# Patient Record
Sex: Female | Born: 1940 | Race: Black or African American | Hispanic: No | Marital: Married | Smoking: Current every day smoker
Health system: Southern US, Community
[De-identification: ages and names within clinical notes are randomized; demographics above are authoritative.]

## PROBLEM LIST (undated history)

## (undated) DIAGNOSIS — I10 Essential (primary) hypertension: Secondary | ICD-10-CM

## (undated) DIAGNOSIS — F0390 Unspecified dementia without behavioral disturbance: Secondary | ICD-10-CM

## (undated) DIAGNOSIS — I8290 Acute embolism and thrombosis of unspecified vein: Secondary | ICD-10-CM

## (undated) DIAGNOSIS — I219 Acute myocardial infarction, unspecified: Secondary | ICD-10-CM

## (undated) DIAGNOSIS — K219 Gastro-esophageal reflux disease without esophagitis: Secondary | ICD-10-CM

## (undated) DIAGNOSIS — E785 Hyperlipidemia, unspecified: Secondary | ICD-10-CM

## (undated) DIAGNOSIS — Z951 Presence of aortocoronary bypass graft: Secondary | ICD-10-CM

## (undated) DIAGNOSIS — I2510 Atherosclerotic heart disease of native coronary artery without angina pectoris: Secondary | ICD-10-CM

## (undated) HISTORY — PX: CARDIAC SURGERY: SHX584

## (undated) HISTORY — PX: SHOULDER SURGERY: SHX246

---

## 2002-06-19 ENCOUNTER — Inpatient Hospital Stay (HOSPITAL_COMMUNITY): Admission: EM | Admit: 2002-06-19 | Discharge: 2002-06-21 | Payer: Self-pay | Admitting: Cardiology

## 2002-06-20 ENCOUNTER — Encounter: Payer: Self-pay | Admitting: Cardiology

## 2002-11-26 ENCOUNTER — Other Ambulatory Visit: Payer: Self-pay

## 2003-01-03 ENCOUNTER — Other Ambulatory Visit: Payer: Self-pay

## 2003-10-13 ENCOUNTER — Other Ambulatory Visit: Payer: Self-pay

## 2004-02-02 ENCOUNTER — Emergency Department: Payer: Self-pay | Admitting: Emergency Medicine

## 2004-09-16 ENCOUNTER — Other Ambulatory Visit: Payer: Self-pay

## 2004-09-16 ENCOUNTER — Inpatient Hospital Stay: Payer: Self-pay | Admitting: Internal Medicine

## 2004-09-17 ENCOUNTER — Other Ambulatory Visit: Payer: Self-pay

## 2004-10-07 ENCOUNTER — Other Ambulatory Visit: Payer: Self-pay

## 2004-10-07 ENCOUNTER — Inpatient Hospital Stay: Payer: Self-pay | Admitting: Internal Medicine

## 2004-10-08 ENCOUNTER — Other Ambulatory Visit: Payer: Self-pay

## 2004-10-09 ENCOUNTER — Other Ambulatory Visit: Payer: Self-pay

## 2005-02-07 ENCOUNTER — Emergency Department: Payer: Self-pay | Admitting: Emergency Medicine

## 2005-06-09 ENCOUNTER — Other Ambulatory Visit: Payer: Self-pay

## 2005-06-09 ENCOUNTER — Emergency Department: Payer: Self-pay | Admitting: Emergency Medicine

## 2005-09-30 ENCOUNTER — Other Ambulatory Visit: Payer: Self-pay

## 2005-10-01 ENCOUNTER — Inpatient Hospital Stay: Payer: Self-pay | Admitting: Internal Medicine

## 2005-12-07 ENCOUNTER — Inpatient Hospital Stay: Payer: Self-pay | Admitting: Internal Medicine

## 2005-12-07 ENCOUNTER — Other Ambulatory Visit: Payer: Self-pay

## 2006-07-16 ENCOUNTER — Emergency Department: Payer: Self-pay | Admitting: Emergency Medicine

## 2006-07-16 ENCOUNTER — Other Ambulatory Visit: Payer: Self-pay

## 2006-09-08 ENCOUNTER — Emergency Department: Payer: Self-pay | Admitting: Emergency Medicine

## 2006-09-08 ENCOUNTER — Other Ambulatory Visit: Payer: Self-pay

## 2007-06-30 ENCOUNTER — Emergency Department: Payer: Self-pay | Admitting: Emergency Medicine

## 2007-07-09 ENCOUNTER — Ambulatory Visit: Payer: Self-pay | Admitting: Internal Medicine

## 2007-10-05 ENCOUNTER — Emergency Department: Payer: Self-pay | Admitting: Emergency Medicine

## 2007-10-05 ENCOUNTER — Other Ambulatory Visit: Payer: Self-pay

## 2008-09-04 ENCOUNTER — Ambulatory Visit: Payer: Self-pay | Admitting: Family Medicine

## 2009-02-25 ENCOUNTER — Ambulatory Visit: Payer: Self-pay | Admitting: Internal Medicine

## 2009-06-17 ENCOUNTER — Emergency Department: Payer: Self-pay | Admitting: Emergency Medicine

## 2009-07-14 ENCOUNTER — Ambulatory Visit: Payer: Self-pay | Admitting: Gastroenterology

## 2009-07-16 ENCOUNTER — Ambulatory Visit: Payer: Self-pay | Admitting: Gastroenterology

## 2009-09-09 ENCOUNTER — Ambulatory Visit: Payer: Self-pay | Admitting: Gastroenterology

## 2010-05-05 ENCOUNTER — Ambulatory Visit: Payer: Self-pay | Admitting: Family Medicine

## 2010-08-18 ENCOUNTER — Emergency Department: Payer: Self-pay | Admitting: Emergency Medicine

## 2010-10-07 ENCOUNTER — Ambulatory Visit: Payer: Self-pay | Admitting: Pain Medicine

## 2010-10-08 ENCOUNTER — Ambulatory Visit: Payer: Self-pay | Admitting: Pain Medicine

## 2010-10-20 ENCOUNTER — Ambulatory Visit: Payer: Self-pay | Admitting: Pain Medicine

## 2011-01-25 DIAGNOSIS — Z951 Presence of aortocoronary bypass graft: Secondary | ICD-10-CM

## 2011-01-25 HISTORY — DX: Presence of aortocoronary bypass graft: Z95.1

## 2011-02-08 LAB — URINALYSIS, COMPLETE
Blood: NEGATIVE
Ketone: NEGATIVE
Ph: 5 (ref 4.5–8.0)
Protein: NEGATIVE
RBC,UR: 1 /HPF (ref 0–5)
Specific Gravity: 1.021 (ref 1.003–1.030)
Squamous Epithelial: 1

## 2011-02-08 LAB — BASIC METABOLIC PANEL
Calcium, Total: 9 mg/dL (ref 8.5–10.1)
Chloride: 105 mmol/L (ref 98–107)
Co2: 24 mmol/L (ref 21–32)
EGFR (African American): 46 — ABNORMAL LOW
Osmolality: 288 (ref 275–301)

## 2011-02-08 LAB — CK TOTAL AND CKMB (NOT AT ARMC)
CK, Total: 59 U/L (ref 21–215)
CK, Total: 61 U/L (ref 21–215)
CK-MB: 0.6 ng/mL (ref 0.5–3.6)
CK-MB: 0.9 ng/mL (ref 0.5–3.6)

## 2011-02-08 LAB — CBC
HCT: 36.2 % (ref 35.0–47.0)
MCH: 33.6 pg (ref 26.0–34.0)
MCHC: 34.5 g/dL (ref 32.0–36.0)
MCV: 97 fL (ref 80–100)
Platelet: 239 10*3/uL (ref 150–440)
RDW: 11.9 % (ref 11.5–14.5)

## 2011-02-09 ENCOUNTER — Inpatient Hospital Stay: Payer: Self-pay | Admitting: Internal Medicine

## 2011-02-09 LAB — BASIC METABOLIC PANEL
Calcium, Total: 8.8 mg/dL (ref 8.5–10.1)
Co2: 26 mmol/L (ref 21–32)
Creatinine: 1.41 mg/dL — ABNORMAL HIGH (ref 0.60–1.30)
EGFR (African American): 47 — ABNORMAL LOW
EGFR (Non-African Amer.): 39 — ABNORMAL LOW
Glucose: 91 mg/dL (ref 65–99)
Osmolality: 283 (ref 275–301)
Sodium: 140 mmol/L (ref 136–145)

## 2011-02-09 LAB — CBC WITH DIFFERENTIAL/PLATELET
Basophil #: 0 10*3/uL (ref 0.0–0.1)
Basophil %: 0.6 %
Eosinophil %: 3.1 %
HGB: 10.4 g/dL — ABNORMAL LOW (ref 12.0–16.0)
Lymphocyte #: 1.4 10*3/uL (ref 1.0–3.6)
Lymphocyte %: 27.3 %
MCH: 33.5 pg (ref 26.0–34.0)
MCV: 99 fL (ref 80–100)
Monocyte %: 13.2 %
Neutrophil %: 55.8 %
Platelet: 195 10*3/uL (ref 150–440)
RBC: 3.12 10*6/uL — ABNORMAL LOW (ref 3.80–5.20)
RDW: 13.1 % (ref 11.5–14.5)

## 2011-02-09 LAB — MAGNESIUM: Magnesium: 2 mg/dL

## 2011-02-09 LAB — CK TOTAL AND CKMB (NOT AT ARMC)
CK, Total: 87 U/L
CK-MB: 1.1 ng/mL

## 2011-02-09 LAB — PROTIME-INR
INR: 0.9
Prothrombin Time: 12.4 secs (ref 11.5–14.7)

## 2011-02-09 LAB — TROPONIN I: Troponin-I: <0.02 ng/mL

## 2011-02-09 LAB — LIPID PANEL: Cholesterol: 139 mg/dL (ref 0–200)

## 2011-02-10 LAB — BASIC METABOLIC PANEL
Anion Gap: 11 (ref 7–16)
BUN: 19 mg/dL — ABNORMAL HIGH (ref 7–18)
Chloride: 107 mmol/L (ref 98–107)
Co2: 25 mmol/L (ref 21–32)
Creatinine: 1.22 mg/dL (ref 0.60–1.30)
EGFR (African American): 56 — ABNORMAL LOW
EGFR (Non-African Amer.): 46 — ABNORMAL LOW
Osmolality: 287 (ref 275–301)

## 2011-02-10 LAB — PROTIME-INR
INR: 0.9
Prothrombin Time: 12.7 secs (ref 11.5–14.7)

## 2011-02-11 LAB — BASIC METABOLIC PANEL
Anion Gap: 11 (ref 7–16)
Calcium, Total: 8.8 mg/dL (ref 8.5–10.1)
Chloride: 108 mmol/L — ABNORMAL HIGH (ref 98–107)
Co2: 27 mmol/L (ref 21–32)
Creatinine: 1.15 mg/dL (ref 0.60–1.30)
EGFR (African American): >60
Osmolality: 290 (ref 275–301)

## 2011-02-11 LAB — HEMOGLOBIN: HGB: 10.7 g/dL — ABNORMAL LOW (ref 12.0–16.0)

## 2011-03-25 ENCOUNTER — Ambulatory Visit: Payer: Self-pay | Admitting: Family Medicine

## 2011-05-12 ENCOUNTER — Ambulatory Visit: Payer: Self-pay | Admitting: Family Medicine

## 2011-05-17 ENCOUNTER — Encounter: Payer: Self-pay | Admitting: Internal Medicine

## 2011-05-25 ENCOUNTER — Encounter: Payer: Self-pay | Admitting: Internal Medicine

## 2011-06-25 ENCOUNTER — Encounter: Payer: Self-pay | Admitting: Internal Medicine

## 2011-07-25 ENCOUNTER — Encounter: Payer: Self-pay | Admitting: Internal Medicine

## 2011-08-18 ENCOUNTER — Emergency Department: Payer: Self-pay | Admitting: Emergency Medicine

## 2011-08-18 LAB — COMPREHENSIVE METABOLIC PANEL
Albumin: 4 g/dL (ref 3.4–5.0)
Anion Gap: 7 (ref 7–16)
BUN: 26 mg/dL — ABNORMAL HIGH (ref 7–18)
Calcium, Total: 9 mg/dL (ref 8.5–10.1)
Chloride: 107 mmol/L (ref 98–107)
EGFR (African American): 45 — ABNORMAL LOW
EGFR (Non-African Amer.): 39 — ABNORMAL LOW
Glucose: 95 mg/dL (ref 65–99)
Osmolality: 286 (ref 275–301)
Potassium: 4.3 mmol/L (ref 3.5–5.1)
SGOT(AST): 11 U/L — ABNORMAL LOW (ref 15–37)
Sodium: 141 mmol/L (ref 136–145)
Total Protein: 7.9 g/dL (ref 6.4–8.2)

## 2011-08-18 LAB — CBC
MCH: 32.4 pg (ref 26.0–34.0)
MCHC: 33.2 g/dL (ref 32.0–36.0)
Platelet: 199 10*3/uL (ref 150–440)

## 2011-08-18 LAB — URINALYSIS, COMPLETE
Blood: NEGATIVE
Ketone: NEGATIVE
Ph: 5 (ref 4.5–8.0)
Protein: NEGATIVE
Specific Gravity: 1.02 (ref 1.003–1.030)
WBC UR: 2 /HPF (ref 0–5)

## 2011-08-18 LAB — AMYLASE: Amylase: 36 U/L (ref 25–115)

## 2011-08-18 LAB — LIPASE, BLOOD: Lipase: 127 U/L (ref 73–393)

## 2011-08-25 ENCOUNTER — Encounter: Payer: Self-pay | Admitting: Internal Medicine

## 2011-09-16 ENCOUNTER — Emergency Department: Payer: Self-pay | Admitting: Emergency Medicine

## 2011-09-16 LAB — URINALYSIS, COMPLETE
Bacteria: NONE SEEN
Bilirubin,UR: NEGATIVE
Blood: NEGATIVE
Glucose,UR: NEGATIVE mg/dL (ref 0–75)
Ketone: NEGATIVE
Protein: NEGATIVE
RBC,UR: 2 /HPF (ref 0–5)
Specific Gravity: 1.019 (ref 1.003–1.030)
Transitional Epi: 1
WBC UR: 30 /HPF (ref 0–5)

## 2011-09-16 LAB — COMPREHENSIVE METABOLIC PANEL
Bilirubin,Total: 0.6 mg/dL (ref 0.2–1.0)
Calcium, Total: 8.9 mg/dL (ref 8.5–10.1)
Chloride: 107 mmol/L (ref 98–107)
Co2: 31 mmol/L (ref 21–32)
EGFR (African American): 44 — ABNORMAL LOW
EGFR (Non-African Amer.): 38 — ABNORMAL LOW
Potassium: 3.9 mmol/L (ref 3.5–5.1)
SGOT(AST): 16 U/L (ref 15–37)
Sodium: 142 mmol/L (ref 136–145)

## 2011-09-16 LAB — CBC
HCT: 37.4 % (ref 35.0–47.0)
MCH: 31.5 pg (ref 26.0–34.0)
MCHC: 32.6 g/dL (ref 32.0–36.0)
RBC: 3.87 10*6/uL (ref 3.80–5.20)
WBC: 5.4 10*3/uL (ref 3.6–11.0)

## 2011-09-17 LAB — URINE CULTURE

## 2011-10-28 ENCOUNTER — Inpatient Hospital Stay: Payer: Self-pay | Admitting: Internal Medicine

## 2011-10-28 LAB — TROPONIN I: Troponin-I: 0.03 ng/mL

## 2011-10-28 LAB — COMPREHENSIVE METABOLIC PANEL
Albumin: 4.1 g/dL (ref 3.4–5.0)
Alkaline Phosphatase: 129 U/L (ref 50–136)
BUN: 29 mg/dL — ABNORMAL HIGH (ref 7–18)
Creatinine: 1.96 mg/dL — ABNORMAL HIGH (ref 0.60–1.30)
EGFR (African American): 29 — ABNORMAL LOW
Glucose: 113 mg/dL — ABNORMAL HIGH (ref 65–99)
SGOT(AST): 20 U/L (ref 15–37)
SGPT (ALT): 17 U/L (ref 12–78)
Total Protein: 8 g/dL (ref 6.4–8.2)

## 2011-10-28 LAB — URINALYSIS, COMPLETE
Bilirubin,UR: NEGATIVE
Ketone: NEGATIVE
Leukocyte Esterase: NEGATIVE
Ph: 6 (ref 4.5–8.0)
RBC,UR: 3 /HPF (ref 0–5)
Squamous Epithelial: <1
WBC UR: NONE SEEN /HPF (ref 0–5)

## 2011-10-28 LAB — CBC
HCT: 41.9 % (ref 35.0–47.0)
HGB: 14.4 g/dL (ref 12.0–16.0)
MCH: 32.5 pg (ref 26.0–34.0)
MCHC: 34.2 g/dL (ref 32.0–36.0)
MCV: 95 fL (ref 80–100)
WBC: 3.7 10*3/uL (ref 3.6–11.0)

## 2011-10-28 LAB — CK TOTAL AND CKMB (NOT AT ARMC)
CK, Total: 350 U/L — ABNORMAL HIGH (ref 21–215)
CK-MB: 2.4 ng/mL (ref 0.5–3.6)

## 2011-10-28 LAB — PROTIME-INR
INR: 1
Prothrombin Time: 14 secs (ref 11.5–14.7)

## 2011-10-29 LAB — CBC WITH DIFFERENTIAL/PLATELET
Basophil #: 0 10*3/uL (ref 0.0–0.1)
Eosinophil #: 0.1 10*3/uL (ref 0.0–0.7)
HCT: 38.1 % (ref 35.0–47.0)
HGB: 13.5 g/dL (ref 12.0–16.0)
Lymphocyte #: 1.7 10*3/uL (ref 1.0–3.6)
Lymphocyte %: 39.6 %
MCHC: 35.5 g/dL (ref 32.0–36.0)
MCV: 95 fL (ref 80–100)
Monocyte #: 0.6 x10 3/mm (ref 0.2–0.9)
Monocyte %: 15.4 %
RBC: 4.03 10*6/uL (ref 3.80–5.20)
RDW: 13.2 % (ref 11.5–14.5)

## 2011-10-29 LAB — LIPID PANEL
Cholesterol: 151 mg/dL (ref 0–200)
HDL Cholesterol: 41 mg/dL (ref 40–60)
Ldl Cholesterol, Calc: 89 mg/dL (ref 0–100)
Triglycerides: 107 mg/dL (ref 0–200)
VLDL Cholesterol, Calc: 21 mg/dL (ref 5–40)

## 2011-10-29 LAB — MAGNESIUM: Magnesium: 2.2 mg/dL

## 2011-10-29 LAB — CK TOTAL AND CKMB (NOT AT ARMC): CK-MB: 3.7 ng/mL — ABNORMAL HIGH (ref 0.5–3.6)

## 2011-10-29 LAB — BASIC METABOLIC PANEL
BUN: 27 mg/dL — ABNORMAL HIGH (ref 7–18)
Co2: 30 mmol/L (ref 21–32)
Creatinine: 1.68 mg/dL — ABNORMAL HIGH (ref 0.60–1.30)
EGFR (African American): 35 — ABNORMAL LOW
EGFR (Non-African Amer.): 30 — ABNORMAL LOW
Glucose: 95 mg/dL (ref 65–99)
Potassium: 3.4 mmol/L — ABNORMAL LOW (ref 3.5–5.1)
Sodium: 142 mmol/L (ref 136–145)

## 2011-10-29 LAB — TROPONIN I: Troponin-I: 0.03 ng/mL

## 2011-10-30 LAB — BASIC METABOLIC PANEL
Anion Gap: 9 (ref 7–16)
BUN: 23 mg/dL — ABNORMAL HIGH (ref 7–18)
Chloride: 102 mmol/L (ref 98–107)
Creatinine: 1.76 mg/dL — ABNORMAL HIGH (ref 0.60–1.30)
EGFR (Non-African Amer.): 29 — ABNORMAL LOW
Glucose: 110 mg/dL — ABNORMAL HIGH (ref 65–99)
Osmolality: 282 (ref 275–301)
Potassium: 3.8 mmol/L (ref 3.5–5.1)

## 2011-11-27 ENCOUNTER — Emergency Department: Payer: Self-pay | Admitting: Emergency Medicine

## 2011-11-27 LAB — COMPREHENSIVE METABOLIC PANEL
Alkaline Phosphatase: 116 U/L (ref 50–136)
Bilirubin,Total: 0.3 mg/dL (ref 0.2–1.0)
Calcium, Total: 8.7 mg/dL (ref 8.5–10.1)
Chloride: 110 mmol/L — ABNORMAL HIGH (ref 98–107)
Co2: 27 mmol/L (ref 21–32)
Creatinine: 1.34 mg/dL — ABNORMAL HIGH (ref 0.60–1.30)
EGFR (African American): 46 — ABNORMAL LOW
EGFR (Non-African Amer.): 40 — ABNORMAL LOW
Glucose: 92 mg/dL (ref 65–99)
Osmolality: 286 (ref 275–301)
SGPT (ALT): 15 U/L (ref 12–78)
Sodium: 143 mmol/L (ref 136–145)

## 2011-11-27 LAB — CK TOTAL AND CKMB (NOT AT ARMC): CK, Total: 68 U/L (ref 21–215)

## 2011-11-27 LAB — CBC
MCHC: 33.7 g/dL (ref 32.0–36.0)
MCV: 98 fL (ref 80–100)
Platelet: 188 10*3/uL (ref 150–440)
RBC: 3.96 10*6/uL (ref 3.80–5.20)
RDW: 13.8 % (ref 11.5–14.5)

## 2011-11-27 LAB — PRO B NATRIURETIC PEPTIDE: B-Type Natriuretic Peptide: 2894 pg/mL — ABNORMAL HIGH (ref 0–125)

## 2011-11-27 LAB — TROPONIN I
Troponin-I: <0.02 ng/mL
Troponin-I: <0.02 ng/mL

## 2011-12-10 ENCOUNTER — Observation Stay: Payer: Self-pay | Admitting: Internal Medicine

## 2011-12-10 LAB — CBC
HCT: 37.4 % (ref 35.0–47.0)
HGB: 12.6 g/dL (ref 12.0–16.0)
MCV: 98 fL (ref 80–100)
Platelet: 176 10*3/uL (ref 150–440)
RBC: 3.81 10*6/uL (ref 3.80–5.20)
RDW: 14 % (ref 11.5–14.5)
WBC: 3.2 10*3/uL — ABNORMAL LOW (ref 3.6–11.0)

## 2011-12-10 LAB — BASIC METABOLIC PANEL
BUN: 19 mg/dL — ABNORMAL HIGH (ref 7–18)
Calcium, Total: 8.7 mg/dL (ref 8.5–10.1)
Co2: 26 mmol/L (ref 21–32)
Creatinine: 1.32 mg/dL — ABNORMAL HIGH (ref 0.60–1.30)
EGFR (African American): 47 — ABNORMAL LOW
EGFR (Non-African Amer.): 41 — ABNORMAL LOW
Glucose: 94 mg/dL (ref 65–99)
Osmolality: 283 (ref 275–301)
Sodium: 141 mmol/L (ref 136–145)

## 2011-12-10 LAB — TROPONIN I
Troponin-I: <0.02 ng/mL
Troponin-I: <0.02 ng/mL
Troponin-I: <0.02 ng/mL

## 2011-12-10 LAB — CK TOTAL AND CKMB (NOT AT ARMC)
CK, Total: 63 U/L (ref 21–215)
CK, Total: 64 U/L (ref 21–215)
CK-MB: 0.9 ng/mL (ref 0.5–3.6)
CK-MB: 1 ng/mL (ref 0.5–3.6)
CK-MB: 1.3 ng/mL (ref 0.5–3.6)

## 2011-12-10 LAB — PRO B NATRIURETIC PEPTIDE: B-Type Natriuretic Peptide: 1451 pg/mL — ABNORMAL HIGH (ref 0–125)

## 2012-05-05 ENCOUNTER — Emergency Department: Payer: Self-pay | Admitting: Emergency Medicine

## 2012-05-06 LAB — CBC
HGB: 12.3 g/dL (ref 12.0–16.0)
MCH: 32 pg (ref 26.0–34.0)
MCHC: 32.2 g/dL (ref 32.0–36.0)
Platelet: 189 10*3/uL (ref 150–440)
WBC: 3.6 10*3/uL (ref 3.6–11.0)

## 2012-05-06 LAB — URINALYSIS, COMPLETE
Glucose,UR: NEGATIVE mg/dL (ref 0–75)
Ketone: NEGATIVE
Nitrite: NEGATIVE
Ph: 5 (ref 4.5–8.0)
Protein: NEGATIVE
RBC,UR: 2 /HPF (ref 0–5)
Squamous Epithelial: 2
WBC UR: 30 /HPF (ref 0–5)

## 2012-05-06 LAB — COMPREHENSIVE METABOLIC PANEL
Alkaline Phosphatase: 83 U/L (ref 50–136)
Anion Gap: 8 (ref 7–16)
Bilirubin,Total: 0.4 mg/dL (ref 0.2–1.0)
Calcium, Total: 8.6 mg/dL (ref 8.5–10.1)
Co2: 25 mmol/L (ref 21–32)
Osmolality: 284 (ref 275–301)
Potassium: 4 mmol/L (ref 3.5–5.1)
SGOT(AST): 13 U/L — ABNORMAL LOW (ref 15–37)
Sodium: 141 mmol/L (ref 136–145)
Total Protein: 7 g/dL (ref 6.4–8.2)

## 2012-05-18 ENCOUNTER — Ambulatory Visit: Payer: Self-pay | Admitting: Family Medicine

## 2012-06-13 ENCOUNTER — Observation Stay: Payer: Self-pay | Admitting: Internal Medicine

## 2012-06-13 LAB — CK TOTAL AND CKMB (NOT AT ARMC)
CK, Total: 110 U/L (ref 21–215)
CK-MB: <0.5 ng/mL — ABNORMAL LOW (ref 0.5–3.6)
CK-MB: <0.5 ng/mL — ABNORMAL LOW (ref 0.5–3.6)

## 2012-06-13 LAB — CBC
HCT: 36 % (ref 35.0–47.0)
MCHC: 33.6 g/dL (ref 32.0–36.0)
MCV: 97 fL (ref 80–100)
Platelet: 190 10*3/uL (ref 150–440)
RDW: 13 % (ref 11.5–14.5)
WBC: 3.8 10*3/uL (ref 3.6–11.0)

## 2012-06-13 LAB — COMPREHENSIVE METABOLIC PANEL
Alkaline Phosphatase: 81 U/L (ref 50–136)
Anion Gap: 7 (ref 7–16)
Bilirubin,Total: 0.5 mg/dL (ref 0.2–1.0)
Calcium, Total: 8.6 mg/dL (ref 8.5–10.1)
Chloride: 107 mmol/L (ref 98–107)
Co2: 26 mmol/L (ref 21–32)
Creatinine: 1.46 mg/dL — ABNORMAL HIGH (ref 0.60–1.30)
EGFR (African American): 42 — ABNORMAL LOW
EGFR (Non-African Amer.): 36 — ABNORMAL LOW
Glucose: 102 mg/dL — ABNORMAL HIGH (ref 65–99)
Osmolality: 283 (ref 275–301)
SGOT(AST): 15 U/L (ref 15–37)
SGPT (ALT): 14 U/L (ref 12–78)

## 2012-06-13 LAB — TROPONIN I
Troponin-I: <0.02 ng/mL
Troponin-I: <0.02 ng/mL

## 2012-06-14 LAB — BASIC METABOLIC PANEL
Anion Gap: 6 — ABNORMAL LOW (ref 7–16)
BUN: 23 mg/dL — ABNORMAL HIGH (ref 7–18)
Calcium, Total: 9 mg/dL (ref 8.5–10.1)
Chloride: 108 mmol/L — ABNORMAL HIGH (ref 98–107)
Creatinine: 1.49 mg/dL — ABNORMAL HIGH (ref 0.60–1.30)
EGFR (Non-African Amer.): 35 — ABNORMAL LOW
Glucose: 89 mg/dL (ref 65–99)
Potassium: 3.9 mmol/L (ref 3.5–5.1)
Sodium: 140 mmol/L (ref 136–145)

## 2012-06-14 LAB — TROPONIN I: Troponin-I: <0.02 ng/mL

## 2012-06-14 LAB — CK TOTAL AND CKMB (NOT AT ARMC)
CK, Total: 107 U/L (ref 21–215)
CK-MB: 0.7 ng/mL (ref 0.5–3.6)

## 2012-06-14 LAB — LIPID PANEL
Ldl Cholesterol, Calc: 90 mg/dL (ref 0–100)
Triglycerides: 59 mg/dL (ref 0–200)

## 2012-08-01 ENCOUNTER — Ambulatory Visit: Payer: Self-pay | Admitting: Family Medicine

## 2012-10-18 ENCOUNTER — Observation Stay: Payer: Self-pay | Admitting: Internal Medicine

## 2012-10-18 LAB — CBC
HGB: 11.9 g/dL — ABNORMAL LOW (ref 12.0–16.0)
MCH: 32.8 pg (ref 26.0–34.0)
MCV: 97 fL (ref 80–100)
Platelet: 212 10*3/uL (ref 150–440)
RBC: 3.63 10*6/uL — ABNORMAL LOW (ref 3.80–5.20)

## 2012-10-18 LAB — COMPREHENSIVE METABOLIC PANEL
Bilirubin,Total: 0.4 mg/dL (ref 0.2–1.0)
Calcium, Total: 9 mg/dL (ref 8.5–10.1)
Chloride: 109 mmol/L — ABNORMAL HIGH (ref 98–107)
Glucose: 99 mg/dL (ref 65–99)
Osmolality: 281 (ref 275–301)
SGOT(AST): 19 U/L (ref 15–37)
SGPT (ALT): 13 U/L (ref 12–78)
Sodium: 140 mmol/L (ref 136–145)
Total Protein: 6.9 g/dL (ref 6.4–8.2)

## 2012-10-18 LAB — TROPONIN I: Troponin-I: <0.02 ng/mL

## 2012-10-18 LAB — T4, FREE: Free Thyroxine: 1.21 ng/dL (ref 0.76–1.46)

## 2012-10-19 LAB — CK TOTAL AND CKMB (NOT AT ARMC)
CK-MB: 0.7 ng/mL (ref 0.5–3.6)
CK-MB: 0.7 ng/mL (ref 0.5–3.6)

## 2012-10-19 LAB — TROPONIN I
Troponin-I: <0.02 ng/mL
Troponin-I: <0.02 ng/mL

## 2012-10-19 LAB — LIPID PANEL: HDL Cholesterol: 44 mg/dL (ref 40–60)

## 2012-11-06 ENCOUNTER — Inpatient Hospital Stay: Payer: Self-pay | Admitting: Family Medicine

## 2012-11-06 LAB — BASIC METABOLIC PANEL
Anion Gap: 5 — ABNORMAL LOW (ref 7–16)
BUN: 18 mg/dL (ref 7–18)
Calcium, Total: 8.9 mg/dL (ref 8.5–10.1)
Osmolality: 278 (ref 275–301)
Potassium: 3.9 mmol/L (ref 3.5–5.1)
Sodium: 138 mmol/L (ref 136–145)

## 2012-11-06 LAB — CBC
HCT: 35.4 % (ref 35.0–47.0)
HGB: 12.1 g/dL (ref 12.0–16.0)
Platelet: 205 10*3/uL (ref 150–440)
RBC: 3.66 10*6/uL — ABNORMAL LOW (ref 3.80–5.20)

## 2012-11-06 LAB — TROPONIN I
Troponin-I: <0.02 ng/mL
Troponin-I: <0.02 ng/mL

## 2012-11-06 LAB — PROTIME-INR
INR: 1
Prothrombin Time: 13.3 secs (ref 11.5–14.7)

## 2012-11-06 LAB — CK TOTAL AND CKMB (NOT AT ARMC)
CK-MB: 0.8 ng/mL (ref 0.5–3.6)
CK-MB: 0.8 ng/mL (ref 0.5–3.6)

## 2012-11-07 LAB — CK TOTAL AND CKMB (NOT AT ARMC)
CK, Total: 46 U/L
CK-MB: 0.8 ng/mL

## 2012-11-07 LAB — TROPONIN I: Troponin-I: <0.02 ng/mL

## 2012-11-08 LAB — BASIC METABOLIC PANEL
Anion Gap: 2 — ABNORMAL LOW (ref 7–16)
EGFR (African American): 38 — ABNORMAL LOW
EGFR (Non-African Amer.): 33 — ABNORMAL LOW
Osmolality: 278 (ref 275–301)

## 2012-11-09 LAB — BASIC METABOLIC PANEL WITH GFR
Anion Gap: 4 — ABNORMAL LOW
BUN: 13 mg/dL
Calcium, Total: 8.3 mg/dL — ABNORMAL LOW
Chloride: 104 mmol/L
Co2: 31 mmol/L
Creatinine: 1.28 mg/dL
EGFR (African American): 49 — ABNORMAL LOW
EGFR (Non-African Amer.): 42 — ABNORMAL LOW
Glucose: 95 mg/dL
Osmolality: 277
Potassium: 3.9 mmol/L
Sodium: 139 mmol/L

## 2012-11-09 LAB — CK TOTAL AND CKMB (NOT AT ARMC)
CK, Total: 85 U/L
CK-MB: 1.5 ng/mL

## 2013-05-06 ENCOUNTER — Emergency Department: Payer: Self-pay | Admitting: Emergency Medicine

## 2013-05-06 LAB — TROPONIN I

## 2013-05-06 LAB — CBC WITH DIFFERENTIAL/PLATELET
BASOS ABS: 0 10*3/uL (ref 0.0–0.1)
Basophil %: 0.9 %
Eosinophil #: 0.1 10*3/uL (ref 0.0–0.7)
Eosinophil %: 2 %
HCT: 39.2 % (ref 35.0–47.0)
HGB: 13.2 g/dL (ref 12.0–16.0)
Lymphocyte #: 1.3 10*3/uL (ref 1.0–3.6)
Lymphocyte %: 34.1 %
MCH: 32.8 pg (ref 26.0–34.0)
MCHC: 33.6 g/dL (ref 32.0–36.0)
MCV: 98 fL (ref 80–100)
MONO ABS: 0.5 x10 3/mm (ref 0.2–0.9)
Monocyte %: 13.7 %
Neutrophil #: 1.9 10*3/uL (ref 1.4–6.5)
Neutrophil %: 49.3 %
PLATELETS: 232 10*3/uL (ref 150–440)
RBC: 4.02 10*6/uL (ref 3.80–5.20)
RDW: 13.8 % (ref 11.5–14.5)
WBC: 3.8 10*3/uL (ref 3.6–11.0)

## 2013-05-06 LAB — COMPREHENSIVE METABOLIC PANEL
ALBUMIN: 3.8 g/dL (ref 3.4–5.0)
ALT: 15 U/L (ref 12–78)
ANION GAP: 6 — AB (ref 7–16)
Alkaline Phosphatase: 84 U/L
BUN: 29 mg/dL — AB (ref 7–18)
Bilirubin,Total: 0.4 mg/dL (ref 0.2–1.0)
CALCIUM: 9 mg/dL (ref 8.5–10.1)
CHLORIDE: 104 mmol/L (ref 98–107)
CREATININE: 1.59 mg/dL — AB (ref 0.60–1.30)
Co2: 27 mmol/L (ref 21–32)
EGFR (Non-African Amer.): 32 — ABNORMAL LOW
GFR CALC AF AMER: 37 — AB
GLUCOSE: 109 mg/dL — AB (ref 65–99)
Osmolality: 280 (ref 275–301)
Potassium: 3.9 mmol/L (ref 3.5–5.1)
SGOT(AST): 20 U/L (ref 15–37)
Sodium: 137 mmol/L (ref 136–145)
Total Protein: 7.4 g/dL (ref 6.4–8.2)

## 2013-05-06 LAB — LIPASE, BLOOD: Lipase: 125 U/L (ref 73–393)

## 2013-05-16 ENCOUNTER — Emergency Department: Payer: Self-pay | Admitting: Internal Medicine

## 2013-05-16 LAB — COMPREHENSIVE METABOLIC PANEL
ALK PHOS: 83 U/L
AST: 17 U/L (ref 15–37)
Albumin: 3.7 g/dL (ref 3.4–5.0)
Anion Gap: 4 — ABNORMAL LOW (ref 7–16)
BUN: 17 mg/dL (ref 7–18)
Bilirubin,Total: 0.6 mg/dL (ref 0.2–1.0)
CHLORIDE: 106 mmol/L (ref 98–107)
Calcium, Total: 8.7 mg/dL (ref 8.5–10.1)
Co2: 27 mmol/L (ref 21–32)
Creatinine: 1.24 mg/dL (ref 0.60–1.30)
GFR CALC AF AMER: 50 — AB
GFR CALC NON AF AMER: 43 — AB
Glucose: 98 mg/dL (ref 65–99)
OSMOLALITY: 275 (ref 275–301)
POTASSIUM: 4.1 mmol/L (ref 3.5–5.1)
SGPT (ALT): 14 U/L (ref 12–78)
SODIUM: 137 mmol/L (ref 136–145)
Total Protein: 7.4 g/dL (ref 6.4–8.2)

## 2013-05-16 LAB — URINALYSIS, COMPLETE
Bacteria: NONE SEEN
Bilirubin,UR: NEGATIVE
Blood: NEGATIVE
Glucose,UR: NEGATIVE mg/dL (ref 0–75)
Hyaline Cast: 8
KETONE: NEGATIVE
Nitrite: NEGATIVE
Ph: 5 (ref 4.5–8.0)
Protein: NEGATIVE
RBC,UR: NONE SEEN /HPF (ref 0–5)
Specific Gravity: 1.006 (ref 1.003–1.030)
Squamous Epithelial: 1
WBC UR: 1 /HPF (ref 0–5)

## 2013-05-16 LAB — TROPONIN I: Troponin-I: <0.02 ng/mL

## 2013-05-16 LAB — CBC
HCT: 36.5 % (ref 35.0–47.0)
HGB: 12.4 g/dL (ref 12.0–16.0)
MCH: 33.1 pg (ref 26.0–34.0)
MCHC: 33.9 g/dL (ref 32.0–36.0)
MCV: 98 fL (ref 80–100)
PLATELETS: 208 10*3/uL (ref 150–440)
RBC: 3.73 10*6/uL — ABNORMAL LOW (ref 3.80–5.20)
RDW: 13.4 % (ref 11.5–14.5)
WBC: 3.7 10*3/uL (ref 3.6–11.0)

## 2013-06-04 ENCOUNTER — Ambulatory Visit: Payer: Self-pay | Admitting: Family Medicine

## 2013-06-08 ENCOUNTER — Inpatient Hospital Stay: Payer: Self-pay | Admitting: Internal Medicine

## 2013-06-08 LAB — COMPREHENSIVE METABOLIC PANEL
ALBUMIN: 3.5 g/dL (ref 3.4–5.0)
ANION GAP: 7 (ref 7–16)
Alkaline Phosphatase: 82 U/L
BILIRUBIN TOTAL: 0.5 mg/dL (ref 0.2–1.0)
BUN: 18 mg/dL (ref 7–18)
CHLORIDE: 107 mmol/L (ref 98–107)
Calcium, Total: 9.1 mg/dL (ref 8.5–10.1)
Co2: 24 mmol/L (ref 21–32)
Creatinine: 1.36 mg/dL — ABNORMAL HIGH (ref 0.60–1.30)
EGFR (African American): 45 — ABNORMAL LOW
EGFR (Non-African Amer.): 39 — ABNORMAL LOW
Glucose: 109 mg/dL — ABNORMAL HIGH (ref 65–99)
OSMOLALITY: 278 (ref 275–301)
POTASSIUM: 3.9 mmol/L (ref 3.5–5.1)
SGOT(AST): 22 U/L (ref 15–37)
SGPT (ALT): 15 U/L (ref 12–78)
SODIUM: 138 mmol/L (ref 136–145)
TOTAL PROTEIN: 7.1 g/dL (ref 6.4–8.2)

## 2013-06-08 LAB — URINALYSIS, COMPLETE
BACTERIA: NONE SEEN
BILIRUBIN, UR: NEGATIVE
BLOOD: NEGATIVE
Glucose,UR: NEGATIVE mg/dL (ref 0–75)
KETONE: NEGATIVE
Nitrite: NEGATIVE
Ph: 5 (ref 4.5–8.0)
RBC,UR: 4 /HPF (ref 0–5)
Specific Gravity: 1.024 (ref 1.003–1.030)
Squamous Epithelial: 11
WBC UR: 52 /HPF (ref 0–5)

## 2013-06-08 LAB — LIPASE, BLOOD: Lipase: 76 U/L (ref 73–393)

## 2013-06-08 LAB — TSH: THYROID STIMULATING HORM: 0.359 u[IU]/mL — AB

## 2013-06-08 LAB — CBC
HCT: 37.9 % (ref 35.0–47.0)
HGB: 12.7 g/dL (ref 12.0–16.0)
MCH: 32.9 pg (ref 26.0–34.0)
MCHC: 33.4 g/dL (ref 32.0–36.0)
MCV: 99 fL (ref 80–100)
PLATELETS: 192 10*3/uL (ref 150–440)
RBC: 3.85 10*6/uL (ref 3.80–5.20)
RDW: 13.4 % (ref 11.5–14.5)
WBC: 3.7 10*3/uL (ref 3.6–11.0)

## 2013-06-08 LAB — TROPONIN I: Troponin-I: 0.02 ng/mL

## 2013-06-09 LAB — COMPREHENSIVE METABOLIC PANEL
ANION GAP: 6 — AB (ref 7–16)
Albumin: 3 g/dL — ABNORMAL LOW (ref 3.4–5.0)
Alkaline Phosphatase: 65 U/L
BILIRUBIN TOTAL: 0.3 mg/dL (ref 0.2–1.0)
BUN: 14 mg/dL (ref 7–18)
CREATININE: 1.36 mg/dL — AB (ref 0.60–1.30)
Calcium, Total: 8.4 mg/dL — ABNORMAL LOW (ref 8.5–10.1)
Chloride: 109 mmol/L — ABNORMAL HIGH (ref 98–107)
Co2: 26 mmol/L (ref 21–32)
EGFR (African American): 45 — ABNORMAL LOW
EGFR (Non-African Amer.): 39 — ABNORMAL LOW
Glucose: 81 mg/dL (ref 65–99)
Osmolality: 281 (ref 275–301)
POTASSIUM: 4 mmol/L (ref 3.5–5.1)
SGOT(AST): 21 U/L (ref 15–37)
SGPT (ALT): 15 U/L (ref 12–78)
SODIUM: 141 mmol/L (ref 136–145)
Total Protein: 6.1 g/dL — ABNORMAL LOW (ref 6.4–8.2)

## 2013-06-09 LAB — TROPONIN I
TROPONIN-I: 0.03 ng/mL
Troponin-I: 0.03 ng/mL
Troponin-I: <0.02 ng/mL

## 2013-06-10 LAB — URINE CULTURE

## 2013-06-11 LAB — COMPREHENSIVE METABOLIC PANEL
ALBUMIN: 3.1 g/dL — AB (ref 3.4–5.0)
ALK PHOS: 61 U/L
ANION GAP: 4 — AB (ref 7–16)
AST: 23 U/L (ref 15–37)
BUN: 13 mg/dL (ref 7–18)
Bilirubin,Total: 0.3 mg/dL (ref 0.2–1.0)
CALCIUM: 8.4 mg/dL — AB (ref 8.5–10.1)
CHLORIDE: 106 mmol/L (ref 98–107)
CREATININE: 1.25 mg/dL (ref 0.60–1.30)
Co2: 27 mmol/L (ref 21–32)
EGFR (Non-African Amer.): 43 — ABNORMAL LOW
GFR CALC AF AMER: 50 — AB
Glucose: 134 mg/dL — ABNORMAL HIGH (ref 65–99)
Osmolality: 276 (ref 275–301)
Potassium: 4.2 mmol/L (ref 3.5–5.1)
SGPT (ALT): 18 U/L (ref 12–78)
Sodium: 137 mmol/L (ref 136–145)
Total Protein: 6 g/dL — ABNORMAL LOW (ref 6.4–8.2)

## 2013-06-13 LAB — T4, FREE: Free Thyroxine: 1.08 ng/dL (ref 0.76–1.46)

## 2013-06-13 LAB — PATHOLOGY REPORT

## 2013-06-13 LAB — TSH: Thyroid Stimulating Horm: 2.36 u[IU]/mL

## 2013-06-20 ENCOUNTER — Ambulatory Visit: Payer: Self-pay | Admitting: Gastroenterology

## 2013-06-25 ENCOUNTER — Ambulatory Visit: Payer: Self-pay | Admitting: Family Medicine

## 2013-06-29 ENCOUNTER — Ambulatory Visit: Payer: Self-pay | Admitting: Family Medicine

## 2013-07-18 ENCOUNTER — Ambulatory Visit: Payer: Self-pay | Admitting: Gastroenterology

## 2013-08-03 ENCOUNTER — Emergency Department: Payer: Self-pay | Admitting: Emergency Medicine

## 2013-08-03 LAB — CK TOTAL AND CKMB (NOT AT ARMC): CK, Total: 38 U/L

## 2013-08-03 LAB — COMPREHENSIVE METABOLIC PANEL
ALBUMIN: 3.3 g/dL — AB (ref 3.4–5.0)
AST: 6 U/L — AB (ref 15–37)
Alkaline Phosphatase: 78 U/L
Anion Gap: 7 (ref 7–16)
BILIRUBIN TOTAL: 0.3 mg/dL (ref 0.2–1.0)
BUN: 19 mg/dL — ABNORMAL HIGH (ref 7–18)
CALCIUM: 8.4 mg/dL — AB (ref 8.5–10.1)
Chloride: 105 mmol/L (ref 98–107)
Co2: 27 mmol/L (ref 21–32)
Creatinine: 1.39 mg/dL — ABNORMAL HIGH (ref 0.60–1.30)
EGFR (Non-African Amer.): 38 — ABNORMAL LOW
GFR CALC AF AMER: 44 — AB
GLUCOSE: 102 mg/dL — AB (ref 65–99)
Osmolality: 280 (ref 275–301)
Potassium: 4.2 mmol/L (ref 3.5–5.1)
SGPT (ALT): 10 U/L — ABNORMAL LOW (ref 12–78)
Sodium: 139 mmol/L (ref 136–145)
TOTAL PROTEIN: 6.6 g/dL (ref 6.4–8.2)

## 2013-08-03 LAB — CBC
HCT: 35.6 % (ref 35.0–47.0)
HGB: 11.7 g/dL — AB (ref 12.0–16.0)
MCH: 33.2 pg (ref 26.0–34.0)
MCHC: 33 g/dL (ref 32.0–36.0)
MCV: 101 fL — AB (ref 80–100)
Platelet: 213 10*3/uL (ref 150–440)
RBC: 3.53 10*6/uL — AB (ref 3.80–5.20)
RDW: 13.1 % (ref 11.5–14.5)
WBC: 3.3 10*3/uL — ABNORMAL LOW (ref 3.6–11.0)

## 2013-08-03 LAB — TROPONIN I
Troponin-I: <0.02 ng/mL
Troponin-I: <0.02 ng/mL

## 2013-09-18 ENCOUNTER — Ambulatory Visit: Payer: Self-pay | Admitting: Ophthalmology

## 2013-10-23 ENCOUNTER — Ambulatory Visit: Payer: Self-pay | Admitting: Ophthalmology

## 2013-12-17 ENCOUNTER — Emergency Department: Payer: Self-pay | Admitting: Emergency Medicine

## 2013-12-17 LAB — CBC
HCT: 39.1 % (ref 35.0–47.0)
HGB: 12.6 g/dL (ref 12.0–16.0)
MCH: 32.4 pg (ref 26.0–34.0)
MCHC: 32.2 g/dL (ref 32.0–36.0)
MCV: 101 fL — ABNORMAL HIGH (ref 80–100)
PLATELETS: 209 10*3/uL (ref 150–440)
RBC: 3.88 10*6/uL (ref 3.80–5.20)
RDW: 13 % (ref 11.5–14.5)
WBC: 4.1 10*3/uL (ref 3.6–11.0)

## 2013-12-17 LAB — COMPREHENSIVE METABOLIC PANEL
ALBUMIN: 3.7 g/dL (ref 3.4–5.0)
Alkaline Phosphatase: 92 U/L
Anion Gap: 4 — ABNORMAL LOW (ref 7–16)
BUN: 27 mg/dL — AB (ref 7–18)
Bilirubin,Total: 0.7 mg/dL (ref 0.2–1.0)
CHLORIDE: 105 mmol/L (ref 98–107)
CO2: 29 mmol/L (ref 21–32)
Calcium, Total: 8.6 mg/dL (ref 8.5–10.1)
Creatinine: 1.66 mg/dL — ABNORMAL HIGH (ref 0.60–1.30)
EGFR (African American): 39 — ABNORMAL LOW
GFR CALC NON AF AMER: 32 — AB
Glucose: 105 mg/dL — ABNORMAL HIGH (ref 65–99)
Osmolality: 281 (ref 275–301)
Potassium: 4.8 mmol/L (ref 3.5–5.1)
SGOT(AST): 20 U/L (ref 15–37)
SGPT (ALT): 18 U/L
SODIUM: 138 mmol/L (ref 136–145)
Total Protein: 7.2 g/dL (ref 6.4–8.2)

## 2013-12-17 LAB — PROTIME-INR
INR: 1
Prothrombin Time: 13.2 secs (ref 11.5–14.7)

## 2013-12-17 LAB — TROPONIN I

## 2013-12-17 LAB — APTT: Activated PTT: 26.4 secs (ref 23.6–35.9)

## 2014-01-31 ENCOUNTER — Ambulatory Visit: Payer: Self-pay | Admitting: Family Medicine

## 2014-03-25 ENCOUNTER — Emergency Department: Payer: Self-pay | Admitting: Internal Medicine

## 2014-05-13 NOTE — Consult Note (Signed)
PATIENT NAME:  [name], [name] MR#:  [id number] DATE OF BIRTH:  [birth year]  DATE OF CONSULTATION:  10/28/2011  REFERRING PHYSICIAN:   CONSULTING PHYSICIAN:  Dwayne D. Juliann Paresallwood, MD  PRIMARY CARE PHYSICIAN: Dr. Quillian QuinceBliss   INDICATION: Unstable angina and chest pain.   HISTORY OF PRESENT ILLNESS: Ms. Cynthia [name] is a 74 year old African American female with known history coronary artery disease, coronary bypass in January 2013, chronic renal insufficiency, transient ischemic attack, hypertension, borderline diabetes, hyperlipidemia presented with recurrent chest pain, angina, abnormal EKG with subtle ST changes. [facility] but now presents with recurrent chest pain, brought to the Emergency Room, treated with nitroglycerin x2 and she has had worsening symptoms of chest pain 5/10, initially was 10/10 but now 5/10 and got progressively worse, finally came to the Emergency Room.   REVIEW OF SYSTEMS: No blackout spells, syncope. No nausea, vomiting. No fever. No chills. No sweats. No weight loss. No weight gain, no hemoptysis, no hematemesis. No bright red blood per rectum. No vision change. No hearing change. Denies sputum production or cough.   PAST MEDICAL HISTORY:  1. Coronary artery disease. 2. Hypertension. 3. Hyperlipidemia.  4. Transient ischemic attack. 5. Chronic renal insufficiency.   PAST SURGICAL HISTORY:  1. Coronary artery stenting. 2. Left carotid endarterectomy.  3. Coronary artery bypass surgery.   MEDICATIONS:  1. Aspirin 81 mg a day.  2. Lasix 40 mg a day.  3. Metoprolol 12.5 twice a day.  4. Omeprazole 40 a day. 5. Percocet 10/650, 1 to 2 p.r.n.  6. Pravastatin 20 a day.  7. Promethazine 25 mg q.8 hours p.r.n.   ALLERGIES: Penicillin.   FAMILY HISTORY: Hypertension.   SOCIAL HISTORY: Ex-smoker. Retired. Lives with her husband. On disability. No alcohol consumption.   PHYSICAL EXAMINATION:  VITAL SIGNS: Blood pressure was elevated at 140/87, pulse  70, respiratory rate 16, afebrile.   HEENT: Normocephalic, atraumatic. Pupils reactive to light.   NECK: Supple. No jugular venous distention, bruits, adenopathy.   LUNGS: Clear to auscultation and percussion. No significant wheezing, rhonchi, rales.   HEART: Regular rate and rhythm. Positive bowel sounds. No rebound, guarding, tenderness.   EXTREMITIES: Within normal limits.   NEUROLOGIC: Examination is intact.   SKIN: Exam is normal.   LABORATORY, DIAGNOSTIC AND RADIOLOGICAL DATA: Creatinine 1.97, BUN 29, glucose 113, potassium 3.6, sodium 139. LFTs normal. Troponin negative. CK 350, platelet count 232. EKG: Normal sinus rhythm, right bundle branch block, subtle ST segment changes.   ASSESSMENT:  1. Unstable angina.  2. Coronary artery disease. 3. Renal insufficiency.  4. Hyperlipidemia.  5. Abnormal EKG. 6. Transient ischemic attack.  7. Peripheral vascular disease.   PLAN: Recommend direct cardiac catheterization for possible acute coronary syndrome. Hypertension control. Renal evaluation by nephrology. Will probably treat with bicarbonate and hydration prior to cardiac catheterization. Continue anticoagulation. Follow up with echocardiogram. Continue statin therapy. Will base further evaluation on results of the catheterization.  ____________________________ Bobbie Stackwayne D. Juliann Paresallwood, MD ddc:cms D: 10/29/2011 11:28:00 ET T: 10/29/2011 11:40:48 ET JOB#: [id number]  cc: Dwayne D. Juliann Paresallwood, MD, <Dictator> Alwyn PeaWAYNE D CALLWOOD MD ELECTRONICALLY SIGNED 12/02/2011 11:24

## 2014-05-13 NOTE — Discharge Summary (Signed)
PATIENT NAME:  Cynthia [name], Cynthia [name] MR#:  [id number] DATE OF BIRTH:  Sep 26, 1940  DATE OF ADMISSION:  10/28/2011 DATE OF DISCHARGE:  10/30/2011  DISCHARGE DIAGNOSES:  1. Unstable angina. 2. History of coronary artery disease. 3. Hypertension,  4. Hyperlipidemia.  5. Hypokalemia. 6. Chronic kidney disease. 7. Ischemic cardiomyopathy, ejection fraction 25%.   DISPOSITION: Patient is being discharged home.   FOLLOWUP: Follow up with Dr. Juliann Paresallwood and Dr. Cherylann RatelLateef in 1 to 2 weeks after discharge.   DIET: Low sodium.   ACTIVITY: As tolerated.   DISCHARGE MEDICATIONS:  1. Omeprazole 40 mg daily.  2. Aspirin 81 mg daily.  3. Percocet 10/650 mg 1 tablet twice a day as needed for pain.  4. Lopressor 12.5 mg b.i.d.  5. Promethazine 25 mg q.4-6 hours p.r.n.  6. Pravachol 20 mg daily.  7. Imdur 30 mg daily.  8. Nitroglycerin p.r.n.  9. Plavix 75 mg daily.   CONSULTATIONS:  1. Renal consultation with Dr. Mady HaagensenMunsoor Lateef. 2. Cardiology consultation with Dr. Juliann Paresallwood.   PROCEDURE: Catheterization which showed occlusion of patient's bypass graft.   LABORATORY, DIAGNOSTIC, AND RADIOLOGICAL DATA: Chest x-ray shows findings of low-grade congestive heart failure. No discrete infiltrate. Urinalysis showed no evidence of infection. CBC normal. Cardiac enzymes normal. VLDL 21, LDL 89, cholesterol 151, triglyceride 104, HDL 41, hemoglobin A1c 5.16, creatinine 1.96 to 1.68.   HOSPITAL COURSE: Patient is a 74 year old female with past medical history of coronary artery disease status post stenting who underwent coronary artery bypass graft in January 2013, hypertension, hyperlipidemia presented with chest pain and EKG changes. Her cardiac enzymes were normal. She underwent a catheterization by Dr. Juliann Paresallwood which showed occlusion of her graft. He recommended medical management with aspirin, Plavix, beta blocker and statin. Because of her chronic kidney disease and low GFR her ACE inhibitor has been discontinued.  Nephrology consultation with Dr. Cherylann RatelLateef was obtained given patient's chronic kidney disease and administration of contrast with catheterization. Patient received renal protection with Mucomyst. Her renal function was closely monitored. She had good urine output and her creatinine remained at her baseline and she had no evidence of contrast-induced nephropathy. She is being discharged home. She has been advised to avoid nephrotoxic medications. She is not a good candidate for ACE inhibitors and her Lasix has been discontinued and she will continue her statin therapy. Her LDL is 89. She had mild hypokalemia that was supplemented. She is being discharged home in a stable condition.   TIME SPENT: 45 minutes.   ____________________________ Darrick MeigsSangeeta Jihan Mellette, MD sp:cms D: 10/30/2011 10:18:16 ET T: 10/30/2011 10:59:41 ET JOB#: [id number]  cc: Darrick MeigsSangeeta Venesa Semidey, MD, <Dictator> Darrick MeigsSANGEETA Axl Rodino MD ELECTRONICALLY SIGNED 10/31/2011 7:14

## 2014-05-13 NOTE — Discharge Summary (Signed)
PATIENT NAME:  Cynthia [name], Cynthia [name] MR#:  [id number] DATE OF BIRTH:  1940/06/12  DATE OF ADMISSION:  12/10/2011 DATE OF DISCHARGE:  12/11/2011  PRESENTING COMPLAINT: Abdominal pain and nausea.   DISCHARGE DIAGNOSES:  1. Nausea and vomiting, resolved. Suspected gastritis versus viral syndrome.  2. Degenerative joint disease of left hip.  3. Coronary artery disease status post coronary artery bypass graft with history of occluded graft per last catheterization done October 2013.  4. Hyperlipidemia.  5. Hypertension.   CONDITION ON DISCHARGE: Fair.   CODE STATUS: FULL CODE.   DISCHARGE FOLLOWUP: Follow up with Dr. Joen LauraLaura Bliss in 1 to 2 weeks. Follow-up with Dr. Juliann Paresallwood, your cardiologist, on scheduled appointment.  DISCHARGE MEDICATIONS: 1. Zofran 4 mg three times daily as needed.  2. Clopidogrel 75 mg daily.  3. Nitroglycerin 0.4 mg sublingual as needed.  4. Aspirin 81 mg daily.  5. Percocet 10/650 mg one tablet twice a day as needed.  6. Metoprolol 12.5 mg twice a day. 7. Promethazine 25 mg one tablet every 6 to 8 hours as needed.  8. Tramadol 50 mg three times daily. 9. Omeprazole 40 mg daily.  10. Ranexa 500 mg extended-release twice a day. 11. Enalapril 2.5 mg daily.  12. Pravastatin 20 mg at bedtime.  13. Protonix 40 mg p.o. daily.  14. Cyclobenzaprine 5 mg three times daily as needed.  15. Imdur 30 mg extended-release one tablet in the morning.   DIET: Low fat, low cholesterol diet.   RESULTS: Cardiac enzymes x3 negative.   Abdominal x-ray: No acute abnormality.  Chest x-ray: No acute cardiopulmonary abnormality. Positive for CABG and cardiomegaly.   CBC within normal limits. BUN 19, creatinine 1.32, sodium 141, potassium 4.6, chloride 109, and bicarbonate 26. BNP is 1451.  HOSPITAL COURSE: Cynthia [name] is a pleasant 74 year old African American female who came to the emergency room with:  1. Nausea with vomiting. The patient had a couple of episodes of vomiting after being  admitted. Etiology could be either gastritis versus "viral syndrome".  The patient after having a couple of episodes of vomiting started feeling hungry and tolerated a regular diet prior to discharge. Her ultrasound of the abdomen in July of 2013 did not show any gallstones. She does not have any history of kidney stones either. LFTs are normal. Abdominal x-ray was normal. The patient also denied constipation.  2. Left hip degenerative joint disease. The patient has been complaining of some left hip pain while doing routine chores at work. She was recommended to take her Percocet p.r.n. along with Tylenol p.r.n. for degenerative joint disease.  3. Coronary artery disease with occluded grafts from last catheterization in October 2013. Medical management has been recommended by Dr. Juliann Paresallwood. The patient denied any chest pain. EKG did not show any new changes. Home medications were continued. Cardiac enzymes remained negative.  4. Hyperlipidemia. On statins.  5. Left hip pain and left back pain, likely degenerative joint disease. Percocet p.r.n. was prescribed.          Overall the patient felt back to baseline prior to discharge. Her hospital stay otherwise remained stable. The patient remained a FULL CODE.   TIME SPENT: 40 minutes.  ____________________________ Wylie HailSona A. Allena KatzPatel, MD sap:slb D: 12/12/2011 07:21:22 ET T: 12/12/2011 11:28:33 ET JOB#: [id number]  cc: [place] A. Allena KatzPatel, MD, <Dictator> Burley SaverL. Katherine Bliss, MD Dwayne D. Juliann Paresallwood, MD Willow OraSONA A Jorryn Hershberger MD ELECTRONICALLY SIGNED 12/15/2011 20:11

## 2014-05-13 NOTE — H&P (Signed)
PATIENT NAME:  Cynthia [name], Cynthia [name] MR#:  [id number] DATE OF BIRTH:  1940/04/29  DATE OF ADMISSION:  10/28/2011  PRIMARY CARE PHYSICIAN: Dr. Quillian Quince  CARDIOLOGIST: Dr. Juliann Pares REFERRING PHYSICIAN: Dr. Mindi Junker  CHIEF COMPLAINT: Chest pain.   HISTORY OF PRESENT ILLNESS: Patient is a pleasant 74 year old African American female with history of coronary artery disease status post CABG in 01/2011 at [facility], chronic renal failure, history of transient ischemic attack and hypertension who was in her usual state of self until this morning when developed chest pain. The pain initially started around her right shoulder, went to her back area. She does have history of rotator cuff injury and does have chronic shoulder pain, however, then developed substernal chest pain which was severe, greater 10/10 without any radiation. She did feel some shortness of breath and went to her PCP where she was give aspirin and nitroglycerin sublingual x2. EMS was called and patient was given another spray of nitroglycerin and here she was found with a negative troponin but still had chest pain. The EKG did show T wave inversions. The T wave inversions are leads in three and aVF but patient had some T wave inversions in the past, however, does have T wave inversions from V1 to V4, some these are new and up to 3 mm in depth. Patient was started on heparin drip and Dr. Juliann Pares was consulted. He would see the patient and the plan is for catheterization tomorrow. Currently patient states her pain is 5/10 and better than before.   PAST MEDICAL HISTORY:  1. Coronary artery disease, status post stenting in 2006 and CABG for depressed ejection fraction of 25% and multivessel coronary artery disease.  2. Hypertension.  3. Hyperlipidemia.  4. History of transient ischemic attack.  5. Chronic kidney disease.  PAST SURGICAL HISTORY:  1. Cardiac stenting in proximal RCA.  2. Left carotid endarterectomy.  3. CABG.   CURRENT MEDICATIONS:   1. Aspirin 81 mg daily.  2. Lasix 40 mg daily.   3. Metoprolol tartrate 12.5 mg 2 times a day.  4. Omeprazole 40 mg daily.  5. Percocet 10/650, 1 tab 2 times a day as needed for pain.  6. Pravastatin 20 mg daily.  7. Promethazine 25 mg every 6 to 8 hours as needed for nausea.   ALLERGIES: Penicillin.   SOCIAL HISTORY: Ex-smoker, quit earlier this year. Is on disability. No alcohol or drug use. Is married. Lives in [place].   FAMILY HISTORY: Raised by grandmother who had hypertension otherwise does not know.    REVIEW OF SYSTEMS: CONSTITUTIONAL: No fever, fatigue, weakness. No weight changes. EYES: No blurry vision or double vision. ENT: No tinnitus, hearing loss or snoring. RESPIRATORY: Denies cough, wheezing, hemoptysis, shortness of breath currently or chronic obstructive pulmonary disease. CARDIOVASCULAR: Denies any orthopnea or swelling in the legs. Does have chest pain substernal without radiation. No palpitations or syncope. GASTROINTESTINAL: No nausea, vomiting, diarrhea, abdominal pain, bloody stools or dark stools. GENITOURINARY: Denies dysuria, hematuria or frequency. HEME/LYMPH: No anemia or easy bruising. SKIN: No new rashes. MUSCULOSKELETAL: Chronic right shoulder pain. NEUROLOGIC: No numbness. History of transient ischemic attack. PSYCHIATRIC: No anxiety or insomnia.   PHYSICAL EXAMINATION:  VITAL SIGNS: Temperature on arrival 98.2, pulse 69, respiratory rate 16, blood pressure on arrival 119/77, last one 138/87, oxygen sats 100% on oxygen.   GENERAL: Patient is an obese African American female lying in bed, no obvious distress.   HEENT: Normocephalic, atraumatic. Pupils are equal and reactive. Anicteric sclerae. Moist mucous  membranes.   NECK: Supple. There is left side of the neck healed scar from previous carotid endarterectomy. No JVD.   CARDIOVASCULAR: S1, S2 regular rate and rhythm. No murmurs, rubs, or gallops. Decreased heart sounds.   LUNGS: Clear to auscultation  without wheezing or rhonchi. Good air entry. Good effort.   ABDOMEN: Soft, nontender, nondistended, obese. No organomegaly appreciated.   EXTREMITIES: No significant lower extremity edema.   NEUROLOGICAL: Cranial nerves II through XII grossly intact. Strength is 5/5 in all extremities.   SKIN: No obvious rashes.   PSYCH: Awake, alert, oriented x3, pleasant and cooperative.   LABORATORY, DIAGNOSTIC, AND RADIOLOGICAL DATA: Creatinine 1.96, was 1.39 on 09/16/2011. BUN 29, glucose 113, potassium 3.6, sodium 139, anion gap 12, GFR 29. LFTs within normal limits. Troponin negative. CK-MB 1.1. CK total elevated at 350, WBC 3.7, hemoglobin 14.4, platelets 232, INR 1. EKG normal sinus rhythm, incomplete right bundle branch block, anteroseptal T wave inversions from V1 to V4 up to about 3 mm. There is also T wave inversions in 3 and aVF. No acute ST elevations. X-ray of the chest pending.   ASSESSMENT AND PLAN: We have a pleasant 74 year old African American female with history of coronary artery disease status post stenting in 2006 and CABG earlier this year with ejection fraction of 35% and chronic systolic congestive heart failure, hypertension, hyperlipidemia status post left carotid endarterectomy who presents with acute onset chest pain. Patient does have some EKG changes but has negative troponin. Patient likely has unstable angina and at this point has been seen by Dr. Juliann Paresallwood and the plan is for cardiac catheterization. Would continue the heparin drip which has been started. The pain is better with nitroglycerin. Would continue sublingual and add nitro paste. Would give aspirin, start Plavix, resume statin and the beta blocker. Will cycle the troponins and check an echocardiogram. Would also check a lipid profile and hemoglobin A1c. Patient also does have acute on chronic renal failure with creatinine higher than baseline and she is going for a catheterization. Will obtain a nephrology consult. Patient  does see Dr. Thedore MinsSingh as an outpatient. We would hold the Lasix. In regards to depressed ejection fraction, patient likely has chronic systolic congestive heart failure and does not appear to be in a volume overload state currently. We would hold the Lasix given the renal failure. She has no significant lower extremity edema. We would obtain an echocardiogram. We would resume the beta blocker for high blood pressure and the statin for her hyperlipidemia.   TOTAL TIME SPENT: 45 minutes.  ____________________________ Krystal EatonShayiq Rupa Lagan, MD sa:cms D: 10/28/2011 12:49:33 ET T: 10/28/2011 13:14:31 ET JOB#: [id number]  cc: Krystal EatonShayiq Ticia Virgo, MD, <Dictator> Burley SaverL. Katherine Bliss, MD Dwayne D. Juliann Paresallwood, MD Mosetta PigeonHarmeet Singh, MD  Marcelle SmilingSHAYIQ [facility]ADZIA MD ELECTRONICALLY SIGNED 11/08/2011 17:57

## 2014-05-13 NOTE — H&P (Signed)
PATIENT NAME:  [name], [name] MR#:  [id number] DATE OF BIRTH:  02-21-1940  DATE OF ADMISSION:  12/10/2011  PRESENTING COMPLAINT: Nausea and abdominal pain.   HISTORY OF PRESENT ILLNESS: Cynthia [name] is a pleasant 74 year old Caucasian female with history of coronary artery disease status post CABG and has history of occluded grafts, medical management recommended, history of hypertension, hyperlipidemia comes to the Emergency Room accompanied by her husband with complains of nausea and generalized abdominal discomfort. Patient says she was doing well. She was just admitted, discharged October first week of 2013. She started having some chest discomfort yesterday evening around 4:00. No aggravating precipitating symptoms. Lasted about 10 minutes. She did not have further chest pain, however, started having some nausea and some abdominal discomfort which she says is more generalized. Denies constipation. Denies vomiting or diarrhea. She came to the Emergency Room, received some aspirin. Her EKG does not show any acute changes. Cardiac enzymes are negative. Patient is being admitted for further evaluation of her nausea and generalized abdominal pain.   Patient had abdominal CT scan in August 2013, negative for any acute abnormality. She also had ultrasound of the abdomen in July 2013 which was negative for gallstones and her serial LFTs during different times have been normal. She does complain of acid reflux. Patient is being admitted for further evaluation and management.   PAST MEDICAL HISTORY:  1. History of coronary artery disease, status post CABG and last heart catheterization October 2013 with occluded grafts, medical management recommended per Dr. Juliann Paresallwood.  2. Hypertension.  3. Hyperlipidemia.  4. Chronic kidney disease, stage III, creatinine around 1.3 to 1.4.  5. Ischemic cardiomyopathy, ejection fraction around 25%.  6. Acid reflux.   MEDICATIONS: According to 10/06 discharge summary are:   1. Omeprazole 40 mg daily.  2. Aspirin 81 mg daily.  3. Percocet 10/650, 1 tablet b.i.d. as needed.  4. Lopressor 12.5 b.i.d.  5. Promethazine 25 mg q.4-6 p.r.n.   6. Pravachol 20 mg daily.  7. Imdur 30 mg daily.  8. Nitroglycerin p.r.n.  9. Plavix 75 mg daily.   SOCIAL HISTORY: Married, lives with her husband. Nonsmoker. [place]. She is on disability. She quit smoking earlier this year.   FAMILY HISTORY: Grandmother has hypertension.   REVIEW OF SYSTEMS: CONSTITUTIONAL: No fever. Positive for fatigue, weakness. EYES: No blurred or double vision. No glaucoma. ENT: No tinnitus, ear pain, hearing loss. RESPIRATORY: No cough, wheeze, hemoptysis. CARDIOVASCULAR: No chest pain, orthopnea, edema. Positive for hypertension. GASTROINTESTINAL: Positive for nausea and abdominal pain. No diarrhea, rectal bleed or melena. GENITOURINARY: No dysuria, hematuria, or frequency. ENDOCRINE: No polyuria or nocturia. HEMATOLOGY: No anemia or easy bruising. SKIN: No acne, rash. MUSCULOSKELETAL: Positive for arthritis. NEUROLOGIC: No cerebrovascular accident, transient ischemic attack. PSYCH: No anxiety or depression. All other systems reviewed and negative.   PHYSICAL EXAMINATION:  GENERAL: Patient is awake, alert, oriented x3, not in acute distress.   VITAL SIGNS: Afebrile, pulse 68 regular, blood pressure 137/89, sats 95% on 2 liters.   HEENT: Atraumatic normocephalic. Pupils are equal, round, and reactive to light and accommodation. Extraocular movements intact. Oral mucosa is moist.   NECK: Supple. No JVD. No carotid bruit.   RESPIRATORY: Clear to auscultation bilaterally. No rales, rhonchi, respiratory distress, or labored breathing.   CARDIOVASCULAR: Both the heart sounds are normal. Rate, rhythm regular. PMI not lateralized. Chest nontender.    EXTREMITIES: Good pedal pulses, good femoral pulses. No lower extremity edema.   ABDOMEN: Soft. No organomegaly.  There is some diffuse tenderness  present. No guarding, rigidity. Murphy sign is negative. Good bowel sounds. Did not feel any organomegaly.   SKIN: Warm and dry.   PSYCHIATRIC: Patient is awake, alert, oriented x3.   LABORATORY, DIAGNOSTIC AND RADIOLOGICAL DATA: Chest x-ray cardiomegaly. No acute cardiopulmonary abnormality other than cardiomegaly. White count 3.2, hemoglobin and hematocrit 12.6 and 37.4, platelet count 176. Glucose 94, BUN 19, creatinine 1.32, sodium 146, potassium 4.6, chloride 109, troponin 0.02. B-type natriuretic peptide 1451. EKG shows sinus rhythm with nonspecific ST-T changes in inferior leads. No acute ST elevation or any new changes.   ASSESSMENT: 74 year old [name] with diffuse coronary artery disease, hypertension, hyperlipidemia presents to the Emergency Room with:  1. Nausea and generalized abdominal pain. Patient denies vomiting, denies constipation. Will admit for overnight observation on telemetry floor. Patient recently had ultrasound abdomen in July 2013. There were no gallstones. Her LFTs have been normal. She had a CT of the abdomen to rule out stone in August 2013 that has been negative. Etiology possibly gastroesophageal reflux disease given symptoms of bloating, belching. Will start her on IV Protonix. Get abdominal x-ray. GI consultation if symptoms do not improve. Will start patient on clear liquid.  2. Coronary artery disease with occluded grafts from last catheterization October 2013 per Dr. Juliann Pares. She is status post coronary artery bypass graft. Cardiac enzymes negative. No acute changes. Will continue home medications. Will cycle cardiac enzymes x2.  3. Hypertension. Continue metoprolol.  4. History of hyperlipidemia. Patient is on Pravachol which will continue.  5. Morbid obesity.  6. Chronic kidney disease, stage III. Creatinine appears stable. Will continue IV fluids.  7. Deep vein thrombosis prophylaxis with sub-Q heparin.  8. Further work-up according to patient's clinical  course. Hospital admission plan was discussed with patient, patient's husband who is present in the Emergency Room.       TIME SPENT: 50 minutes.  ____________________________ Wylie Hail Allena Katz, MD sap:cms D: 12/10/2011 09:38:39 ET T: 12/10/2011 10:23:16 ET JOB#: [id number]  cc: Kelani Robart A. Allena Katz, MD, <Dictator> [facility] [place], Dr. Joen Laura  Willow Ora MD ELECTRONICALLY SIGNED 12/12/2011 10:25

## 2014-05-13 NOTE — Consult Note (Signed)
Brief Consult Note: Diagnosis: [place] cad.   Patient was seen by consultant.   Consult note dictated.   Recommend further assessment or treatment.   Orders entered.   Discussed with Attending MD.   Comments: IMP [place] CAD DM HTN CRI Abn Ekg Hx Smoking Hx CABG . PLAN Tele ASA B-blocker ACE NTG Heparin Cath for [place].  Electronic Signatures: Dorothyann Pengallwood, Dwayne D (MD)  (Signed 04-Oct-13 12:02)  Authored: Brief Consult Note   Last Updated: 04-Oct-13 12:02 by Dorothyann Pengallwood, Dwayne D (MD)

## 2014-05-16 NOTE — H&P (Signed)
PATIENT NAME:  Cynthia [name], Cynthia [name] MR#:  [id number] DATE OF BIRTH:  1940-11-08  DATE OF ADMISSION:  11/06/2012  PRIMARY CARE PHYSICIAN: Dr. Clayborn BignessKatherine Bliss.  CARDIOLOGIST: Dr. Juliann Paresallwood.   CHIEF COMPLAINT: Chest pain.   HISTORY OF PRESENT ILLNESS: This is a 74 year old female who presents to the hospital with chest pain that began early this morning. The patient says that she was at [employer] study and she suddenly developed substernal chest pain, which was nonradiating and not associated with any shortness of breath, nausea, vomiting, palpitations or diaphoresis. The patient was a bit concerned and therefore came to the ER for further evaluation. The patient does have a history of coronary artery disease, status post bypass. She had a cardiac catheterization done in October 2013, which showed significant native coronary artery disease with also a significant occlusion of her grafts. She was here about a month ago with chest pain, was ruled out with cardiac markers and discharged home to have followup with Dr. Juliann Paresallwood, but she missed her followup. She now comes in with recurrent chest pain. Hospitalist services were contacted for further treatment and evaluation.   REVIEW OF SYSTEMS:    CONSTITUTIONAL: No documented fever. No weight gain, no weight loss.  EYES: No blurred or double vision.  EARS, NOSE, THROAT: No tinnitus. No postnasal drip. No redness of the oropharynx.  RESPIRATORY: No cough, no wheeze, no hemoptysis, no dyspnea.  CARDIOVASCULAR: Positive chest pain. No orthopnea, no palpitations, no syncope.  GASTROINTESTINAL: No nausea, no vomiting, no diarrhea, no abdominal pain. No melena or hematochezia.  GENITOURINARY: No dysuria, no hematuria.  ENDOCRINE: No polyuria, nocturia, heat or cold intolerance.  HEMATOLOGIC: No anemia. No bruising. No bleeding.  INTEGUMENTARY: No rashes. No lesions.  MUSCULOSKELETAL: No arthritis. No swelling. No gout.  NEUROLOGIC: No numbness. No tingling. No ataxia. No  seizure-type activity.  PSYCHIATRIC: Positive anxiety. No insomnia. No ADD.   PAST MEDICAL HISTORY: Consistent with history of coronary artery disease, status post bypass, hypertension, hyperlipidemia GERD, anxiety.   SOCIAL HISTORY: Used to be a smoker, quit about a year or so ago. Does have a 30 to 40 pack-year smoking history. No alcohol abuse. No illicit drug abuse. Lives at home with her husband.   FAMILY HISTORY: The patient cannot recall any of her family history, as he was raised by her grandmother.   ALLERGIES: PENICILLIN, WHICH CAUSES ANAPHYLAXIS.   PHYSICAL EXAMINATION: VITAL SIGNS: Presently, temperature is 97.7, pulse 67, respirations 18, blood pressure 109/68, sats 96% on room air.  GENERAL: The patient is a pleasant-appearing female in no apparent distress.  HEAD, EYES, EARS, NOSE, THROAT: The patient is atraumatic, normocephalic. Extraocular muscles are intact. Pupils equal and reactive to light. Sclerae anicteric. No conjunctival injection. No pharyngeal erythema.  NECK: Supple. There is no jugular venous distention. No bruits, no lymphadenopathy, no thyromegaly.  HEART: Regular rate and rhythm. No murmurs, no rubs, no clicks.  LUNGS: Clear to auscultation bilaterally. No rales, no rhonchi, no wheezes.  ABDOMEN: Soft, flat, nontender, nondistended. Has good bowel sounds. No hepatosplenomegaly appreciated.  EXTREMITIES: No evidence of any cyanosis, clubbing or peripheral edema. Has +2 pedal and radial pulses bilaterally.  NEUROLOGIC: The patient is alert, awake, oriented x 3 with no focal motor or sensory deficits appreciated bilaterally.  SKIN: Moist and warm with no rashes appreciated.  LYMPHATIC: There is no cervical or axillary lymphadenopathy.   LABORATORY, DIAGNOSTIC AND RADIOLOGICAL DATA: Serum glucose of 111, BUN 18, creatinine 1.3, sodium 138, potassium 3.9, chloride 106, bicarbonate  27. Troponin less than 0.02. White cell count 4, hemoglobin 12.1, hematocrit 35.4,  platelet count 205. INR is 1.   The patient did have a chest x-ray done, which showed no evidence of any acute cardiopulmonary disease.   The patient also had an EKG done, which shows normal sinus rhythm with normal axis and a right bundle branch block, but no other acute ST or T wave changes.   ASSESSMENT AND PLAN: This is a 74 year old female with a past medical history of coronary artery disease, status post bypass, hypertension, hyperlipidemia, gastroesophageal reflux disease, anxiety, who presents to the hospital with chest pain.  1.  Chest pain: The patient does have significant risk factors given her history of coronary artery disease and also tobacco abuse. She was here just about a month ago with chest pain and was ruled out with cardiac markers, but did not have any further work-up. She has had a cardiac catheterization done in October 2013, which showed all her grafts were occluded, so she is at high risk for having cardiac chest pain. For now, I will continue her aspirin, Plavix, beta blocker and statin and nitroglycerin. Will get a cardiology consult. I discussed the case with Dr. Juliann Pares, who will see the patient. He will consider either doing a repeat stress dose versus a cardiac catheterization. Would also consider adding the patient on Ranexa, if medical management is the only option. The patient's symptoms are atypical and could also be anxiety-related given the fact that her son has been missing now for over 3 weeks.  2.  Hypertension: The patient is presently hemodynamically stable. Continue Imdur. Continue metoprolol.  3.  Hyperlipidemia: Continue Pravachol.  4.  Gastroesophageal reflux disease: Continue Protonix.   CODE STATUS: The patient is a full code.   TIME SPENT ON ADMISSION: 50 minutes.   ____________________________ Rolly Pancake. Cherlynn Kaiser, MD vjs:jm D: 11/06/2012 15:01:55 ET T: 11/06/2012 15:46:23 ET JOB#: [id number]  cc: Rolly Pancake. Cherlynn Kaiser, MD, <Dictator> Houston Siren  MD ELECTRONICALLY SIGNED 11/21/2012 14:31

## 2014-05-16 NOTE — Discharge Summary (Signed)
PATIENT NAME:  Cynthia [name], Cynthia [name] MR#:  [id number] DATE OF BIRTH:  07/02/1940  DATE OF ADMISSION:  11/06/2012 DATE OF DISCHARGE:  11/09/2012  REASON FOR ADMISSION: Chest pain.  DISCHARGE DIAGNOSES: 1.  Chest pain.  2.  Unstable angina. 3.  Coronary artery disease. 4.  Status post coronary artery bypass grafting. 5.  Placement of PCI stent in the proximal left anterior descending, drug eluting stent, 75% to 0. 6.  Hypertension.  7.  Acute congestive heart failure, diastolic, after procedure.  8.  Hyperlipidemia. 9.  Gastroesophageal reflux disease. 10.  Acute kidney injury.  MEDICATIONS ON DISCHARGE: Aspirin 81 mg daily, promethazine 25 mg as needed for nausea, Plavix 75 mg daily, pravastatin 20 mg at bedtime, metoprolol 25 mg twice daily, isosorbide mononitrate 30 mg once a day, furosemide 40 mg once a day, Protonix 40 mg once daily, Norco 5/325 as needed, Nitrostat 0.4 mg as needed for chest pain, Enalapril 2.5 mg once a day.   RESULTS: Glucose 95. Creatinine 1.28 at discharge; 1.58 was highest.  HOSPITAL COURSE:  A very nice 74 year old female with history of hypertension, coronary artery disease status post CABG, presented to the ER with a history of going to Bible study and suddenly developing substernal chest pain with radiation, no shortness of breath.  The patient went to the ER, was evaluated. She has multiple risk factors including history of coronary artery disease, tobacco abuse, hypertension. Cardiac biomarkers were negative.  Cardiology was consulted, Dr. Juliann Paresallwood, and it based on her history the patient was taken for a cardiac catheterization.  On October 15, the patient underwent a cardiac catheterization showing 90% proximal LAD, 75T mid circumflex, 100% proximal RCA. It was recommended to have a PCI to the LAD, but because of theeather the stent was not able to arrive so the patient was scheduled for another cath in the morning, and then the patient had a stent, PCI to the proximal  LAD 75% to zero.  The patient did not have any further chest pain. She had acute exacerbation of her systolic CHF after the cardiac catheterization due to fluid overload, which resolved with Lasix. Patient did well during this hospitalization and was discharged in good condition.   TIME SPENT:  45 minutes with this discharge.      ____________________________ Felipa Furnaceoberto Sanchez Gutierrez, MD rsg:cg D: 11/14/2012 00:32:00 ET T: 11/14/2012 01:10:16 ET JOB#: [id number]  cc: Felipa Furnaceoberto Sanchez Gutierrez, MD, <Dictator> Keiffer Piper Juanda ChanceSANCHEZ GUTIERRE MD ELECTRONICALLY SIGNED 11/17/2012 23:11

## 2014-05-16 NOTE — H&P (Signed)
PATIENT NAME:  Cynthia [name], Cynthia [name] MR#:  [id number] DATE OF BIRTH:  [birth year]  REFERRING PHYSICIAN: Sharman CheekPhillip Stafford, MD.  PRIMARY CARE PHYSICIAN: Burley SaverL. Katherine Bliss, MD. CARDIOLOGIST: Dorothyann Pengwayne Callwood, MD. Chief Complaint: chest pain  HISTORY OF PRESENT ILLNESS: Ms. Cynthia [name] is a 74 year old African American female with past medical history of coronary artery disease status post CABG with 3 vessels,  hypertension, hyperlipidemia, who is presenting with a chest pain  starting under her right breast. She had an episode of chest pain starting under her right breast and radiating to her sternum which she described only as pain. She rated it as 100/10 with no worsening or relieving factors. This episode started while she was watching TV, but she does state that she had increased stress secondary to family concerns for the last few days. Her symptoms lasted for approximately 90 minutes prior to arrival to the Emergency Department and decreased currently to 5/10 in the Emergency Department after given nitro x2.   She denied any associated shortness of breath, dyspnea on exertion, palpitations, diaphoresis. In the Emergency Department, an initial EKG and cardiac enzymes were within normal limits. Currently she states that she does have chest pain that is similar in quality, once again, 5/10 in intensity. This is unlike any of her previous MIs in nature.   REVIEW OF SYSTEMS: CONSTITUTIONAL: Denies any fevers, fatigue, weakness.  EYES: Denies any eye pain or blurred vision.  ENT: Denies any ear pain, discharge, dysphagia.  RESPIRATORY: Denies cough, wheeze, shortness of breath.  CARDIOVASCULAR: Chest pain as above. Denies any orthopnea, edema, arrhythmias, dyspnea on exertion, palpitations, or syncope.  GASTROINTESTINAL: Denies nausea, vomiting, diarrhea, abdominal pain.  GENITOURINARY: Denies dysuria, hematuria.  ENDOCRINE: Denies nocturia or thyroid problems.  HEMOLYMPHATIC: Denies easy bruising or bleeding.  SKIN:  Denies any rash or lesion.  MUSCULOSKELETAL: Denies any pain in neck, back, shoulders, or any arthritis.  NEUROLOGIC: Denies any paralysis or paresthesias.  PSYCHIATRIC: Denies any anxiety or depressive symptoms.   Otherwise, full review of systems performed by me is negative.   PAST MEDICAL HISTORY: Coronary artery disease status post CABG of 3 vessels with LIMA to LAD, SVG to the obtuse marginal, SVG to RCA. Most recent cardiac catheterization performed in October 2013, which revealed multivessel disease with an EF of 40%. LAD 75% proximal lesion with left circumflex 75% mid vessel lesion, RCA 100% occlusion, LIMA to  LAD 100%, SVG to obtuse marginal 100%, SVG to RCA 100% occlusion. At that time, PCI stenting was determined to be non-beneficial and was to continue on medical management.   Also, past medical history of hypertension, hyperlipidemia.  FAMILY HISTORY: Denies any cardiovascular disease, diabetes or hypertension.   SOCIAL HISTORY: She is an ex-tobacco user, last smoked approximately 1 year ago. Denies any alcohol usage. She is married. Her husband is at bedside currently.   ALLERGIES: PENICILLIN.   HOME MEDICATIONS: Include aspirin 81 mg p.o. daily, Plavix 75 mg daily, Lasix 40 mg p.o. daily, Imdur 30 mg p.o. daily, metoprolol 25 mg p.o. b.i.d., pravastatin 20 mg p.o. at bedtime, promethazine 25 mg p.o. q.6-8 hours p.r.n. for nausea, Protonix 40 mg p.o. daily.   PHYSICAL EXAMINATION:  VITAL SIGNS: Temperature 98.1 degrees Fahrenheit, pulse 68, respirations 19, blood pressure 145/79, saturating 93% on room air. Weight 118.4 kg, BMI 36.4.  GENERAL: Well-nourished, well-developed female in no acute distress.  HEAD: Normocephalic, atraumatic.  EYES: Pupils equal, react to light as well as accommodation. Extraocular muscles intact. No scleral icterus.  MOUTH: Moist  mucous membranes. Dentition intact. No abscess noted.  ENT: Throat is clear without exudate. No external lesions noted.   NECK: Supple. No thyromegaly. No nodules appreciated. No JVD.  CARDIOVASCULAR: S1, S2, regular rate and rhythm. No murmurs, rubs, or gallops. No peripheral edema. Pedal pulses 2+ bilaterally.  PULMONARY: Clear to auscultation bilaterally without wheezes, rubs, or rhonchi. No use of accessory muscles. Good respiratory effort bilaterally.  CHEST: Tender to palpation which is reproducible tenderness over the sternum as well as below the right breast. Dissimilar in nature to her previous symptoms.  GASTROINTESTINAL: Soft, nontender, nondistended with no masses. No hepatosplenomegaly. Positive bowel sounds.  MUSCULOSKELETAL: No swelling, clubbing, or edema. Range of motion is full in all extremities.  NEUROLOGIC: Cranial nerves II through XII intact. No gross neurological deficits. Sensation intact. Reflexes intact.  SKIN: No ulcerations, lesions, or rashes. No cyanosis noted. Skin is warm and dry. Turgor is intact.  PSYCHIATRIC: Mood and affect within normal limits. She is alert and oriented x3. Insight and judgment are intact.   LABORATORY DATA: Sodium 40, potassium 3.8, chloride 109, bicarbonate 26, BUN 18, creatinine 1.63 with the last known creatinine of 1.4. Glucose of 99. BNP 2468. CK-MB 0.8, troponin I less than 0.2. WBC of 3.8, hemoglobin 11.9, platelets of 212.   EKG: Normal sinus rhythm. There is a right bundle branch block and evidence of an inferior infarct secondary to Q waves. This is unchanged from her previous EKGs.   ASSESSMENT AND PLAN: A 74 year old [place] American female with past medical history significant for coronary artery disease requiring coronary artery bypass graft. She has known multivessel disease on cardiac catheterization performed October 2013. Presenting with atypical chest pain.  1.  Atypical chest pain. Reproducible on palpation. Likely musculoskeletal in nature, but given significant history, we will admit to the telemetry observation, trend cardiac enzymes q.8  hours  as well as provide medication for pain relief.  2.  Acute kidney injury on chronic kidney disease. IV fluid hydration with normal saline.  3.  Coronary artery disease status post coronary artery bypass graft. Continue with aspirin, statin, Plavix, Imdur, Lopressor. She has already received nitroglycerin and aspirin already.  4.  Hyperlipidemia. Continue with pravastatin.  5.  Hypertension. Continue Imdur and Lopressor.  6.  Deep vein thrombosis, prophylaxis, heparin subcutaneous.   TIME SPENT: 45 minutes.   CODE STATUS: FULL CODE.   ____________________________ Cletis Athens. Hower, MD dkh:np D: 10/18/2012 21:15:45 ET T: 10/18/2012 22:11:27 ET JOB#: [id number]  cc: Cletis Athens. Hower, MD, <Dictator> DAVID Synetta Shadow MD ELECTRONICALLY SIGNED 10/20/2012 21:53

## 2014-05-16 NOTE — Consult Note (Signed)
PATIENT NAME:  [name], [name] MR#:  [id number] DATE OF BIRTH:  07/11/1940  DATE OF CONSULTATION:  06/13/2012  REFERRING PHYSICIAN:  Dr. Juliene [name] CONSULTING PHYSICIAN:  Cynthia BlinksBruce J. Andriy Sherk, MD  REASON FOR CONSULTATION: Chest discomfort with known coronary artery disease status post coronary artery bypass graft and with cardiac catheterization last year showing some occluded  of the PDA and obtuse marginal as well as left anterior descending artery. The patient was appropriately placed on appropriate medication management and has still had some episodes of chest discomfort. Currently, the patient has no evidence of infarction by troponin which are  normal. Kidney disease with a creatinine of 1.4.  The patient has been having an EKG showing normal sinus rhythm with right bundle branch block. She has been on appropriate medications for hypertension and hyperlipidemia as well as placement on Plavix previously for an occluded grafts and coronary arteries with her cardiac catheterization many months prior and no intervention was performed due to concerns of diffuse coronary artery disease. The patient now has had a substernal episodes of chest discomfort radiating into the back occurring for about 20 minutes and improved spontaneously. She has had shortness of breath with this as well as consistent with [place] class IV angina. The patient now has improvements with no evidence of further symptoms.   REVIEW OF SYSTEMS: The remainder review of systems negative for vision change, ringing in the ears, hearing loss, cough, congestion, heartburn, nausea, vomiting, diarrhea, bloody stools, stomach pain, extremity pain, leg weakness, cramping of the buttocks, known blood clots, headaches, blackouts, dizzy spells, nosebleeds, congestion, trouble swallowing, frequent urination, urination at night, muscle weakness, numbness, anxiety, depression, skin lesions or skin rashes.   PAST MEDICAL HISTORY: 1.  Known coronary artery  disease, status post coronary artery bypass graft with multiple three-vessel disease and occluded grafts.  2.  Hypertension.  3.  Hyperlipidemia.   FAMILY HISTORY: No family members with early onset of cardiovascular disease or hypertension.   SOCIAL HISTORY: Currently denies alcohol or tobacco use.   ALLERGIES: As listed.   MEDICATIONS: As listed.   PHYSICAL EXAMINATION: VITAL SIGNS: Blood pressure 126/68 bilaterally, heart rate 72 upright, reclining and regular.  GENERAL: She is a well appearing female in no acute distress.  HEENT: No icterus, thyromegaly, ulcers, hemorrhage or xanthelasma.  CARDIOVASCULAR: Regular rate and rhythm with normal S1 and S2 without murmur, gallop, or rub. Point of maximal impulse is normal size and placement. Carotid upstroke normal without bruit. Jugular venous pressure is normal.  LUNGS: Lungs have few basilar crackles with normal respirations.  ABDOMEN: Soft, nontender, without hepatosplenomegaly or masses. Abdominal aorta is normal size without bruit.  EXTREMITIES: Shows 2+ radial, femoral, dorsal pedal pulses with no lower extremity edema, cyanosis, clubbing or ulcers.  NEUROLOGIC: She is oriented to time, place and person with normal mood and affect.   ASSESSMENT: This is a 74 year old female with coronary artery disease, hypertension, hyperlipidemia, occluded graft with acute onset of angina [place] class IV angina without current evidence of myocardial infarction or further EKG changes.   RECOMMENDATIONS: 1.  Continue serial electrocardiography and enzymes to assess for the possibility of myocardial infarction.  2.  Consider echocardiogram for left ventricular systolic dysfunction, valvular heart disease contributing to significant progression of chest discomfort.  3.  Addition of nitrates topically and would use isosorbide orally as well for chest discomfort and would consider Ranexa if necessary for further concerns of angina as an outpatient.  4.   Treadmill Myoview to assess exercise  tolerance, ischemic burden and episodes of chest pain at what level of stress to reassess medication management.  5.  Further evaluation including the possibility of cardiac catheterization only if stress test is significantly abnormal based on previous assessments with reviewed cardiac catheterization from 11/2011 suggesting no large coronary arteries amenable to percutaneous coronary intervention and stent placement.  6.  Continue medication management with hyperlipidemia with statin for high intensity cholesterol therapy.  7.  Further treatment options after above.    ____________________________ Cynthia Blinks, MD bjk:cc D: 06/13/2012 17:17:00 ET T: 06/13/2012 17:37:30 ET JOB#: [id number]  cc: Cynthia Blinks, MD, <Dictator> Cynthia Blinks MD ELECTRONICALLY SIGNED 06/26/2012 7:49

## 2014-05-16 NOTE — Consult Note (Signed)
Brief Consult Note: Diagnosis: [place] CAD CRI.   Patient was seen by consultant.   Consult note dictated.   Recommend further assessment or treatment.   Orders entered.   Discussed with Attending MD.   Comments: IMP [place] CRI DM HTN Hyperlipidemia Hx CABG DJD Anxiety . PLAN ROMI Tele Bp control Pain control Monitor DM Cardiac cath in am.  Electronic Signatures: Alwyn Peaallwood, Benjimin Hadden D (MD)  (Signed 15-Oct-14 07:04)  Authored: Brief Consult Note   Last Updated: 15-Oct-14 07:04 by Alwyn Peaallwood, Annis Lagoy D (MD)

## 2014-05-16 NOTE — Discharge Summary (Signed)
PATIENT NAME:  Cynthia [name], Cynthia [name] MR#:  [id number] DATE OF BIRTH:  11/21/1940  DATE OF ADMISSION:  06/13/2012 DATE OF DISCHARGE:  06/14/2012  ADMITTING DIAGNOSIS:  Unstable angina.  DISCHARGE DIAGNOSES: 1. Chest pain of unclear etiology. 2.  Hyperlipidemia.  3.  Obesity. 4.  A history of coronary artery disease, status post coronary artery bypass grafting. 5.  Chronic kidney disease.   DISCHARGE CONDITION:  Stable.  DISCHARGE MEDICATIONS:  The patient is to continue aspirin 81 mg p.o. daily, Percocet 10/250 1 tablet twice daily as needed, promethazine 25 mg every 6 to 8 hours as needed, clopidogrel 75 mg daily, pravastatin 20 mg p.o. at bedtime, metoprolol tartrate 25 mg p.o .twice  daily, isosorbide mononitrate 30 mg p.o. daily; furosemide 40 mg p.o. once daily as needed, Protonix 40 mg p.o. daily. Imdur is a new medication.  HOME OXYGEN:  None.   DIET:  Low fat, low cholesterol.(Dictation Anomaly)Regular consistency.  FOLLOW UP:  Followup appointment with Dr. Quillian QuinceBliss in 2 days after discharge.   CONSULTANTS:  Dr. Arnoldo HookerBruce Kowalski, Care Management.   RADIOLOGIC STUDIES:   Chest x-ray, portable, single view, on  06/13/2012 showed no evidence of acute cardiopulmonary abnormality. A Doppler ultrasound of left lower extremity on 06/13/2012 revealed no evidence of DVT in left lower extremity.   The patient is a 74 year old female with past medical history significant for a history of coronary artery disease who presented to the hospital with complaints of chest pains. Please refer to Dr. Camillia HerterMody's admission on 06/13/2012. On arrival to the hospital, the patient's temperature was 98.2, pulse was 65, respiratory rate was 18, blood pressure 134/70, saturation was 98% on room air. Physical exam was unremarkable. The patient's EKG showed sinus rhythm with no acute ST-T changes. Some PACs were noted. The patient's lab data done on the day of admission 06/13/2012 revealed a mild elevation of BUN and creatinine of  21 and 1.46. The patient's glucose 102, otherwise BMP was unremarkable. Estimated GFR for non-African American would be 36, African American would be 42. The patient's liver enzymes were normal. Cardiac enzymes x 3 were within normal limits. CBC:  White cell count was 3.8, hemoglobin was 12.1, platelet count 190. D-dimer was normal at 0.25. The patient's chest x-ray was unremarkable. The patient was admitted to the hospital for further evaluation. Her cardiac enzymes were cycled. She was consulted by cardiologist. Myoview stress test was ordered; however, Dr. Arnoldo HookerBruce Kowalski felt that patient would not be able to perform stress test. He recommended to continue medical regimen for hypertension, hyperlipidemia, continue aspirin as well as Plavix for (Dictation Anomaly)MI risk reduction, also start isosorbide.  This was done and patient was discharged with the  follow up with Dr. Gwen PoundsKowalski next week for adjustments of medications. On the day of discharge the patient had oxygen without complaining of any significant discomfort. She is being discharged home with the above-mentioned medications and followup.  HER VITAL SIGNS ON THE DAY OF DISCHARGE:  Temperature is 98.4, pulse 50s to 90s, respiratory rate was 18, blood pressure 112/75, saturation was 94% on room air at rest and 90% on room air upon exertion.   TIME SPENT:  40 minutes.   ____________________________ Katharina Caperima Yoshi Mancillas, MD rv:jm D: 06/15/2012 14:10:24 ET T: 06/15/2012 14:58:52 ET JOB#: [id number]  cc: Katharina Caperima Hollee Fate, MD, <Dictator> Burley SaverL. Katherine Bliss, MD Willadean Guyton MD ELECTRONICALLY SIGNED 07/01/2012 18:26

## 2014-05-16 NOTE — Discharge Summary (Signed)
PATIENT NAME:  Cynthia [name], Cynthia [name] MR#:  [id number] DATE OF BIRTH:  1941/01/21  DATE OF ADMISSION:  10/18/2012 DATE OF DISCHARGE:  10/19/2012  PRESENTING COMPLAINT: Chest pain.   DISCHARGE DIAGNOSES:  1.  Chest pain, resolved. Appears to be stress related.  2.  Hypertension.  3.  Hyperlipidemia.  4.  History of coronary artery disease, status post coronary artery bypass graft.   CODE STATUS: Full code.   MEDICATIONS:  1.  Aspirin 81 mg daily.  2.  Promethazine 25 mg every 6 to 8 hours as needed.  3.  Plavix 75 mg daily.  4.  Pravastatin 20 mg at bedtime.  5.  Metoprolol 25 mg b.i.d.  6.  Imdur 30 mg extended release p.o. daily.  7.  Lasix 40 mg daily.  8.  Protonix 40 mg daily.  9.  Acetaminophen/hydrocodone 5/325, 1 p.o. every 8 hours as needed.  10. Nitrostat 0.4 mg sublingual every 5 minutes as needed.   FOLLOWUP: With Dr. Quillian QuinceBliss. With primary care physician in 1 to 2 weeks. Follow up with Dr. Juliann Paresallwood on your scheduled appointment in October.   LABORATORY DATA: Cardiac enzymes x 3 negative. EKG showed normal sinus rhythm with right bundle branch block, which is similar to previous EKG. Lipid profile within normal limits except LDL of 107. Chest x-ray: No acute cardiopulmonary disease. Hemoglobin and hematocrit is 11.9 and 35.1. Comprehensive metabolic panel within normal limits except creatinine of 1.6.   CONSULTATIONS: None.  BRIEF SUMMARY OF HOSPITAL COURSE: [name] is a 74 year old, African American female with history of CAD, status post CABG and hypertension, who comes to the Emergency Room after having some chest discomfort. She was admitted with:  1.  Chest pain. Cardiac enzymes remained negative x 3. It did not show any acute changes from previous EKGs. The patient has a lot of stress at home lately with son missing for 2 days. She has been quite anxious on and off and appears to be stable at this time. She remained in sinus rhythm on telemetry. Her aspirin, Plavix and rest of  her cardiac medications were continued. She has a history of CAD, status post CABG in the past. The patient has an appointment to see Dr. Juliann Paresallwood in the next few weeks. She is recommended to keep appointment and was recommended also to call Dr. Glennis Brinkallwood's office if her symptoms recur.  2.  Hypertension, good control, stable. Continue home medications.  3.  Hyperlipidemia. Continue Pravachol.   Hospital stay otherwise remained stable. The patient remained a full code.   TIME SPENT: 40 minutes.   ____________________________ Wylie HailSona A. Allena KatzPatel, MD sap:aw D: 10/22/2012 14:06:35 ET T: 10/22/2012 15:00:22 ET JOB#: [id number]  cc: Tyna Huertas A. Allena KatzPatel, MD, <Dictator> Dwayne D. Callwood, MD L. Clayborn BignessKatherine Bliss, MD PRIMARY CARE PHYSICIAN Willow OraSONA A Muna Demers MD ELECTRONICALLY SIGNED 10/23/2012 14:13

## 2014-05-16 NOTE — H&P (Signed)
PATIENT NAME:  Cynthia [name], Cynthia [name] MR#:  [id number] DATE OF BIRTH:  [birth year]  DATE OF ADMISSION:  06/13/2012  PRIMARY CARE PHYSICIAN: Valetta Close, MD   CHIEF COMPLAINT: Chest pain.   HISTORY OF PRESENT ILLNESS: This is a very pleasant 74 year old female with a known history of coronary artery disease, status post a cardiac catheterization in October 2003 which basically showed occluded grafts, who is here with chest pain. The patient says she was sitting at home about 1:30 when she developed substernal chest pain without any radiation or any shortness of breath, nausea or vomiting. She took aspirin at home, called EMS. She has received 2 nitro and Percocet. Her pain is now a 2 out of 10.  It was an 8 out of 10 and at one point even a 10 out of 10, but she does say that it has eased off. She has not had chest pain in several months. She does not have nitro.  REVIEW OF SYSTEMS:   CONSTITUTIONAL: No fever, fatigue, weakness, weight loss, weight gain.  EYES: No blurred or double vision, no cataracts.  EARS, NOSE AND THROAT:  No ear pain, hearing loss, seasonal allergies, postnasal drip.  RESPIRATORY:  No cough, edema, hemoptysis, COPD.  CARDIOVASCULAR: Chest pain as described above. No palpitations, syncope, edema, arrhythmia.  No dyspnea on exertion.  GASTROINTESTINAL: She denies any nausea, vomiting, diarrhea, abdominal pain, melena or ulcers. She does have GERD.  GENITOURINARY: No dysuria or hematuria.  ENDOCRINE: No polyuria. No thyroid problems, nocturia.  HEMATOLOGIC/LYMPHATIC: She does have anemia of chronic disease. She does not bruise easily.  SKIN: No rash or lesions.  MUSCULOSKELETAL: No pain in the shoulders or knees. No history of gout.  NEUROLOGICAL: She does have a history of TIA.  She had a CEA on the left side. PSYCHIATRIC: No history of anxiety or depression.      PAST MEDICAL HISTORY:  1.  CAD and CABG.  Her last cardiac catheterization was October 2013 by Dr. Clayborn Bigness  which showed an EF of 40%, severe native disease, LAD 75%, circumflex 75%, mid RCA 100% free, LIMA 100% to LAD, SVG to OM 100%, SVG to RCA 100%.  2.  Hyperlipidemia.  3.  Hypertension.  4.  History of TIA, status post carotid endarterectomy.  5.  Chronic renal failure, stage III, with a baseline creatinine of 1.4 to 1.6.   PAST SURGICAL HISTORY:  1.  CABG.  2.  CEA.   ALLERGIES: PENICILLIN CAUSES A RASH.   SOCIAL HISTORY: No tobacco, alcohol or drug use.   FAMILY HISTORY: Positive for hypertension.   MEDICATIONS: 1.  Promethazine 25 mg q. 6 to 8 hours p.r.n.  2.  Pravastatin 20 mg daily.  3.  Percocet 10/650 b.i.d. p.r.n.  4.  Omeprazole 40 mg daily.  5.  Metoprolol 25 mg b.i.d.  6.  Lasix 40 mg daily.  7.  Plavix 75 mg daily.  8.  Aspirin 81 mg daily.   PHYSICAL EXAMINATION:  VITAL SIGNS: Temperature 98.2, pulse is 65, respirations 18, blood pressure 134/70, 98% on room air.  GENERAL: The patient is alert, oriented, not in acute distress.  HEENT: Head is atraumatic. Pupils are round and reactive. Sclerae are anicteric. Mucous membranes are moist. Oropharynx is clear.   NECK:  Supple. There is a scar from a CEA on the left side. No JVD, carotid bruit or enlarged thyroid.  CARDIOVASCULAR: Regular rate and rhythm. No murmurs, gallops or rubs. PMI is hard to appreciate due to  body habitus.  LUNGS:  Clear to auscultation bilaterally without crackles, rales, rhonchi or wheezing.  Symmetrical rise and fall, symmetrical expansion.  ABDOMEN: Bowel sounds are positive. Nontender, nondistended. No hepatosplenomegaly.  No rebound, guarding, rigidity.  EXTREMITIES: No clubbing, cyanosis or edema.  SKIN: No rashes or lesions.  NEUROLOGIC: Cranial nerves II through XII are intact. There are no focal deficits. Motor strength is 5 out of 5 in all extremities.  PSYCHIATRIC:  Judgment and insight are adequate. The patient is alert and oriented x 3.   LABORATORY AND RADIOLOGICAL DATA:  White  blood cells 3.8, hemoglobin 12, hematocrit 36, platelets are 190. Troponin less than 0.02. Sodium 140, potassium 3.7, chloride 107, bicarbonate 26, BUN 21, creatinine 1.46, glucose 102,  calcium 8.6, bilirubin 0.5, alk phos 81, AST is 15, ALT 14, total protein 7.0, albumin 3.5.   EKG showed sinus rhythm. There is no ST elevation or depression. She has some PACs.   ASSESSMENT AND PLAN:  A 74 year old female with a history of coronary artery disease with known occluded graft who presents with unstable angina.   1.  Unstable angina:  The patient says her pain is similar to her pain back when she had her CABG. She has got known occluded grafts from her cardiac catheterization in October 2013. At that time, they recommended medical management.  Today we are going to admit her to telemetry, continue to cycle her cardiac enzymes. I added nitroglycerin to her beta blocker, aspirin and statin medication; also, I will ask Dr. Clayborn Bigness, who is her cardiologist, to see her in consultation. If she continues to have debilitating pain, we may need to use a nitro drip. She is also on Plavix, which we will continue.  2.  Chronic renal failure:  The patient is at baseline, hold any nephrotoxic agents.  3.  Hyperlipidemia: Continue pravastatin and will check lipids.  4.  Hypertension: We will continue metoprolol.   CODE STATUS:  The patient is a FULL CODE.         TIME SPENT:   Approximately 40 minutes.    ____________________________ Donell Beers. Benjie Karvonen, MD spm:cb D: 06/13/2012 16:00:10 ET T: 06/13/2012 17:18:45 ET JOB#: [id number]  cc: Layne Dilauro P. Benjie Karvonen, MD, <Dictator> Marguerita Merles, MD Donell Beers Damel Querry MD ELECTRONICALLY SIGNED 06/13/2012 18:11

## 2014-05-17 NOTE — Consult Note (Signed)
PATIENT NAME:  [name], [name] MR#:  [id number] DATE OF BIRTH:  1940/06/04  DATE OF CONSULTATION:  06/09/2013  CONSULTING PHYSICIAN:  Ezzard StandingPaul Y. Brookie Wayment, MD  REASON FOR REFERRAL: Right upper quadrant pain, nausea, vomiting, and diarrhea.   DESCRIPTION: The patient is a 74 year old black female who presents with right upper quadrant pain associated with nausea, vomiting, and diarrhea that was acute a few days ago. However, she admitted to having abdominal pain on and off in the past since 2004. She was seen by Dr. Niel HummerIftikhar in the past. The day before admission, she had an acute onset of GI symptoms. She felt weak. Her abdominal pain got worse. She was given several doses of Zofran but still had persistent symptoms. Therefore, the patient was admitted for further evaluation.   On ultrasound, she had a dilated bile duct, but the gallbladder was normal. On review, Dr. Niel HummerIftikhar did a colonoscopy in 2011 which was normal. She also had evidence of reflux esophagitis in 2011, for which she was treated briefly with a proton pump inhibitor. She also had a 4-vessel CABG in 2003. As a result, she is on a daily aspirin and Plavix.   PAST MEDICAL HISTORY: Notable for coronary artery disease, hyperlipidemia, hypertension, and thyroid nodule. She has had coronary artery bypass surgery, carotid endarterectomy.   FAMILY HISTORY: Negative.   SOCIAL HISTORY: Negative for smoking or alcohol.   HOME MEDICATIONS: Include Protonix 40 mg daily, metoprolol 25 mg twice a day, nitroglycerin sublingual as needed, Lasix 40 mg daily, enalapril 2.5 mg daily, and Plavix 75 mg daily.  REVIEW OF SYMPTOMS: There are no changes since admission. Please refer to the history for further review.   PHYSICAL EXAMINATION: GENERAL: The patient is in no acute distress.  VITAL SIGNS: She is afebrile. Vital signs are stable.  HEAD AND NECK: Within normal limits.  CARDIAC: Regular rhythm and rate.  LUNGS: Clear bilaterally.  ABDOMEN: Obese abdomen.  It was soft. There was some mild tenderness in the right upper quadrant area. There was no rebound or guarding.  EXTREMITIES: No edema.  NEUROLOGIC: Negative.  SKIN: Normal.   LABORATORY DATA:  Sodium 141, potassium 4.0, chloride 109, BUN 14, creatinine 1.36. Liver enzymes were normal on both days. Troponin level was normal. TSH level is abnormal at 0.359. White count is 3.7, hemoglobin 12.7, hematocrit 37.9. Urinalysis is negative.   ASSESSMENT AND PLAN: This is a patient with chronic right upper quadrant pain with negative evaluation in the past. Now has acute right upper quadrant pain associated with nausea, vomiting, and diarrhea. I suspect she has gastroenteritis. However, the ultrasound does show a dilated bile common bile duct. Since liver enzymes are normal, there is no need to proceed with ERCP at this time. However, we will order an MRCP. If the MRCP is clearly abnormal, then we will schedule ERCP later, once the aspirin and Plavix are held for a minimum of 5 days.   Thank you for the referral.   ____________________________ Ezzard StandingPaul Y. Bluford Kaufmannh, MD pyo:jcm D: 06/10/2013 16:39:25 ET T: 06/10/2013 17:37:36 ET JOB#: [id number]  cc: Ezzard StandingPaul Y. Bluford Kaufmannh, MD, <Dictator> Ezzard StandingPAUL Y Collen Hostler MD ELECTRONICALLY SIGNED 06/12/2013 9:20

## 2014-05-17 NOTE — Consult Note (Signed)
Chief Complaint:  Subjective/Chief Complaint Was not able to get MRI today even with sedation due to claustrophobia. DIarrhea stopped but nausea and pain persists.   VITAL SIGNS/ANCILLARY NOTES: **Vital Signs.:   18-May-15 08:23  Vital Signs Type Q 8hr  Temperature Temperature (F) 97.7  Celsius 36.5  Temperature Source oral  Pulse Pulse 56  Respirations Respirations 18  Systolic BP Systolic BP 147  Diastolic BP (mmHg) Diastolic BP (mmHg) 84  Mean BP 105  Pulse Ox % Pulse Ox % 94  Pulse Ox Activity Level  At rest  Oxygen Delivery Room Air/ 21 %   Brief Assessment:  GEN no acute distress   Cardiac Regular   Respiratory clear BS   Gastrointestinal Mild abd tenderness   Lab Results: Cardiology:  17-May-15 03:22   Ventricular Rate 68  Atrial Rate 68  P-R Interval 166  QRS Duration 126  QT 472  QTc 501  P Axis 75  R Axis 53  T Axis 25  ECG interpretation Sinus rhythm with Premature atrial complexes with Aberrant conduction Right bundle branch block 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 UTI on ABX   Plan Recheck LFT. IF sxs do not improve, consider EGD or possibly ERCP later week.   Electronic Signatures: Lutricia Feilh, Narelle Schoening (MD)  (Signed 18-May-15 19:26)  Authored: Chief Complaint, VITAL SIGNS/ANCILLARY NOTES, Brief Assessment, Lab Results, Assessment/Plan   Last Updated: 18-May-15 19:26 by Lutricia Feilh, Jodelle Fausto (MD)

## 2014-05-17 NOTE — Consult Note (Signed)
Pt still with nausea and RUQ abd pain. EGD did show gastritis. Bx taken. Not sure if pt's plavix was held or not. Diet ordered. Increase protonix to bid. If RUQ pain does not resolve, will set up ERCP later perhaps as outpt since plavix has to be held for 5 days prior. Thanks  Electronic Signatures: Lutricia Feilh, Laiken Sandy (MD)  (Signed on 20-May-15 11:41)  Authored  Last Updated: 20-May-15 11:41 by Lutricia Feilh, Kingdavid Leinbach (MD)

## 2014-05-17 NOTE — Consult Note (Signed)
Chief Complaint:  Subjective/Chief Complaint Overall better though still with some RUQ pain. Did vomit x 1 this AM.   VITAL SIGNS/ANCILLARY NOTES: **Vital Signs.:   21-May-15 08:34  Vital Signs Type Q 8hr  Temperature Temperature (F) 98.2  Celsius 36.7  Temperature Source oral  Pulse Pulse 62  Respirations Respirations 19  Systolic BP Systolic BP 121  Diastolic BP (mmHg) Diastolic BP (mmHg) 72  Mean BP 88  Pulse Ox % Pulse Ox % 95  Pulse Ox Activity Level  At rest  Oxygen Delivery Room Air/ 21 %   Brief Assessment:  GEN no acute distress   Cardiac Regular   Respiratory clear BS   Gastrointestinal mild RUQ tenderness   Lab Results: Hepatic:  19-May-15 04:38   Bilirubin, Total 0.3  Alkaline Phosphatase 61 (45-117 NOTE: New Reference Range 12/14/12)  SGPT (ALT) 18  SGOT (AST) 23  Total Protein, Serum  6.0  Albumin, Serum  3.1  Routine Chem:  19-May-15 04:38   Glucose, Serum  134  BUN 13  Creatinine (comp) 1.25  Sodium, Serum 137  Potassium, Serum 4.2  Chloride, Serum 106  CO2, Serum 27  Calcium (Total), Serum  8.4  Osmolality (calc) 276  eGFR (African American)  50  eGFR (Non-African American)  43 (eGFR values <60mL/min/1.73 m2 may be an indication of chronic kidney disease (CKD). Calculated eGFR is useful in patients with stable renal function. The eGFR calculation will not be reliable in acutely ill patients when serum creatinine is changing rapidly. It is not useful in  patients on dialysis. The eGFR calculation may not be applicable to patients at the low and high extremes of body sizes, pregnant women, and vegetarians.)  Anion Gap  4   Assessment/Plan:  Assessment/Plan:  Assessment Gastritis. RUQ abd pain. Dilated CBD but normal LFT.   Plan Ok for discharge later today if patient can tolerate food. Discharge on PPI bid and some pain/nausea meds. Will try to schedule ERCP next Thurs as outpt if sxs persist. Will need to be off plavix/ASA x 5 days  for the procedure. I will be out starting tomorrow until Tues. Call GI on call if patient requires further stay. thanks.   Electronic Signatures: ,  (MD)  (Signed 21-May-15 11:26)  Authored: Chief Complaint, VITAL SIGNS/ANCILLARY NOTES, Brief Assessment, Lab Results, Assessment/Plan   Last Updated: 21-May-15 11:26 by ,  (MD) 

## 2014-05-17 NOTE — Consult Note (Signed)
Pt seen and examined. Full consult to follow. Pt with acute RUQ abd pain with nausea, vomiting, and diarrhea. No f/c. LFT and CBC normal. CT shows normal GB but dilated CBD. Seen by Dr. Niel HummerIftikhar in the past. According to patient,  RUQ pain is chronic on and offf since 2004? Had normal colon in 2011 but reflux esophagitis iin 2011. Pt had CABG x 4 in 2013. On ASA/plavix. Pt may have gastroenteritis only. However, will order MRCP. If MRCP clearly abnormal, then will need to proceed with ERCP once ASA/plavix  held for min 5 days.  Will follow. Thanks  Electronic Signatures: Lutricia Feilh, Nakiyah Beverley (MD)  (Signed on 17-May-15 09:19)  Authored  Last Updated: 17-May-15 09:19 by Lutricia Feilh, Nylah Butkus (MD)

## 2014-05-17 NOTE — Discharge Summary (Signed)
PATIENT NAME:  Cynthia [name] MR#:  [id number] DATE OF BIRTH:  Feb 26, 1940  DATE OF ADMISSION:  06/08/2013 DATE OF DISCHARGE:  06/13/2013  ADMITTING DIAGNOSES: Dehydration, nausea, vomiting, diarrhea right upper quadrant abdominal pain, thyroid nodule.   DISCHARGE DIAGNOSES:  1. Right lower quadrant abdominal pain of unclear etiology at this time, status post esophagogastroduodenoscopy on 06/12/2013 by Dr. Bluford Kaufmannh revealing gastritis.  2. Dilated biliary ducts on abdominal ultrasound.  3. Acute gastroenteritis with nausea, vomiting, diarrhea, and dehydration resolved.  4. History of coronary artery disease.  5. Hypertension.  6. Hyperlipidemia.  7. Gastroesophageal reflux disease.  8. Thyroid nodule.   DISCHARGE CONDITION: Stable.   DISCHARGE MEDICATIONS: The patient is to resume metoprolol tartrate 25 mg p.o. twice daily, Nitrostat 0.4 mg every 5 minutes as needed, Enalapril 2.5 mg p.o. twice daily, Protonix 40 mg p.o. twice daily. The patient is not to take Plavix or furosemide at this time.   HOME OXYGEN: None.   DIET: Two gram salt, low-fat, low-cholesterol, mechanical soft with low residual diet.   ACTIVITY LIMITATIONS: As tolerated.    FOLLOWUP APPOINTMENT: With Dr. Quillian QuinceBliss in 2 days after discharge, Dr. Bluford Kaufmannh in 1 week after discharge.   CONSULTANTS: Care management, social work, Dr. Bluford Kaufmannh.   RADIOLOGIC STUDIES: CT scan of head without contrast, 05/16/ 2015, revealed trace fluid in the left mastoid air cells, atrophy without acute intracranial pathology. CT of abdomen and pelvis with contrast, 05/16/ 2015, revealing short segment of mid sigmoid colon wall thickening. While this finding may merely represent a feature of peristalsis, a short segment neoplasm could present in this manner.  If the patient has not had a recent colonoscopy, direct visualization of  this area would be warranted to exclude the possibility of a short segment carcinoma in this area. There is a stable ventral hernia at the  level of the dome of liver containing only fat. No renal or urethral calculus. No hydronephrosis. No bowel obstruction or abscess. Appendix appeared normal.  There are scattered foci of coronary artery calcification. Ultrasound of abdomen  limited survey, 06/08/2013, showing enlargement of visualized common bile duct. This finding raises concern of potentially distal obstructing lesion.  Note there is no demonstrable intrahepatic biliary duct dilatation.  No gallbladder pathology was appreciable.  There is fatty change in the liver.  No focal liver lesions were identified.  It must be cautioned that the sensitivity of ultrasound for focal liver lesions is diminished given underlying fatty change.  Given the bile duct dilatation it was undefinable etiology on this study.  It may be reasonable to consider correlation with MRCP or ERCP to assess further.   HOSPITAL COURSE:  The patient is a 74 year old Caucasian female with past medical history significant for history of chronic right abdominal pains who presents to the hospital with complaints of nausea, vomiting, diarrhea, and worsening of her abdominal pain. Please refer to Dr. Henriette CombsJohnson's admission on 06/08/2013. On arrival to the hospital, patient's temperature was 98.6, pulse was 88, respiration rate was 18, blood pressure 139/88, saturation was not measured.   PHYSICAL EXAMINATION:  Remarkable for some mild tenderness in the right upper quadrant, but no rebound or guarding were noted and no hepatosplenomegaly was noted.   RADIOLOGICAL DATA: Patient's CT scan of abdomen revealed mild thickening of mid sigmoid colon and ultrasound revealed dilatation of common bile duct as mentioned above.    LABORATORY DATA:  Patient's lab data done on arrival to the Emergency Room revealed elevation of creatinine to 1.36,  glucose 109, otherwise BMP was unremarkable. Liver enzymes were normal. Cardiac enzymes x 4 were within normal limits. The patient's TSH was initially  low at 0.359; however, repeated level was 2.36 with normal thyroxine level of 1.08. The patient's CBC was within normal limits with white blood cell count 3.7, hemoglobin 12.7, platelet count 192,000. Urinalysis showed 52 white blood cells; however, patient's urine cultures showed only 1000 colony-forming units of gram-negative rods. EKG showed sinus rhythm at 68 beats per minute, premature atrial complexes with aberrant conduction, right bundle branch block, and questionable inferior infarction which was already cited in September 2009, nonspecific ST-T changes, T wave abnormality, consider lateral ischemia, according to EKG criteria. The patient was admitted to the hospital for further evaluation. She was evaluated by Dr. Bluford Kaufmann who proceeded to upper GI endoscopy on 06/12/2013, which was remarkable only for  gastritis . Dr. Bluford Kaufmann felt the patient should be continued on PPIs twice daily. He felt that patient's right upper quadrant pain could have been related to acute gastroenteritis which patient just experienced.  He recommended to stop her aspirin as well as Plavix and get an ERCP done as outpatient. The patient was restarted on diet and she was able to tolerate diet and she is being discharged home on the 06/13/2013. She is to follow up with Dr. Bluford Kaufmann for consideration of ERCP. Of note, she was not able to tolerate MRCP.  In regard to dilated bile ducts on ultrasound, it was of unclear etiology as mentioned above.  Patient was attempted to get MRCP done while she was here in the hospital; however, she was not able to tolerate MRI and so ERCP is being planned by Dr. Bluford Kaufmann in approximately 1 week after discharge. Meanwhile, the patient is to stop her Plavix until ERCP is performed. In regard to acute gastroenteritis, patient presented to the hospital with nausea, vomiting, as well as diarrhea.  Her nausea, vomiting, as well as diarrhea subsided and it was felt to be gastroenteritis-related, possibly viral. For chronic  medical problems such as coronary artery disease, hypertension, hyperlipidemia, gastroesophageal reflux disease. The patient is to continue her outpatient management. Of note, patient's PPI was advanced to twice daily dose.  In regard to thyroid nodules, we hoped that patient would be able to undergo a thyroid nodule biopsy while she was here in the hospital; however, because of insurance issues, patient may benefit from thyroid nodule biopsy as outpatient. Upon discussion with Dr. Quillian Quince, patient is to follow up with Dr. Quillian Quince and reschedule her thyroid nodule biopsy. She meanwhile is to hold her Plavix as previously ordered. The patient's vital signs on the day of discharge, 06/13/2013, temperature was 98.2, pulse was 62, respiration rate was 18-19, blood pressure 121/72, saturation was 95% on room air at rest.   TIME SPENT:  40 minutes.   ____________________________ Katharina Caper, MD rv:dd D: 06/13/2013 15:40:01 ET T: 06/13/2013 19:23:24 ET JOB#: [id number]  cc: Katharina Caper, MD, <Dictator> Burley Saver, MD Ezzard Standing. Bluford Kaufmann, MD Katharina Caper MD ELECTRONICALLY SIGNED 06/27/2013 19:51

## 2014-05-17 NOTE — H&P (Signed)
PATIENT NAME:  [name], [name] MR#:  [id number] DATE OF BIRTH:  1940/05/08  DATE OF ADMISSION:  06/08/2013  PRIMARY CARE PHYSICIAN: Burley SaverL. Katherine Bliss, MD  CHIEF COMPLAINT: Nausea, vomiting, diarrhea and abdominal pain.   HISTORY OF PRESENT ILLNESS: This is a 74 year old female who has a history of right upper quadrant abdominal pain. This has been going on almost every day off and on since 2004. She has seen Dr. Niel HummerIftikhar as an outpatient for workup, but I can find no records in the hospital computer on this. She takes hydrocodone at night for the pain, but nothing during the day. Yesterday, she had sudden onset of nausea, vomiting and diarrhea. She has been feeling weak. This exacerbated her right upper quadrant abdominal pain. She has been given several doses of Zofran here in the ER, but still has nausea and vomiting. There has not been any blood in the emesis. On ultrasound, it was found that she had dilated biliary ducts, so we are going to go ahead and admit her for the intractable nausea and vomiting and some dehydration.   PAST MEDICAL HISTORY:  1. Chronic right upper quadrant abdominal pain.  2. Coronary artery disease.  3. Hyperlipidemia.  4. Hypertension.  5. GERD. 6. Thyroid nodule.   PAST SURGICAL HISTORY:  1. CABG.  2. Carotid endarterectomy.  3. Colonoscopy 5 years ago.   FAMILY HISTORY: There is no coronary artery disease or diabetes.   SOCIAL HISTORY: Does not smoke, does not drink alcohol.   CURRENT MEDICATIONS:  1. Protonix 40 mg daily.  2. Nitrostat 0.4 mg p.r.n. sublingual.  3. Metoprolol 25 mg b.i.d. 4. Lasix 40 mg daily.  5. Enalapril 2.5 mg daily.  6. Plavix 75 mg daily.   REVIEW OF SYSTEMS:  CONSTITUTIONAL: She has had some fever.  EYES: No blurred vision.  ENT: No hearing loss.  CARDIOVASCULAR: No chest pain.  PULMONARY: No shortness of breath.  GASTROINTESTINAL: She has had nausea, vomiting, diarrhea and right upper quadrant pain.  GENITOURINARY: No  dysuria.  ENDOCRINE: No heat or cold intolerance.  INTEGUMENTARY: No rash.  MUSCULOSKELETAL: No weakness.  NEUROLOGIC: No numbness or weakness.  PSYCHIATRIC: No depression.   PHYSICAL EXAMINATION:  VITAL SIGNS: Temperature is 98.6, pulse 88, respiration 18, blood pressure 139/88.  GENERAL: This is a well-nourished black female who is in obvious discomfort.  HEENT: The pupils are equal, round and reactive to light. There is no scleral icterus. Her oral mucosa is dry. Oropharynx is clear.  NECK: Supple. No JVD or lymphadenopathy. She has a palpable mass on the right side of her thyroid that is not firm.  CARDIOVASCULAR: Regular rate and rhythm. There are no murmurs.  LUNGS: Clear to auscultation. No dullness to percussion. She is not using accessory muscles.  ABDOMEN: Obese, nondistended. Bowel sounds are positive. No hepatosplenomegaly. There is some tenderness in the right upper quadrant, with no rebound or guarding.  EXTREMITIES: There is no edema. There is full range of motion.  NEUROLOGIC: Cranial nerves II through XII appear to be intact. She is alert and oriented x4.  SKIN: Moist, with no rash.   LABORATORY DATA: BUN is 18, creatinine 1.36, sodium 138, potassium 3.9, total bilirubin is 0.5, SGPT is 15, SGOT is 22. White blood cells of 3.7, hemoglobin is 12.7.   IMAGING: CT scan of the abdomen showed some mild thickening of the mid sigmoid colon. Ultrasound showed an enlarged distal common bile duct at 12 mm.   ASSESSMENT AND PLAN:  1.  Dehydration. The patient is obviously dehydrated from nausea, vomiting and diarrhea. Creatinine has bumped up a little bit. We are going to aggressively hydrate her with IV fluids and maintain her on fluids until she is able to take p.o.  2. Nausea, vomiting and diarrhea, etiology unknown at this point. It may or may not have anything to do with her dilatated biliary ducts. I will give her conservative treatment and supportive care. She is afebrile now  with no white count, so I do not see the need for any antibiotics at this point.  3. Right upper quadrant abdominal pain. She has had this chronically, been worked up as an outpatient. I am not sure what all the workup has been done, but with the dilatated biliary duct, I am going to ask GI to see her for potential MRCP and further workup.  4. Thyroid nodule. On ultrasound as an outpatient, it measured 4 x 6 cm. According to her husband, she is supposed to get a biopsy of this next week. Will go ahead and check thyroid function and follow up as necessary.   TIME SPENT ON ADMISSION: 45 minutes.  ____________________________ Gracelyn Nurse, MD jdj:lb D: 06/08/2013 12:35:13 ET T: 06/08/2013 12:49:27 ET JOB#: [id number]  cc: Gracelyn Nurse, MD, <Dictator> Burley Saver, MD Gracelyn Nurse MD ELECTRONICALLY SIGNED 06/09/2013 11:03

## 2014-05-18 ENCOUNTER — Observation Stay
Admit: 2014-05-18 | Disposition: A | Discharge status: Home / Self Care | Payer: Self-pay | Attending: Internal Medicine | Admitting: Internal Medicine

## 2014-05-18 LAB — BASIC METABOLIC PANEL
ANION GAP: 9 (ref 7–16)
BUN: 24 mg/dL — ABNORMAL HIGH
CALCIUM: 8.6 mg/dL — AB
CHLORIDE: 99 mmol/L — AB
CO2: 26 mmol/L
Creatinine: 1.36 mg/dL — ABNORMAL HIGH
EGFR (African American): 45 — ABNORMAL LOW
GFR CALC NON AF AMER: 39 — AB
Glucose: 127 mg/dL — ABNORMAL HIGH
POTASSIUM: 3.6 mmol/L
Sodium: 134 mmol/L — ABNORMAL LOW

## 2014-05-18 LAB — CBC
HCT: 39.5 % (ref 35.0–47.0)
HGB: 12.8 g/dL (ref 12.0–16.0)
MCH: 32.2 pg (ref 26.0–34.0)
MCHC: 32.5 g/dL (ref 32.0–36.0)
MCV: 99 fL (ref 80–100)
Platelet: 186 10*3/uL (ref 150–440)
RBC: 3.98 10*6/uL (ref 3.80–5.20)
RDW: 13.9 % (ref 11.5–14.5)
WBC: 6.8 10*3/uL (ref 3.6–11.0)

## 2014-05-18 LAB — TROPONIN I
Troponin-I: <0.03 ng/mL
Troponin-I: <0.03 ng/mL

## 2014-05-18 LAB — HEPATIC FUNCTION PANEL A (ARMC)
AST: 15 U/L
Albumin: 4 g/dL
Alkaline Phosphatase: 69 U/L
BILIRUBIN TOTAL: 0.6 mg/dL
Bilirubin, Direct: 0.1 mg/dL
Indirect Bilirubin: 0.5
SGPT (ALT): 11 U/L — ABNORMAL LOW
Total Protein: 7.1 g/dL

## 2014-05-18 LAB — CK-MB
CK-MB: 1.8 ng/mL
CK-MB: 2.2 ng/mL
CK-MB: 2 ng/mL

## 2014-05-18 IMAGING — CR DG CHEST 1V PORT
1 series · 1 of 1 positions shown · non-contrast
Comparison: none

REASON FOR EXAM: Chest Pain
COMMENTS:

[ap]
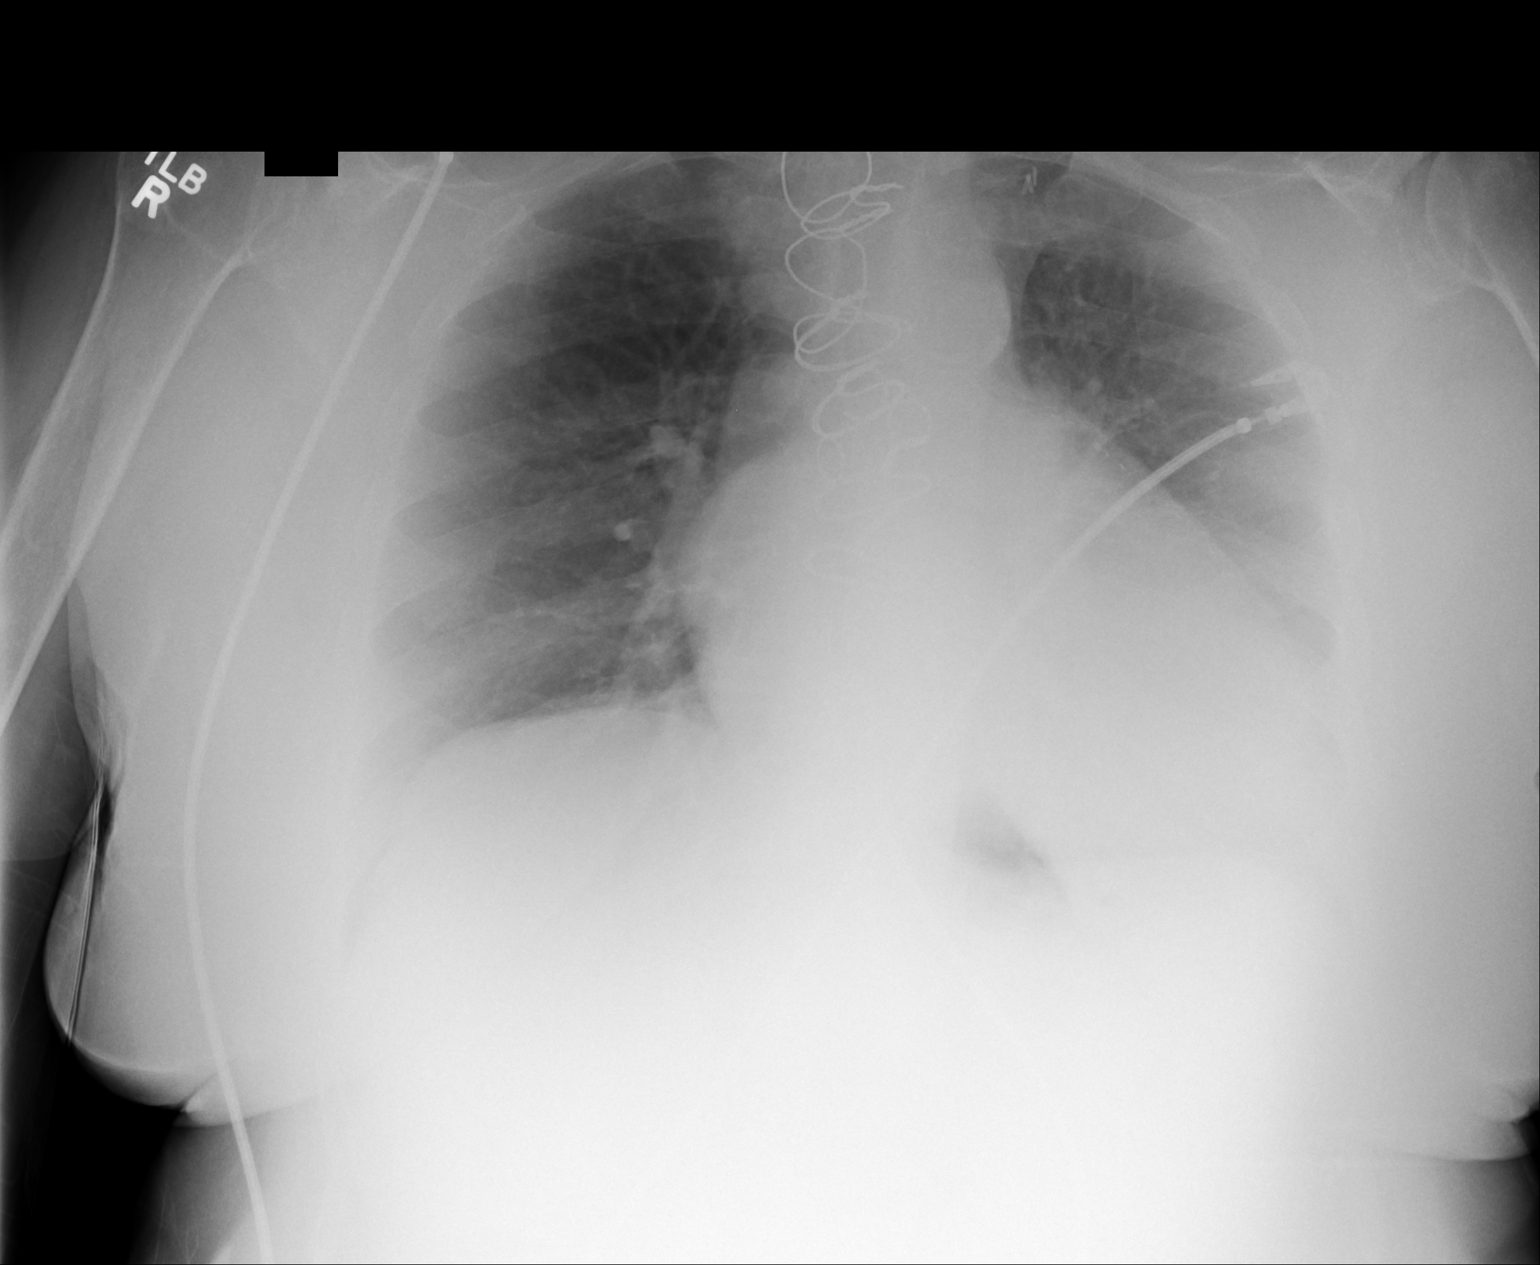

[1 of 1 positions shown; findings below may reference images not displayed]

PROCEDURE:     DXR - DXR PORTABLE CHEST SINGLE VIEW  - November 06, 2012 [DATE]

RESULT:     Comparison made to the study October 18, 2012.

The lungs are reasonably well inflated for this apical lordotic positioned
film. The cardiac silhouette is normal in size. The patient has undergone
previous median sternotomy and CABG. The pulmonary vascularity is not
engorged. There is no pleural effusion.
IMPRESSION: There is no definite evidence of acute cardiopulmonary
abnormality. A followup PA and lateral chest x-ray would be of value if the
patient has persistent cardiopulmonary symptoms.

[REDACTED]

## 2014-05-18 NOTE — H&P (Signed)
PATIENT NAME:  [name], [name] MR#:  [id number] DATE OF BIRTH:  [birth year]  DATE OF ADMISSION:  02/08/2011  PRIMARY CARE PHYSICIAN:  Dr. Clayborn Bigness REQUESTING PHYSICIAN:  Dr. Lorenso Courier  CHIEF COMPLAINT: Chest pain.   HISTORY OF PRESENT ILLNESS: The patient is a 74 year old female with a known history of coronary artery disease status post stenting times one, hypertension, and hyperlipidemia who is being admitted for suspected unstable angina. The patient started having chest pain this morning when she woke up, mainly under her left breast radiating around the midsternal region. It did not get relieved and she decided to come to the Emergency Department. While in the ED she is still complaining of 5/10 chest pain. She was given nitroglycerin and aspirin, which seemed to help her a little bit, but she still continued to have chest pain for which she is being admitted for further evaluation and management. She denies any other associated symptoms at this time.   PAST MEDICAL HISTORY: 1. Coronary artery disease, status post one stent in 2006 by Dr. Juliann Pares.  2. Hypertension.  3. Hyperlipidemia.  4. History of transient ischemic attack.   PAST SURGICAL HISTORY:  1. Left carotid endarterectomy.  2. Stenting times one in the proximal RCA.  MEDICATIONS AT HOME:  1. Aspirin 325 mg p.o. daily. 2. Pravastatin once daily.  3. Omeprazole 40 mg p.o. daily.   ALLERGIES: Penicillin causes rash.   SOCIAL HISTORY: Smokes. One pack of cigarettes lasts about 3 to 4 days. She has been smoking for the last 40 to 50 years. She is on disability. 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 Denies any alcohol use.   FAMILY HISTORY: Grandmother had hypertension.   REVIEW OF SYSTEMS:  CONSTITUTIONAL: No fever, fatigue, or weakness. EYES: No blurred or double vision.  ENT: No tinnitus or ear pain. RESPIRATORY: No cough, wheezing, or hemoptysis.  CARDIOVASCULAR: Positive for chest pain. No orthopnea or  edema. Positive for coronary artery disease. GI: No nausea, vomiting, or diarrhea. GU: No dysuria or hematuria.  ENDOCRINE: No polyuria or nocturia. HEMATOLOGIC: No anemia or easy bruising. SKIN: No rash or lesion. MUSCULOSKELETAL: No arthritis or muscle cramps. NEUROLOGIC: No tingling, numbness, or weakness. History of transient ischemic attack. PSYCHIATRIC: No history of anxiety or depression.   PHYSICAL EXAMINATION:  VITAL SIGNS: Temperature 97.9, heart rate 82 per minute, respirations 20 per minute, blood pressure 127/72 mmHg. She is saturating 96% on room air.   GENERAL: The patient is a 74 year old female lying in the bed comfortably without any acute distress.   EYES: Pupils equal, round, reactive to light and accommodation. No scleral icterus. Extraocular muscles intact.   HENT: Head atraumatic, normocephalic. Oropharynx and nasopharynx clear.   NECK: Supple. No jugular venous distention. No thyroid enlargement or tenderness.   LUNGS: Clear to auscultation bilaterally. No wheezing, rales, rhonchi, or crepitation.   HEART: S1, S2 normal. No murmur, rubs, or gallop.   ABDOMEN: Soft, nontender, nondistended. Bowel sounds present. No organomegaly or mass.   EXTREMITIES: No pedal edema, cyanosis, or clubbing.   NEUROLOGIC: Nonfocal examination. Cranial nerves III through XII intact. Muscle strength is five out of five in all extremities. Sensation intact.   PSYCH: The patient is oriented to time, place, and person.   SKIN: No obvious rash, lesion, or ulcer.  LABORATORY PANEL: Normal CBC. Normal first set of cardiac enzymes. Normal basic metabolic panel except BUN of 24, creatinine 1.45.   Chest x-ray showed no acute cardiopulmonary disease. EKG  shows normal sinus rhythm, no major ST-T changes.   IMPRESSION AND PLAN:  1. Chest pain:  Seems more muscular in nature as she did have minimal tenderness on exam under her left breast region although does have strong coronary artery  disease along with ongoing smoking. We will admit her to telemetry. Rule her out with serial cardiac enzymes. Continue aspirin nitroglycerin, beta blocker, and statin. Obtain stress test in the morning. If that is negative she should be able to go home tomorrow.  2. Coronary artery disease, status post stenting: We will monitor with management as above.  3. Hyperlipidemia: We will check fasting lipid profile. Continue pravastatin.  4. Acute renal insufficiency, likely prerenal: We will hydrate her with IV fluids and monitor her renal function.  5. Tobacco abuse: She was counseled for about three minutes.  She is trying to quit. Denies any need for nicotine replacement therapy while in the hospital.   TOTAL TIME TAKING CARE OF THIS PATIENT: 45 minutes.    ____________________________ Ellamae SiaVipul S. Sherryll BurgerShah, MD vss:bjt D: 02/08/2011 12:17:20 ET T: 02/08/2011 12:25:39 ET JOB#: [id number]  cc: Ansley Stanwood S. Sherryll BurgerShah, MD, <Dictator> Burley SaverL. Katherine Bliss, MD Ellamae SiaVIPUL S [facility] MD ELECTRONICALLY SIGNED 02/10/2011 10:48

## 2014-05-18 NOTE — Consult Note (Signed)
PATIENT NAME:  Cynthia [name], Cynthia [name] MR#:  [id number] DATE OF BIRTH:  08-14-1940  DATE OF CONSULTATION:  02/09/2011  REFERRING PHYSICIAN:  Clayborn BignessKatherine Bliss, MD, Dr. Sherryll BurgerShah, [facility]  CONSULTING PHYSICIAN:  Shawnya Mayor D. Tyjah Hai, MD  INDICATION: Chest pain and angina.   HISTORY OF PRESENT ILLNESS: Cynthia [name] is a 74 year old white female with history of coronary artery disease status post stenting, hypertension and hyperlipidemia, admitted with  suspected unstable angina. She had been having chest pain on and off mainly under her left breast, radiating around to the midsternum. The  patient did not get any relief so she decided to come to the Emergency Room for evaluation. In the Emergency Department  pain was 5/10. She was given nitroglycerin and aspirin, which seemed to help her a little but continued to have significant pain. She is being admitted for further evaluation and care with known coronary disease with new or worsening angina.   REVIEW OF SYSTEMS: No blackout spells or syncope. No nausea or vomiting. No fever, no chills, no sweats. No weight loss. No weight gain. No hemoptysis or hematemesis. No bright red blood per rectum.   PAST MEDICAL HISTORY:  1. Coronary artery disease.  2. Hypertension.  3. Hyperlipidemia.  4. Transient ischemic attack.  5. Peripheral vascular disease.   PAST SURGICAL HISTORY:  1. Carotid left endarterectomy. 2. Stenting right coronary artery.   MEDICATIONS:  1. Aspirin 325 mg daily.  2. Pravastatin once a day.  3. Omeprazole 40 mg a day.   ALLERGIES: Penicillin.   FAMILY HISTORY: Hypertension.   SOCIAL HISTORY: Smoker on disability. No alcohol consumption.   PHYSICAL EXAMINATION:  VITAL SIGNS: Blood pressure 130/70, pulse 80, respiratory rate 12, afebrile.   HEENT: Normocephalic, atraumatic. Pupils equal and reactive to light.   NECK: Supple. No significant jugular venous distention, bruits, or adenopathy.   LUNGS: Clear to auscultation and percussion. No  significant wheezing, rhonchi, or rales.  HEART:  Regular rate and rhythm. Positive S4. Systolic ejection murmur left sternal border. PMI nondisplaced.   ABDOMEN: Benign.   EXTREMITIES: Within normal limits.   NEUROLOGIC: Intact.   SKIN: Normal.   LABS/STUDIES: CBC normal. Cardiac enzymes are normal. Basic chemistries are normal. BUN 24, creatinine 1.45. Chest x-ray: No acute disease.   EKG: Normal sinus rhythm with nonspecific findings.   ASSESSMENT:  1. Angina.  2. Coronary artery disease.  3. Possible unstable angina. 4. Chest pain. 5. Renal insufficiency. 6. Hyperlipidemia. 7. Smoking. 8. Peripheral vascular disease.   PLAN: Agree with admit. Rule out for myocardial infarction. Follow cardiac enzymes. Follow up EKGs. Follow up telemetry. Aspirin therapy. Continue blood pressure control. I agree with statin therapy.  Gastroesophageal reflux disease therapy. Would consider adding a beta blocker in the interim. We will hold off on ACE because of her renal insufficiency. Will probably need to rule out renal artery stenosis with renal insufficiency at this point with a long history of peripheral vascular disease.  Echocardiogram would be helpful.  She will probably need cardiac catheterization with her known coronary disease to further evaluate unstable acute coronary syndrome picture. We will continue to base further evaluation on further studies. . Hopefully, the patient will continue to improve.   ____________________________ Bobbie Stackwayne D. Juliann Paresallwood, MD ddc:bjt D: 02/25/2011 10:35:35 ET T: 02/25/2011 13:44:11 ET JOB#: [id number]  cc: Roderick Calo D. Juliann Paresallwood, MD, <Dictator> Alwyn PeaWAYNE D Joshalyn Ancheta MD ELECTRONICALLY SIGNED 03/15/2011 10:26

## 2014-05-18 NOTE — Discharge Summary (Signed)
PATIENT NAME:  Cynthia [name], Cynthia [name] MR#:  [id number] DATE OF BIRTH:  1940/08/13  DATE OF ADMISSION:  02/09/2011 DATE OF DISCHARGE:  02/11/2011  DISPOSITION: Transferred to [facility] on 02/11/2011.   ADMITTING DIAGNOSIS: Chest pain.   DISCHARGE DIAGNOSES:  1. Unstable angina. Cardiac enzymes negative for cardiac injury status post cardiac catheterization on 02/10/2011 by Dr. Juliann Pares which revealed severely depressed left ventricular function, ejection fraction of 25%, multivessel coronary artery disease, coronary artery bypass graft was recommended.  2. Hypertension. 3. Hyperlipidemia.  4. Tobacco abuse.  5. Chronic obstructive pulmonary disease.  6. Chronic renal insufficiency improved on intravenous fluids.   DISCHARGE CONDITION: Stable.   DISCHARGE MEDICATIONS: Patient is to resume her outpatient medications which are:  1. Aspirin 325 mg p.o. daily. 2. Pravastatin, unknown dose daily.  3. Percocet 5/325 mg unknown dose daily.  4. Omeprazole 40 mg p.o. daily.  5. Phenergan 25 mg, unknown frequency.  DIET:  low fat, low cholesterol.   PHYSICAL ACTIVITY LIMITATIONS: As tolerated, however, patient was advised not to get into any exertional activity until she has her procedure.    FOLLOW UP: Follow-up appointment with Dr. Juliann Pares in one week after discharge, Dr. Quillian Quince in 2 days after discharge.   CONSULTANTS:  1. Dr. Juliann Pares. 2. Care management.   LABORATORY, DIAGNOSTIC AND RADIOLOGICAL DATA: Chest, portable, single view 02/08/2011 showed no acute cardiopulmonary disease. Chest, PA and lateral 02/09/2011 showed hypoinflation, no acute abnormality evident. Myoview stress test was cancelled.   Cardiac catheterization result by Dr. Juliann Pares summary: Analysis of regional contractile function demonstrated moderate anterobasilar hypokinesis, moderate anterolateral hypokinesis, moderate apical hypokinesis, severe diaphragmatic hypokinesis and posterior basilar akinesis.  Ejection fraction estimated was 35%. Summary: Moderate to severely depressed left ventricular function, ejection fraction of 35%. Anteroposterior akinesis. Coronaries: Left main okay. Left anterior descending 90%, proximal 50%, left circ 75%, mid diffuse RCA 100% proximal. Limited collatorals of distal RCA. Bilateral renals are good. He recommended evaluation for possible coronary artery bypass graft and sent to [facility] according to Dr. Juliann Pares.   Patient's EKG showed normal sinus rhythm, no acute ST-T changes. Chest x-ray was unremarkable. Patient's lab data revealed elevated BUN and creatinine to 24 and 1.45, glucose 105, otherwise unremarkable BMP. Patient's estimated GFR for non-African American was 35, for African American patient was 46. Cardiac enzymes, first set, as well as subsequent two more sets were unremarkable. Patient's white blood cell count was normal at 5.4, hemoglobin 12.5, platelets 239. Urinalysis was unremarkable   HISTORY OF PRESENT ILLNESS: Patient is a 74 year old African American female with history of hypertension, hyperlipidemia as well as coronary artery disease, status post one stent placed in 2006 by Dr. Juliann Pares who presented to the hospital with complaints of chest pain. Please refer to Dr. Patricia Pesa admission note on 02/08/2011. On arrival to the hospital patient's temperature 97.9, pulse 82, respiration rate 20, blood pressure 127/72, saturation 96% on room air. Physical examination was unremarkable.   HOSPITAL COURSE: Patient was admitted to the hospital. Her cardiac enzymes were cycled and initially decision was made to get a stress test, however, as patient went to get her stress test she became very claustrophobic and her test was canceled. Dr. Juliann Pares was consulted and Dr. Juliann Pares felt that patient would benefit from cardiac catheterization, evaluation of her coronary arteries now since she has been having recurrent chest pains. Patient underwent cardiac  catheterization on 02/10/2011 which revealed reduced left ventricular function as well as hypokinesis as well as akinesis and  moderate to severe coronary artery disease and coronary artery bypass grafting was recommended. [facility]. Bed became available on 02/11/2011 where patient will be transferred for coronary artery bypass grafting evaluation. Her vitals on day of discharge were stable with temperature 97.8, pulse 74, respiration rate 18, blood pressure 145/91, saturation 95% on room air at rest. Patient was noted to have renal sufficiency as mentioned above. On arrival to the Emergency Room, she was given IV fluids with bicarbonate and her renal insufficiency resolved by 02/10/2011. Post cardiac catheterization her kidney function remained stable with creatinine of 1.15. Her sodium level was found to be slightly high at 146 on 02/11/2011, however, that was felt to be very likely related to her IV fluid infusion. Patient was noted to be anemic with rehydration with hemoglobin level of 10.7 on day of discharge, 02/11/2011. It is recommended to follow patient's hemoglobin levels and make decision about transfusion if necessary. No acute bleeding was noted. In regards to patient's other medical problems such as history of hypertension, patient is to continue her outpatient medications. She was slightly hypertensive here in the hospital, however, she was very stressed out and she was very anxious prior to leaving [facility]. For history of transient ischemic attack in the past, patient is to continue aspirin as well as pravastatin. For hyperlipidemia, patient is to resume her Pravastatin dose. Patient's lipid panel was checked while she was in the hospital and LDL was found to be 83, total cholesterol was found to be 139, and triglycerides were 75 with HDL of 41. In regards to tobacco abuse,  patient had discussions with discharging physician and agreed to stop smoking for good. Patient is being discharged with above-mentioned medications and follow up.   TIME SPENT: 40 minutes.  ____________________________ Katharina Caperima Tyann Niehaus, MD rv:cms D: 02/11/2011 20:19:33 ET T: 02/12/2011 12:29:01 ET JOB#: [id number]  cc: Katharina Caperima Jerrica Thorman, MD, <Dictator> Burley SaverL. Katherine Bliss, MD Elizebath Wever MD ELECTRONICALLY SIGNED 03/06/2011 20:18

## 2014-05-18 NOTE — Consult Note (Signed)
Chief Complaint:   Subjective/Chief Complaint Pt states to be doing well. no sob still has angina s/p cath yesterday awiting transfer to [facility] for possible CABG vs complex PCI.   VITAL SIGNS/ANCILLARY NOTES: **Vital Signs.:   18-Jan-13 13:48   Vital Signs Type Routine   Temperature Temperature (F) 97.8   Celsius 36.5   Temperature Source oral   Pulse Pulse 74   Pulse source per Dinamap   Respirations Respirations 18   Systolic BP Systolic BP 062   Diastolic BP (mmHg) Diastolic BP (mmHg) 91   Mean BP 109   BP Source Dinamap   Pulse Ox % Pulse Ox % 95   Pulse Ox Activity Level  At rest   Oxygen Delivery Room Air/ 21 %  *Intake and Output.:   Shift 18-Jan-13 15:00   Grand Totals Intake:  600 Output:  500    Net:  100 24 Hr.:  100   Oral Intake      In:  600   Urine ml     Out:  500   Length of Stay Totals Intake:  4967 Output:  3762    Net:  -8315   Brief Assessment:   Cardiac Regular  murmur present  -- LE edema  -- JVD  --Gallop    Respiratory normal resp effort  clear BS  no use of accessory muscles  rhonchi    Gastrointestinal Normal    Gastrointestinal details normal Soft  Nontender  Nondistended  No masses palpable  Bowel sounds normal  No rigidity   Routine Hem:  18-Jan-13 05:13    Hemoglobin (CBC) 10.7  Routine Chem:  18-Jan-13 05:13    Glucose, Serum 85   BUN 12   Creatinine (comp) 1.15   Sodium, Serum 146   Potassium, Serum 4.1   Chloride, Serum 108   CO2, Serum 27   Calcium (Total), Serum 8.8   Anion Gap 11   Osmolality (calc) 290   eGFR (African American) >60   eGFR (Non-African American) 50   Radiology Results: XRay:    15-Jan-13 10:01, Chest Portable Single View   Chest Portable Single View    REASON FOR EXAM:    Chest Pain  COMMENTS:       PROCEDURE: DXR - DXR PORTABLE CHEST SINGLE VIEW  - Feb 08 2011 10:01AM     RESULT: Comparison: 08/18/2010    Findings:  Heart and mediastinum are stable. Slight increased density at the lung    bases is felt to be related to the overlying soft tissue. No focal   pulmonary opacities.    IMPRESSION:   No acute cardiopulmonary disease.      Verified By: Gregor Hams, M.D., MD    16-Jan-13 09:12, Chest PA and Lateral   Chest PA and Lateral    REASON FOR EXAM:    chest pain  COMMENTS:       PROCEDURE: DXR - DXR CHEST PA (OR AP) AND LATERAL  - Feb 09 2011  9:12AM     RESULT: Comparison is made to the study of 08 February 2011.    Cardiac monitoring electrodes are present. The projection is lordotic.   The cardiac silhouette is likely within normal limits. There is   hypoinflation without infiltrate, edema, effusion or pneumothorax.    IMPRESSION:  Hypoinflation. No acute abnormality evident.          Verified By: Sundra Aland, M.D., MD  Cardiac Catherization:    17-Jan-13 14:50, Cardiac Catheterization  Cardiac Catheterization    [facility]  [address] [place], [place] [address]  [phone]     Cardiovascular Catheterization Comprehensive Report     Patient: Cynthia [name]  Study date: 02/10/2011  MR number: [id number]  Account number: [id number]     DOB: August 18, 1940  Age: 74 years  Gender: Female  Race: Black  Height: 70.9 in  Weight: 237.6 lb     Interventional Cardiologist:  Lujean Amel, MD     SUMMARY:     -CARDIAC STRUCTURES: Analysis of regional contractile function  demonstrated moderate anterobasal hypokinesis, moderate anterolateral  hypokinesis, moderate apical hypokinesis, severe diaphragmatic  hypokinesis, and posterobasal akinesis. EF estimated was 35 %.     -Summary: Mod/Severely depessed LVF  EF=35%  Inf/Post AK  Cors  LMain ok  LAD 90% prox/50%  Circ 75 mid diffuse  RCA 100% prox  Limited collalerals to distal RCA  Bilat renals ok shot for CRI/Poorly controlled HTN  Rec evaluation for possible CABG  Films sent to [facility]     CORONARY CIRCULATION: The coronary circulation is right dominant.  Proximal LAD:  There was a discrete 90 % stenosis. Mid LAD: There was  a 50 % stenosis. Mid circumflex: There was a diffuse 75 % stenosis at  the site of a prior stent. 1st obtuse marginal: There was a 50 %  stenosis. Proximal RCA: There was a diffuse 100 % stenosis. Right  posterolateral segment: The distal vessel was supplied by limited  collaterals from the LAD and the circumflex.     VENTRICLES: Analysis of regional contractile function demonstrated  moderate anterobasal hypokinesis, moderate anterolateral hypokinesis,  moderate apical hypokinesis, severe diaphragmatic hypokinesis, and  posterobasal akinesis. EF estimated was 35 %.     INDICATIONS: Angina/MI: unstable angina, CCS class III. Renal: HTN  poorly controlled with medication and renal insufficiency.     HISTORY: No history of previous myocardial infarction. There was no  prior diagnosis of congestive heart failure. The patient has  hypertension and a history of current cigarette use. There was no  history of cerebrovascular disease, peripheral arterial disease,  chronic lung disease, or diabetes. There was no family history of  coronary artery disease. PRIOR CARDIOVASCULAR PROCEDURES: No history  of valve surgery or coronary bypass surgery.     PRIOR DIAGNOSTIC TEST RESULTS: No prior stress test is available.     PROCEDURES PERFORMED: Left heart catheterization with  ventriculography. Left coronary angiography. Right coronary  angiography. Bilateral renal angiography. Procedure: Arterial  Occlusive Device     PROCEDURE: The risks and alternatives of the procedures and conscious  sedation were explained to the patient and informed consent was  obtained. The patient was brought to the cath lab and placed on the  table. The planned puncture sites were prepped and draped in the  usual sterile fashion.     -Right femoral artery access. The puncture site was infiltrated with  20 ml 1 % lidocaine. The vessel was accessed using the  modified  Seldinger technique, a wire was threaded into the vessel, and a 5 Fr  x 11cm Avanti sheath was advanced over the wire into the vessel.     -Left heart catheterization. A 5 Fr Angled pigtail catheter was  advanced to the ascending aorta. After recording ascending aortic  pressure, the catheter was advanced across the aorticvalve and left  ventricular pressure was recorded. Ventriculography was performed  using power injection of contrast agent ( 30 ml) at 10 cc/sec  at 600  PSI with a 0.4 sec rise time. Imaging was performed using an RAO  projection.     -Left coronary artery angiography. A catheter was advanced to the  aorta and positioned in the vessel ostium under fluoroscopic  guidance. Angiography was performed in multiple projections using  hand-injection of contrast.     -Right coronary artery angiography. A catheter was advanced to the  aorta and positioned in the vessel ostium under fluoroscopic  guidance. Angiography was performed in multiple projections using  hand-injection of contrast.     -Bilateral renal angiography. A catheter was positioned.     -Arterial Occlusive Device.     PROCEDURE COMPLETION: TIMING: Test started at 15:00. Test concluded at  15:27. RADIATION EXPOSURE: Fluoroscopy time: 2.02 min.  MEDICATIONS GIVEN: Fentanyl, 25 mcg, IV, at 15:01. Midazolam, 0.5 mg,  IV, at 15:01. Midazolam, 0.5 mg, IV, at 15:09. Fentanyl, 25 mcg, IV,  at 15:09.  CONTRAST GIVEN: Isovue 110 ml.     Prepared and signed by     Lujean Amel, MD  Signed 02/10/2011 15:46:16     STUDY DIAGRAM     Angiographic findings  Native coronary lesions:   Proximal LAD: Lesion 1: discrete, 90 % stenosis.   Mid LAD: Lesion 1: 50 % stenosis.  Mid circumflex: Lesion 1: diffuse, 75 % stenosis, site of prior  stent.   OM1: Lesion 1: 50 % stenosis.   Proximal RCA: Lesion 1: diffuse, 100 % stenosis.     HEMODYNAMIC TABLES     Pressures:  Baseline  Pressures:  - HR:  66  Pressures:  - Rhythm:  Pressures:  -- Aortic Pressure (S/D/M): 152/73/106  Pressures:  -- Left Ventricle (s/edp): 150/25/--     Outputs:  Baseline  Outputs:  -- CALCULATIONS[age]  Outputs:  -- CALCULATIONS: Body Surface Area: 2.27  Outputs:  -- CALCULATIONS: Height in cm: 180.00  Outputs:  -- CALCULATIONS: Sex: Female  Outputs:  -- CALCULATIONS: Weight in kg: 108.00  Cardiology:    15-Jan-13 09:15, ED ECG   Ventricular Rate 83   Atrial Rate 83   P-R Interval 168   QRS Duration 74   QT 362   QTc 425   P Axis 60   R Axis 18   T Axis 18   ECG interpretation    Normal sinus rhythm  Inferior infarct (cited on or before 05-Oct-2007)  Possible Anterior infarct (cited on or before 05-Oct-2007)  Abnormal ECG  When compared with ECG of 18-Aug-2010 05:56,  Previous ECG has undetermined rhythm, needs review  ----------unconfirmed----------  Confirmed by OVERREAD, NOT ([id number]), editor PEARSON, BARBARA ([id number]) on 02/09/2011 10:43:19 AM   ED ECG    Assessment/Plan:  Invasive Device Daily Assessment of Necessity:   Does the patient currently have any of the following indwelling devices? none   Assessment/Plan:   Assessment [place] Angina  CAD multivessel  HTN CRI Anemia Hyperlipidemia COPD Smoking Anxiety .    Plan PLAN continue tele ASA bp control Statin for lipids F/U renal insuff Advised to quit smoking Consider transfusion pre-CABG/PCI Anti-anxiety meds Transfer to [facility] for evaluation   Electronic Signatures: Lujean Amel D (MD)  (Signed 18-Jan-13 21:20)  Authored: Chief Complaint, VITAL SIGNS/ANCILLARY NOTES, Brief Assessment, Lab Results, Radiology Results, Assessment/Plan   Last Updated: 18-Jan-13 21:20 by Lujean Amel D (MD)

## 2014-05-19 LAB — CBC WITH DIFFERENTIAL/PLATELET
Basophil #: 0 10*3/uL (ref 0.0–0.1)
Basophil %: 0.6 %
Eosinophil #: 0.2 10*3/uL (ref 0.0–0.7)
Eosinophil %: 5.1 %
HCT: 37.3 % (ref 35.0–47.0)
HGB: 12.1 g/dL (ref 12.0–16.0)
LYMPHS ABS: 0.8 10*3/uL — AB (ref 1.0–3.6)
Lymphocyte %: 26.4 %
MCH: 31.9 pg (ref 26.0–34.0)
MCHC: 32.3 g/dL (ref 32.0–36.0)
MCV: 99 fL (ref 80–100)
MONO ABS: 0.4 x10 3/mm (ref 0.2–0.9)
Monocyte %: 12.4 %
NEUTROS PCT: 55.5 %
Neutrophil #: 1.8 10*3/uL (ref 1.4–6.5)
Platelet: 185 10*3/uL (ref 150–440)
RBC: 3.78 10*6/uL — AB (ref 3.80–5.20)
RDW: 13.7 % (ref 11.5–14.5)
WBC: 3.2 10*3/uL — ABNORMAL LOW (ref 3.6–11.0)

## 2014-05-19 LAB — BASIC METABOLIC PANEL
Anion Gap: 8 (ref 7–16)
BUN: 24 mg/dL — ABNORMAL HIGH
CHLORIDE: 104 mmol/L
CREATININE: 1.41 mg/dL — AB
Calcium, Total: 9 mg/dL
Co2: 29 mmol/L
EGFR (African American): 43 — ABNORMAL LOW
GFR CALC NON AF AMER: 37 — AB
Glucose: 97 mg/dL
POTASSIUM: 4.1 mmol/L
Sodium: 141 mmol/L

## 2014-05-25 NOTE — Consult Note (Signed)
PATIENT NAME:  Cynthia [name], Cynthia [name] MR#:  [id number] DATE OF BIRTH:  22-Feb-1940  DATE OF CONSULTATION:  05/18/2014  CONSULTING PHYSICIAN:  Scot Junobert T. Elliott, MD  HISTORY OF PRESENT ILLNESS: The patient is a 74 year old, black female with a history of coronary artery disease, previous bypass and stents. Today, the patient had onset at about 5:00 a.m. of left-sided chest pain, described as tightness and burning, 9/10 intensity, lasted 3 or 4 hours. She had dizziness and nausea. She came to the ER where she got some nitroglycerin and it improved her pain to 2/10. She was feeling better and the decision was made to admit her to the hospital. I was asked to see her in consultation for possible GI problems. The patient has been having intermittent right-sided abdominal pain for at least several years. She had a work-up in May of 2015 with Dr. Bluford Kaufmannh with a possible ERCP, but due to the anatomy, it was unable to be done. She did have an upper endoscopy that was grossly normal. She thinks he had a treadmill stress test done a year or 2 ago.   PAST MEDICAL HISTORY: Coronary artery disease with a stent, then a bypass and then a stent, history of dilated common bile duct 10 mm, history of hypertension, hyperlipidemia and GERD, history of a thyroid nodule.   REVIEW OF SYSTEMS: She says her stomach is fine until she moves about and "it comes when I try to do something like household chores." The pain has been present for a long time, off and on, maybe 10 years. Activity makes come on and walking a long way makes it hurt also. There is some nausea with these spells. There is no vomiting. She usually has a bowel movement every day. There is no blood in her bowel movements. Today she had chest pain. The chest pain radiated into her back, gave her nausea and some shortness of breath, but not severe shortness of breath. Yesterday, she vomited several times. She does have a history of acid reflux and takes medication for that. There is no  dysphagia. There is no coughing or blood in the sputum. She gets other spells of pain that comes with bending that lasts a few minutes. She gets these maybe once or twice a week.   PAST MEDICAL HISTORY: BTL, CABG, cardiac stents. She had an MI in 2004 and had a stent. Her last colonoscopy was by Dr. Niel HummerIftikhar and did have some polyps removed.   HABITS: A 1/2 pack a day smoker up until 2 years ago. No alcohol.   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 Dr. Juliann Paresallwood is her main heart doctor.  PHYSICAL EXAMINATION: GENERAL: Shows a somewhat obese, black female in no acute distress. Weight is 238 pounds.  VITAL SIGNS: Temperature 97.5, pulse 78, respirations 18, blood pressure 108/63, pulse oximetry 92% on room air.  HEENT: Sclerae anicteric. Conjunctivae negative. Tongue is negative.  CHEST: Clear.  HEART: No murmurs or gallops I can hear. No carotid bruits either.  ABDOMEN: Slight tenderness in the epigastric and right upper quadrant. No hepatosplenomegaly. No masses.  SKIN: Warm and dry.  PSYCHIATRIC: Mood and affect are appropriate.   LABORATORY DATA: Glucose 127, BUN 24, creatinine 1.36, sodium 134, potassium 3.6, chloride 99, CO2 of 26, indirect bilirubin 0.5, calcium 8.6, total protein 7.1, albumin 4, total bilirubin 0.6, direct bilirubin 0.1, alkaline phosphatase 69, SGOT 15, SGPT 11, CPK-MB 1.8.  Troponin less than 0.03. White count 6.8, hemoglobin 12.8, platelet count 186,000.   DIAGNOSTIC DATA: Chest x-ray shows stable mild cardiomegaly.   ASSESSMENT: The patient with a history of exertional abdominal and chest discomfort, who has had stents and a bypass before who had been a lifelong smoker, who had a negative endoscopic work-up in the past with Dr. Bluford Kaufmann. Endoscopic retrograde cholangiopancreatography was not able to be done. I think her pain may be more cardiac in nature at this time and I recommend that Dr. Juliann Pares see her  in consultation. I would give her a proton pump inhibitor at this time and would give her a very soft, bland diet as well.     ____________________________ Scot Jun, MD rte:TT D: 05/18/2014 15:45:41 ET T: 05/18/2014 16:05:46 ET JOB#: [id number]  cc: Scot Jun, MD, <Dictator> Scot Jun MD ELECTRONICALLY SIGNED 05/20/2014 7:56

## 2014-05-25 NOTE — H&P (Addendum)
PATIENT NAME:  Cynthia [name], Cynthia [name] MR#:  [id number] DATE OF BIRTH:  [birth year]  DATE OF ADMISSION:  05/18/2014  PRIMARY CARE PHYSICIAN: Adolph Pollack, MD   CARDIOLOGIST: Bobbie Stack. Callwood, MD    HISTORY OF PRESENT ILLNESS: The patient is a 74 year old African American female with past medical history significant for history of coronary artery disease status post coronary artery bypass grafting, stent after coronary artery disease bypass grafting, as well as before bypass, history of gastritis diagnosis in May 2015 by Dr. Bluford Kaufmann, when the patient came in with severe nausea, vomiting, and a severe right-sided abdominal pains. At that time, she was noted to have dilated biliary ducts, who presents back to the hospital with complaints of chest pains, as well as recurrent right-sided abdominal pains. Apparently, the patient was doing well up until today in the morning. At 5:00 a.m., she started having left-sided chest pain. The pain was described as tightness, burning, 9/10 by intensity, lasted approximately 3 or 4 hours, no significant change with deep breathing, walking around, or eating. The patient would become dizzy, as well as nauseated. She decided to come to the Emergency Room for further evaluation. In the Emergency Room, her vitals were stable. She was given nitroglycerin, which improved her pain to 2/10 by intensity. She seems to be a little bit better now. She also tells me that she has been having intermittent right-sided abdominal pains for a while now, since at least May 2015. She was supposed to be seeing Dr. Bluford Kaufmann, but she missed that appointment and was not able to go back, and apparently, she has been having right-sided abdominal pains intermittently with associated nausea, vomiting. Yesterday; however, she had a severe episode of nausea and vomiting, vomited all day long. She has been having abdominal right upper quadrant abdominal pain for the past 4 days, which she describes as intermittent pain,  increasing with any kind of activity and improving whenever she sits down. Hospitalist services were contacted for admission.   PAST MEDICAL HISTORY: Significant for history of coronary artery disease status post coronary artery bypass grafting. The patient apparently had stents before as well as after bypass grafting placement. History of gastritis, diagnosis by EGD in May 2015 by Dr. Bluford Kaufmann, dilated biliary ducts, history of coronary artery disease, hypertension, hyperlipidemia, gastroesophageal reflux disease, thyroid nodule, also a history of chronic right upper quadrant abdominal pain.   PAST SURGICAL HISTORY: Coronary artery bypass grafting, carotid endarterectomy as well as colonoscopy, as well as EGD in the past.   FAMILY HISTORY: No coronary artery disease or diabetes.   SOCIAL HISTORY: No smoking or alcohol abuse.   MEDICATIONS: Per medical records, the patient is on acetaminophen/oxycodone 325 mg/5 mg 1 tablet every 8 hours as needed, aspirin 81 mg p.o. daily, enalapril 2.5 mg twice daily, furosemide 40 mg p.o. daily as needed, hydralazine 25 mg 3 times daily, isosorbide mononitrate 30 mg daily, meloxicam 15 mg p.o. daily, metoprolol tartrate 25 mg p.o. twice daily, Nitrostat 0.4 mg sublingually every 5 minutes as needed, Zofran 4 mg 3 times daily as needed, oxycodone 10 mg 3 times daily as needed, Plavix 75 mg p.o. daily, Protonix 40 mg p.o. twice daily.   REVIEW OF SYSTEMS: Denies any fevers or chills, fatigue, weakness. Admits to have right-sided abdominal pains, which are going on for a long while, for a  year now. Denies any weight loss or gain. EYES: Denies any blurry vision, double vision, glaucoma, or cataracts.  EARS, NOSE, AND THROAT: Denies tinnitus,  allergies, epistaxis, sinus pain, dentures, or difficulty swallowing.  RESPIRATORY: Denies cough, wheeze, asthma, or COPD.  CARDIOVASCULAR: Admits to have left-sided chest pain. Denies orthopnea, arrhythmias, palpitation, or  syncope. GASTROINTESTINAL: Admits to nausea, vomiting yesterday, all day long, abdominal pain on the right side of the abdomen. No hematemesis, rectal bleeding, or change in bowel habits.  GENITOURINARY: Denies dysuria, hematuria, frequency, incontinence.  ENDOCRINOLOGY: Denies polydipsia, nocturia, thyroid problems, heat or cold intolerance, or thirst.  HEMATOLOGIC: Denies any anemia, easy bruising or bleeding, or swollen glands.  SKIN: Denies acne, rash, lesions, or change in moles.  MUSCULOSKELETAL: Denies arthritis, cramps, swelling.  NEUROLOGIC: Denies numbness, epilepsy, or tremors.  PSYCHIATRIC: Denies anxiety, insomnia, or depression.   PHYSICAL EXAMINATION: VITAL SIGNS: On arrival to the hospital: Temperature was 98.1, pulse was 88, respiration was 21, blood pressure 127/75, saturation was 93% on room air.  GENERAL: This is a well-developed, well-nourished, obese African American female in no significant distress, lying on the stretcher.  HEENT: Her pupils equal, reactive to light. Extraocular muscles intact. No icterus or conjunctivitis. Has normal hearing. No pharyngeal erythema. Mucosa is moist.  NECK: Supple, nontender. Thyroid not enlarged. No adenopathy. No JVD or carotid bruits bilaterally. Full range of motion.  LUNGS: Clear to auscultation in all fields. Somewhat diminished breath sounds, but otherwise no rales, rhonchi, or wheezing. No labored inspirations, increased effort. Not in overt respiratory distress.  CARDIOVASCULAR: S1, S2 appreciated. No murmurs, gallops or rubs. PMI not lateralized. Rhythm is regular. Chest is nontender to palpation. 1+ pedal pulses and no lower extremity edema, calf tenderness, or cyanosis was noted.  ABDOMEN: Soft, tender in epigastric area all the way down from the right upper quadrant, as well as right lower quadrant. No masses or hepatosplenomegaly were noted. The patient does have; however, some guarding during my palpation.  RECTAL: Deferred.   MUSCULOSKELETAL: Able to move all extremities. No cyanosis, degenerative joint disease, or kyphosis. Gait was not tested.  SKIN: Did not reveal any rashes, lesions, erythema, nodularity, or induration. It was warm and dry to palpation.  LYMPHATIC: No adenopathy in the cervical region.  NEUROLOGICAL: Cranial nerves grossly intact. Sensory is intact. No dysarthria or aphasia. The patient is alert, oriented to time, person, and place; cooperative. Memory is good.  PSYCHIATRIC: No significant confusion, agitation, or depression noted.   LABORATORIES: The patient's BMP showed BUN and creatinine of 24 and 1.36, glucose 127, sodium 134, otherwise BMP unremarkable. Liver enzymes not checked. Troponin less than 0.03. CBC: White blood cell count is 6.8, hemoglobin 12.8, platelet count 186,000.   RADIOLOGIC STUDIES: Chest x-ray, portable single view, 03/19/2014, showed stable cardiomegaly, mild cardiomegaly, no acute cardiopulmonary disease. EKG revealed normal sinus rhythm with sinus arrhythmia at 81 beats per minutes, normal axis, right bundle-branch block, nonspecific ST or T changes. In comparison to prior EKG done in November 2015, did not show any significant changes.   ASSESSMENT AND PLAN: 1. Chest pain. Admit the patient to the medical floor. Continue her outpatient medications, as well as aspirin and Plavix. We will also continue heparin subcutaneously. We will get Myoview stress test in the morning.  2. Right-sided abdominal pain of unclear etiology. We will get gastroenterology involved for further recommendations. The patient may benefit from repeating her CT scan of abdomen and pelvis. Continue the patient's PPIs, as well.  3. Hypertension. We will continue outpatient medications. Seems to be well controlled.  4. Hyperlipidemia. Continue outpatient medications. Check lipid panel in the morning.  5. Gastroesophageal reflux  disease. Continue PPIs.   TIME SPENT: 50 minutes.      ____________________________ Cynthia Caperima Marvina Danner, MD rv:mw D: 05/18/2014 12:02:14 ET T: 05/18/2014 13:45:30 ET JOB#: [id number]  cc: Cynthia Caperima Natoria Archibald, MD, <Dictator> Burley SaverL. Katherine Bliss, MD Dwayne D. Juliann Paresallwood, MD Iman Orourke MD ELECTRONICALLY SIGNED 05/24/2014 20:15

## 2014-05-25 NOTE — Discharge Summary (Signed)
PATIENT NAME:  Cynthia [name], SCHLAGEL MR#:  [id number] DATE OF BIRTH:  1940-04-17  DATE OF ADMISSION:  05/18/2014 DATE OF DISCHARGE:  05/19/2014  ADMITTING PHYSICIAN: Katharina Caper, MD  DISCHARGING PHYSICIAN: Enid Baas, MD    PRIMARY CARE PHYSICIAN: Burley Saver, MD  CONSULTATIONS IN THE HOSPITAL:  1.  Cardiology consultation by Dr. Juliann Pares.  2.  GI consultation by Dr. Mechele Collin.   DISCHARGE DIAGNOSES:  1.  Unstable angina with normal Myoview.  2.  Coronary artery disease, status post bypass surgery and percutaneous coronary intervention.  3.  Hypertension.  4.  Hyperlipidemia.   DISCHARGE HOME MEDICATIONS:  1.  Metoprolol  25 mg p.o. b.i.d.  2.  Sublingual nitroglycerin 0.4 mg p.r.n. for chest pain.  3.  Protonix 40 mg p.o. b.i.d.  4.  Plavix 75 mg p.o. daily.  5.  Hydralazine 25 mg p.o. 3 times a day. 6.  Aspirin 81 mg p.o. daily. 7.  Oxycodone 10 mg 3 times a day as needed for pain.  8.  Zofran 4 mg 3 times a day p.r.n. for nausea, vomiting. 9.  Percocet 5/325 mg 1 tablet q. 8 hours p.r.n. for pain.  10.  Lasix 40 mg p.o. daily. 11.  Enalapril 2.5 mg p.o. b.i.d.  12.  Meloxicam 15 mg daily. 13.  Imdur 30 mg p.o. daily.  DISCHARGE DIET: Low sodium diet.  DISCHARGE ACTIVITY: As tolerated.   FOLLOWUP INSTRUCTIONS:  1.  Follow up with Dr. Juliann Pares in 2 weeks.  2.  PCP follow up in 1-2 weeks.   LABORATORY AND IMAGING STUDIES PRIOR TO DISCHARGE:  1.  WBC 3.3, hemoglobin 12.1, hematocrit 37.3, platelet count of 185,000. 2.  Sodium 141, potassium 4.1, chloride 104, bicarbonate 29, BUN 24, creatinine 1.4, glucose 97, and calcium of 9.0. Troponins remain negative x 3 in the hospital.  3.  Chest x-ray on admission showing mild cardiomegaly, no acute cardiopulmonary disease.    4.  The patient did have a Lexiscan Myoview. The patient's Myoview was abnormal in the sense that moderate scar is present in the apical, inferior, inferoseptal and septal territory which seems chronic.  Her EF is noted to be 45%. Moderate global abnormality wall motion abnormality noted. No artifact noted in this study. There are no acute changes concerning for ischemia seen.   BRIEF HOSPITAL COURSE: Ms. Cynthia [name] is a 74 year old Caucasian female with a past medical history significant for coronary artery disease status post bypass graft surgery and prior PCI, gastroesophageal reflex disease, hypertension, chronic abdominal pain, presents to the hospital secondary to chest pain.  1.  Chest pain: Possible unstable angina, significant cardiac history. Was admitted, started on nitroglycerin, and all her cardiac medications were continued. She was also seen by GI who did not think her chest pain from her esophagitis, so seen by Dr. Juliann Pares. Had a stress test done, which was low risk scan. Minimum medication modifications were done. The patient was chest pain-free and so she was advised to follow with Dr. Juliann Pares as an outpatient. The patient is on Plavix. She is also on enalapril, Imdur, and metoprolol.  2.  Chronic pain: She is on Percocet. Also, on meloxicam.  3.  Hypertension: Metoprolol, hydralazine, Imdur and enalapril and also Lasix.   Her course has been otherwise uneventful in the hospital.   DISCHARGE CONDITION: Stable.   DISCHARGE DISPOSITION: Home.   TIME SPENT ON DISCHARGE: 45 minutes.   ____________________________ Enid Baas, MD rk:bm D: 05/22/2014 17:55:10 ET T: 05/23/2014 00:28:27 ET JOB#: [id number]  cc: Enid Baasadhika Kaci Freel, MD, <Dictator> Dwayne D. Callwood, MD L. Clayborn BignessKatherine Bliss, MD Enid BaasADHIKA Reagan Behlke MD ELECTRONICALLY SIGNED 05/23/2014 17:22

## 2014-06-15 ENCOUNTER — Emergency Department: Payer: Medicare HMO

## 2014-06-15 ENCOUNTER — Emergency Department
Admission: EM | Admit: 2014-06-15 | Discharge: 2014-06-15 | Disposition: A | Discharge status: Home / Self Care | Payer: Medicare HMO | Attending: Emergency Medicine | Admitting: Emergency Medicine

## 2014-06-15 ENCOUNTER — Encounter: Payer: Self-pay | Admitting: Emergency Medicine

## 2014-06-15 DIAGNOSIS — Z87891 Personal history of nicotine dependence: Secondary | ICD-10-CM | POA: Diagnosis not present

## 2014-06-15 DIAGNOSIS — E119 Type 2 diabetes mellitus without complications: Secondary | ICD-10-CM | POA: Insufficient documentation

## 2014-06-15 DIAGNOSIS — Z880 Allergy status to penicillin: Secondary | ICD-10-CM | POA: Insufficient documentation

## 2014-06-15 DIAGNOSIS — M25552 Pain in left hip: Secondary | ICD-10-CM

## 2014-06-15 DIAGNOSIS — S79912A Unspecified injury of left hip, initial encounter: Secondary | ICD-10-CM | POA: Insufficient documentation

## 2014-06-15 DIAGNOSIS — S40021A Contusion of right upper arm, initial encounter: Secondary | ICD-10-CM | POA: Insufficient documentation

## 2014-06-15 DIAGNOSIS — Y9289 Other specified places as the place of occurrence of the external cause: Secondary | ICD-10-CM | POA: Insufficient documentation

## 2014-06-15 DIAGNOSIS — W1839XA Other fall on same level, initial encounter: Secondary | ICD-10-CM | POA: Diagnosis not present

## 2014-06-15 DIAGNOSIS — Y998 Other external cause status: Secondary | ICD-10-CM | POA: Insufficient documentation

## 2014-06-15 DIAGNOSIS — S069X0A Unspecified intracranial injury without loss of consciousness, initial encounter: Secondary | ICD-10-CM | POA: Insufficient documentation

## 2014-06-15 DIAGNOSIS — Y9389 Activity, other specified: Secondary | ICD-10-CM | POA: Insufficient documentation

## 2014-06-15 DIAGNOSIS — S0990XA Unspecified injury of head, initial encounter: Secondary | ICD-10-CM | POA: Diagnosis present

## 2014-06-15 HISTORY — DX: Atherosclerotic heart disease of native coronary artery without angina pectoris: I25.10

## 2014-06-15 MED ORDER — ACETAMINOPHEN 500 MG PO TABS
ORAL_TABLET | ORAL | Status: AC
  Administered 2014-06-15: 1000 mg via ORAL
  Filled 2014-06-15: qty 2

## 2014-06-15 MED ORDER — ACETAMINOPHEN 500 MG PO TABS
1000.0000 mg | ORAL_TABLET | Freq: Once | ORAL | Status: AC
  Administered 2014-06-15: 1000 mg via ORAL

## 2014-06-15 NOTE — ED Notes (Signed)
Pt reports rolling out of bed 3 days ago; hitting head and shoulder on weights. Pt denies LOC, denies any vision problems. Pt reports pain to right side of head, pain to right shoulder and upper arm and pain to left hip. Pt alert and oriented in room, VSS, A&O.

## 2014-06-15 NOTE — ED Provider Notes (Signed)
[facility] Emergency Department Provider Note  ____________________________________________  Time seen: 10:15 AM  I have reviewed the triage vital signs and the nursing notes.   HISTORY  Chief Complaint Fall    HPI Cynthia [name] is a 74 y.o. female who rolled out of bed 3 days ago and fell onto the floor on her right side. She hit the right parietal area of her head on some free weights that were lying on the floor next to the bed. She did not have a loss of consciousness or neck pain. She complains of headache in the area, but no nausea, vision changes, dizziness or clouded cognition or concentration. She also complains of left hip pain after the fall, which is worse with standing and walking movements. That pain is nonradiating, sharp, moderately severe. No other alleviating or associated symptoms.     Past Medical History  Diagnosis Date  . Diabetes mellitus without complication   . Coronary artery disease     There are no active problems to display for this patient.   Past Surgical History  Procedure Laterality Date  . Cardiac surgery      No current outpatient prescriptions on file.  Allergies Penicillins  History reviewed. No pertinent family history.  Social History History  Substance Use Topics  . Smoking status: Former Smoker -- 0.50 packs/day for 35 years    Types: Cigarettes  . Smokeless tobacco: Never Used  . Alcohol Use: No    Review of Systems  Constitutional: No fever or chills. No weight changes Eyes:No blurry vision or double vision.  ENT: No sore throat. Cardiovascular: No chest pain. Respiratory: No dyspnea or cough. Gastrointestinal: Negative for abdominal pain, vomiting and diarrhea.  No BRBPR or melena. Genitourinary: Negative for dysuria, urinary retention, bloody urine, or difficulty urinating. Musculoskeletal: Left hip pain Skin: Negative for rash. Neurological: Headache. Psychiatric:No anxiety or  depression.   Endocrine:No hot/cold intolerance, changes in energy, or sleep difficulty.  10-point ROS otherwise negative.  ____________________________________________   PHYSICAL EXAM:  VITAL SIGNS: ED Triage Vitals  Enc Vitals Group     BP 06/15/14 0904 145/93 mmHg     Pulse Rate 06/15/14 0904 68     Resp 06/15/14 0904 16     Temp 06/15/14 0904 97.8 F (36.6 C)     Temp Source 06/15/14 0904 Oral     SpO2 06/15/14 0904 99 %     Weight 06/15/14 0904 249 lb (112.946 kg)     Height 06/15/14 0904 6' (1.829 m)     Head Cir --      Peak Flow --      Pain Score 06/15/14 0904 9     Pain Loc --      Pain Edu? --      Excl. in GC? --      Constitutional: Alert and oriented. Well appearing and in no distress. Eyes: No scleral icterus. No conjunctival pallor. PERRL. EOMI ENT   Head: Normocephalic without obvious trauma. Tenderness over the right parietal scalp without laceration   Nose: No congestion/rhinnorhea. No septal hematoma   Mouth/Throat: MMM, no pharyngeal erythema. No peritonsillar mass. No uvula shift.   Neck: No stridor. No SubQ emphysema. No meningismus. Hematological/Lymphatic/Immunilogical: No cervical lymphadenopathy. Cardiovascular: RRR. Normal and symmetric distal pulses are present in all extremities. No murmurs, rubs, or gallops. Respiratory: Normal respiratory effort without tachypnea nor retractions. Breath sounds are clear and equal bilaterally. No wheezes/rales/rhonchi. Gastrointestinal: Soft and nontender. No distention. There is  no CVA tenderness.  No rebound, rigidity, or guarding. Genitourinary: deferred Musculoskeletal: 2 areas of ecchymosis on the right upper arm lateral aspect. Full range of motion in the joints. No bony tenderness.  The right hip is unremarkable, nontender. Full range of motion. The left hip has full range of motion without pain on active passive movement, but the patient exhibits tenderness of the left hip in the area of  the greater trochanter on palpation   Neurologic:   Normal speech and language.  CN 2-10 normal. Motor grossly intact. No pronator drift.  Normal gait. No gross focal neurologic deficits are appreciated.  Skin:  Skin is warm, dry and intact. No rash noted.  No petechiae, purpura, or bullae. Psychiatric: Mood and affect are normal. Speech and behavior are normal. Patient exhibits appropriate insight and judgment.  ____________________________________________    LABS (pertinent positives/negatives) (all labs ordered are listed, but only abnormal results are displayed) Labs Reviewed - No data to display ____________________________________________   EKG    ____________________________________________    RADIOLOGY  CT head unremarkable. X-ray left hip unremarkable  ____________________________________________   PROCEDURES  ____________________________________________   INITIAL IMPRESSION / ASSESSMENT AND PLAN / ED COURSE  Pertinent labs & imaging results that were available during my care of the patient were reviewed by me and considered in my medical decision making (see chart for details).  The patient has a minor traumatic head injury and likely has some degree of musculoskeletal strain is causing her ongoing hip pain. She is otherwise well-appearing, no acute distress. Nontoxic, alert, awake, and I have a low suspicion of significant traumatic injury. We'll check a CT head due to her complaints and an x-ray of the left hip.  ____________________________________________   FINAL CLINICAL IMPRESSION(S) / ED DIAGNOSES  Final diagnoses:  Hip pain, acute, left  Traumatic brain injury, without loss of consciousness, initial encounter      Sharman Cheek, MD 06/15/14 1214

## 2014-06-15 NOTE — ED Notes (Signed)
Pt c/o pain after falling at home on Thursday 5/19.  She states that she hit the right side of her head, right arm, and left hip.  She did not lose consciousness.  No nausea, vomiting, dizziness, blurred vision.  No hearing changes.

## 2014-06-15 NOTE — Discharge Instructions (Signed)
Head Injury °You have received a head injury. It does not appear serious at this time. Headaches and vomiting are common following head injury. It should be easy to awaken from sleeping. Sometimes it is necessary for you to stay in the emergency department for a while for observation. Sometimes admission to the hospital may be needed. After injuries such as yours, most problems occur within the first 24 hours, but side effects may occur up to 7-10 days after the injury. It is important for you to carefully monitor your condition and contact your health care provider or seek immediate medical care if there is a change in your condition. °WHAT ARE THE TYPES OF HEAD INJURIES? °Head injuries can be as minor as a bump. Some head injuries can be more severe. More severe head injuries include: °· A jarring injury to the brain (concussion). °· A bruise of the brain (contusion). This mean there is bleeding in the brain that can cause swelling. °· A cracked skull (skull fracture). °· Bleeding in the brain that collects, clots, and forms a bump (hematoma). °WHAT CAUSES A HEAD INJURY? °A serious head injury is most likely to happen to someone who is in a car wreck and is not wearing a seat belt. Other causes of major head injuries include bicycle or motorcycle accidents, sports injuries, and falls. °HOW ARE HEAD INJURIES DIAGNOSED? °A complete history of the event leading to the injury and your current symptoms will be helpful in diagnosing head injuries. Many times, pictures of the brain, such as CT or MRI are needed to see the extent of the injury. Often, an overnight hospital stay is necessary for observation.  °WHEN SHOULD I SEEK IMMEDIATE MEDICAL CARE?  °You should get help right away if: °· You have confusion or drowsiness. °· You feel sick to your stomach (nauseous) or have continued, forceful vomiting. °· You have dizziness or unsteadiness that is getting worse. °· You have severe, continued headaches not relieved by  medicine. Only take over-the-counter or prescription medicines for pain, fever, or discomfort as directed by your health care provider. °· You do not have normal function of the arms or legs or are unable to walk. °· You notice changes in the black spots in the center of the colored part of your eye (pupil). °· You have a clear or bloody fluid coming from your nose or ears. °· You have a loss of vision. °During the next 24 hours after the injury, you must stay with someone who can watch you for the warning signs. This person should contact local emergency services (911 in the [place] if you have seizures, you become unconscious, or you are unable to wake up. °HOW CAN I PREVENT A HEAD INJURY IN THE FUTURE? °The most important factor for preventing major head injuries is avoiding motor vehicle accidents.  To minimize the potential for damage to your head, it is crucial to wear seat belts while riding in motor vehicles. Wearing helmets while bike riding and playing collision sports (like football) is also helpful. Also, avoiding dangerous activities around the house will further help reduce your risk of head injury.  °WHEN CAN I RETURN TO NORMAL ACTIVITIES AND ATHLETICS? °You should be reevaluated by your health care provider before returning to these activities. If you have any of the following symptoms, you should not return to activities or contact sports until 1 week after the symptoms have stopped: °· Persistent headache. °· Dizziness or vertigo. °· Poor attention and concentration. °· Confusion. °·   Memory problems.  Nausea or vomiting.  Fatigue or tire easily.  Irritability.  Intolerant of bright lights or loud noises.  Anxiety or depression.  Disturbed sleep. MAKE SURE YOU:   Understand these instructions.  Will watch your condition.  Will get help right away if you are not doing well or get worse. Document Released: 01/10/2005 Document Revised: 01/15/2013 Document Reviewed:  09/17/2012 [facility] Patient Information 2015 [place], [place]. This information is not intended to replace advice given to you by your health care provider. Make sure you discuss any questions you have with your health care provider.  Musculoskeletal Pain Musculoskeletal pain is muscle and boney aches and pains. These pains can occur in any part of the body. Your caregiver may treat you without knowing the cause of the pain. They may treat you if blood or urine tests, X-rays, and other tests were normal.  CAUSES There is often not a definite cause or reason for these pains. These pains may be caused by a type of germ (virus). The discomfort may also come from overuse. Overuse includes working out too hard when your body is not fit. Boney aches also come from weather changes. Bone is sensitive to atmospheric pressure changes. HOME CARE INSTRUCTIONS   Ask when your test results will be ready. Make sure you get your test results.  Only take over-the-counter or prescription medicines for pain, discomfort, or fever as directed by your caregiver. If you were given medications for your condition, do not drive, operate machinery or power tools, or sign legal documents for 24 hours. Do not drink alcohol. Do not take sleeping pills or other medications that may interfere with treatment.  Continue all activities unless the activities cause more pain. When the pain lessens, slowly resume normal activities. Gradually increase the intensity and duration of the activities or exercise.  During periods of severe pain, bed rest may be helpful. Lay or sit in any position that is comfortable.  Putting ice on the injured area.  Put ice in a bag.  Place a towel between your skin and the bag.  Leave the ice on for 15 to 20 minutes, 3 to 4 times a day.  Follow up with your caregiver for continued problems and no reason can be found for the pain. If the pain becomes worse or does not go away, it may be necessary to  repeat tests or do additional testing. Your caregiver may need to look further for a possible cause. SEEK IMMEDIATE MEDICAL CARE IF:  You have pain that is getting worse and is not relieved by medications.  You develop chest pain that is associated with shortness or breath, sweating, feeling sick to your stomach (nauseous), or throw up (vomit).  Your pain becomes localized to the abdomen.  You develop any new symptoms that seem different or that concern you. MAKE SURE YOU:   Understand these instructions.  Will watch your condition.  Will get help right away if you are not doing well or get worse. Document Released: 01/10/2005 Document Revised: 04/04/2011 Document Reviewed: 09/14/2012 [facility] Patient Information 2015 [place], [place]. This information is not intended to replace advice given to you by your health care provider. Make sure you discuss any questions you have with your health care provider.

## 2014-08-03 ENCOUNTER — Encounter: Payer: Self-pay | Admitting: Emergency Medicine

## 2014-08-03 ENCOUNTER — Other Ambulatory Visit: Payer: Self-pay

## 2014-08-03 ENCOUNTER — Emergency Department: Payer: Medicare HMO

## 2014-08-03 ENCOUNTER — Emergency Department
Admission: EM | Admit: 2014-08-03 | Discharge: 2014-08-03 | Disposition: A | Discharge status: Home / Self Care | Payer: Medicare HMO | Attending: Emergency Medicine | Admitting: Emergency Medicine

## 2014-08-03 DIAGNOSIS — Z880 Allergy status to penicillin: Secondary | ICD-10-CM | POA: Insufficient documentation

## 2014-08-03 DIAGNOSIS — R0789 Other chest pain: Secondary | ICD-10-CM | POA: Insufficient documentation

## 2014-08-03 DIAGNOSIS — Z87891 Personal history of nicotine dependence: Secondary | ICD-10-CM | POA: Insufficient documentation

## 2014-08-03 DIAGNOSIS — R109 Unspecified abdominal pain: Secondary | ICD-10-CM

## 2014-08-03 DIAGNOSIS — E119 Type 2 diabetes mellitus without complications: Secondary | ICD-10-CM | POA: Insufficient documentation

## 2014-08-03 DIAGNOSIS — I10 Essential (primary) hypertension: Secondary | ICD-10-CM | POA: Insufficient documentation

## 2014-08-03 DIAGNOSIS — R1011 Right upper quadrant pain: Secondary | ICD-10-CM

## 2014-08-03 DIAGNOSIS — R1013 Epigastric pain: Secondary | ICD-10-CM | POA: Diagnosis present

## 2014-08-03 LAB — COMPREHENSIVE METABOLIC PANEL
ALBUMIN: 4.2 g/dL (ref 3.5–5.0)
ALT: 11 U/L — AB (ref 14–54)
AST: 16 U/L (ref 15–41)
Alkaline Phosphatase: 95 U/L (ref 38–126)
Anion gap: 6 (ref 5–15)
BUN: 22 mg/dL — ABNORMAL HIGH (ref 6–20)
CALCIUM: 9.1 mg/dL (ref 8.9–10.3)
CO2: 24 mmol/L (ref 22–32)
Chloride: 107 mmol/L (ref 101–111)
Creatinine, Ser: 1.14 mg/dL — ABNORMAL HIGH (ref 0.44–1.00)
GFR calc Af Amer: 54 mL/min — ABNORMAL LOW (ref >60)
GFR, EST NON AFRICAN AMERICAN: 47 mL/min — AB (ref >60)
GLUCOSE: 101 mg/dL — AB (ref 65–99)
Potassium: 4.5 mmol/L (ref 3.5–5.1)
SODIUM: 137 mmol/L (ref 135–145)
Total Bilirubin: 0.8 mg/dL (ref 0.3–1.2)
Total Protein: 7.4 g/dL (ref 6.5–8.1)

## 2014-08-03 LAB — CBC WITH DIFFERENTIAL/PLATELET
BASOS PCT: 1 %
Basophils Absolute: 0 10*3/uL (ref 0–0.1)
EOS ABS: 0.1 10*3/uL (ref 0–0.7)
EOS PCT: 3 %
HCT: 40.6 % (ref 35.0–47.0)
HEMOGLOBIN: 13.4 g/dL (ref 12.0–16.0)
LYMPHS ABS: 1 10*3/uL (ref 1.0–3.6)
Lymphocytes Relative: 36 %
MCH: 32.6 pg (ref 26.0–34.0)
MCHC: 33.1 g/dL (ref 32.0–36.0)
MCV: 98.5 fL (ref 80.0–100.0)
MONO ABS: 0.4 10*3/uL (ref 0.2–0.9)
MONOS PCT: 13 %
Neutro Abs: 1.3 10*3/uL — ABNORMAL LOW (ref 1.4–6.5)
Neutrophils Relative %: 47 %
Platelets: 202 10*3/uL (ref 150–440)
RBC: 4.12 MIL/uL (ref 3.80–5.20)
RDW: 13 % (ref 11.5–14.5)
WBC: 2.7 10*3/uL — ABNORMAL LOW (ref 3.6–11.0)

## 2014-08-03 LAB — TROPONIN I: Troponin I: <0.03 ng/mL (ref <0.031)

## 2014-08-03 LAB — LIPASE, BLOOD: LIPASE: 30 U/L (ref 22–51)

## 2014-08-03 MED ORDER — ONDANSETRON HCL 4 MG/2ML IJ SOLN
4.0000 mg | Freq: Once | INTRAMUSCULAR | Status: AC
  Administered 2014-08-03: 4 mg via INTRAVENOUS

## 2014-08-03 MED ORDER — MORPHINE SULFATE 2 MG/ML IJ SOLN
2.0000 mg | Freq: Once | INTRAMUSCULAR | Status: AC
  Administered 2014-08-03: 2 mg via INTRAVENOUS

## 2014-08-03 MED ORDER — MORPHINE SULFATE 2 MG/ML IJ SOLN
INTRAMUSCULAR | Status: AC
  Filled 2014-08-03: qty 1

## 2014-08-03 MED ORDER — MORPHINE SULFATE 2 MG/ML IJ SOLN
INTRAMUSCULAR | Status: AC
  Administered 2014-08-03: 2 mg via INTRAVENOUS
  Filled 2014-08-03: qty 1

## 2014-08-03 MED ORDER — TRAMADOL HCL 50 MG PO TABS: 50.0000 mg | ORAL_TABLET | Freq: Four times a day (QID) | ORAL | Status: DC | PRN

## 2014-08-03 MED ORDER — ASPIRIN 81 MG PO CHEW
324.0000 mg | CHEWABLE_TABLET | Freq: Once | ORAL | Status: AC
  Administered 2014-08-03: 324 mg via ORAL

## 2014-08-03 MED ORDER — ONDANSETRON HCL 4 MG/2ML IJ SOLN
INTRAMUSCULAR | Status: AC
  Filled 2014-08-03: qty 2

## 2014-08-03 MED ORDER — ASPIRIN 81 MG PO CHEW
CHEWABLE_TABLET | ORAL | Status: AC
  Filled 2014-08-03: qty 4

## 2014-08-03 NOTE — ED Notes (Signed)
Pt states that she was getting ready for church this am and started to have pain at her epigastric area that radiates into her back, pt denies nausea or vomiting, reports hx of triple bypass, gerd, htn, and mi

## 2014-08-03 NOTE — Discharge Instructions (Signed)
Abdominal Pain Many things can cause abdominal pain. Usually, abdominal pain is not caused by a disease and will improve without treatment. It can often be observed and treated at home. Your health care provider will do a physical exam and possibly order blood tests and X-rays to help determine the seriousness of your pain. However, in many cases, more time must pass before a clear cause of the pain can be found. Before that point, your health care provider may not know if you need more testing or further treatment. HOME CARE INSTRUCTIONS  Monitor your abdominal pain for any changes. The following actions may help to alleviate any discomfort you are experiencing:  Only take over-the-counter or prescription medicines as directed by your health care provider.  Do not take laxatives unless directed to do so by your health care provider.  Try a clear liquid diet (broth, tea, or water) as directed by your health care provider. Slowly move to a bland diet as tolerated. SEEK MEDICAL CARE IF:  You have unexplained abdominal pain.  You have abdominal pain associated with nausea or diarrhea.  You have pain when you urinate or have a bowel movement.  You experience abdominal pain that wakes you in the night.  You have abdominal pain that is worsened or improved by eating food.  You have abdominal pain that is worsened with eating fatty foods.  You have a fever. SEEK IMMEDIATE MEDICAL CARE IF:   Your pain does not go away within 2 hours.  You keep throwing up (vomiting).  Your pain is felt only in portions of the abdomen, such as the right side or the left lower portion of the abdomen.  You pass bloody or black tarry stools. MAKE SURE YOU:  Understand these instructions.   Will watch your condition.   Will get help right away if you are not doing well or get worse.  Document Released: 10/20/2004 Document Revised: 01/15/2013 Document Reviewed: 09/19/2012 [facility]Exit[facility] Patient Information  2015 [place], [place]. This information is not intended to replace advice given to you by your health care provider. Make sure you discuss any questions you have with your health care provider.     As we have discussed [facility] Return to the emergency department for any acute abdominal discomfort, or any fever. Take your pain medication as needed, as prescribed

## 2014-08-03 NOTE — ED Provider Notes (Signed)
[facility]  EKG reviewed and interpreted by myself shows normal sinus rhythm at 75 bpm, narrow QRS, normal axis, normal intervals, nonspecific but no concerning ST changes noted.  ____________________________________________   INITIAL IMPRESSION / ASSESSMENT AND PLAN / ED COURSE  Pertinent labs & imaging results that were available during my care of the patient were reviewed by me and considered in my medical decision making (see chart for  details).  Patient with epigastric/right upper quadrant as well as chest pain this morning. He continues to have pain which she describes as moderate and aching. We will check labs, chest x-ray, right upper quadrant ultrasound, and continue to closely monitor. We will dose aspirin while awaiting for the results.  Labs are largely within normal limits. Ultrasound shows common bile duct dilation. I discussed this with the patient and they are aware of this, they've seen Dr. Bluford Kaufmannoh, who has referred them to do gastroenterology which they have not seen yet but are working on getting an appointment. Given her continued right flank pain, which is now completely abdomen denies any chest pain at all we will continue workup with a CT renal. I discussed with the patient who is agreeable.  CT largely within normal limits. Patient states the pain is now gone. [facility] We will discharge patient home with strict abdominal pain return precautions.  ____________________________________________   FINAL CLINICAL IMPRESSION(S) / ED DIAGNOSES  Epigastric pain Chest pain Normal pain   Minna AntisKevin Taksh Hjort, MD 08/03/14 1344

## 2014-08-03 NOTE — ED Notes (Signed)
Pt discharged home after verbalizing understanding of discharge instructions; nad noted. 

## 2014-09-08 ENCOUNTER — Other Ambulatory Visit: Payer: Self-pay

## 2014-09-08 ENCOUNTER — Emergency Department: Payer: Medicare HMO

## 2014-09-08 ENCOUNTER — Observation Stay
Admission: EM | Admit: 2014-09-08 | Discharge: 2014-09-09 | Disposition: A | Discharge status: Home / Self Care | Payer: Medicare HMO | Attending: Internal Medicine | Admitting: Internal Medicine

## 2014-09-08 ENCOUNTER — Encounter: Payer: Self-pay | Admitting: Emergency Medicine

## 2014-09-08 DIAGNOSIS — G43909 Migraine, unspecified, not intractable, without status migrainosus: Secondary | ICD-10-CM | POA: Diagnosis not present

## 2014-09-08 DIAGNOSIS — E785 Hyperlipidemia, unspecified: Secondary | ICD-10-CM | POA: Insufficient documentation

## 2014-09-08 DIAGNOSIS — R072 Precordial pain: Secondary | ICD-10-CM | POA: Insufficient documentation

## 2014-09-08 DIAGNOSIS — G8929 Other chronic pain: Secondary | ICD-10-CM | POA: Insufficient documentation

## 2014-09-08 DIAGNOSIS — I739 Peripheral vascular disease, unspecified: Secondary | ICD-10-CM | POA: Insufficient documentation

## 2014-09-08 DIAGNOSIS — E669 Obesity, unspecified: Secondary | ICD-10-CM | POA: Insufficient documentation

## 2014-09-08 DIAGNOSIS — M549 Dorsalgia, unspecified: Secondary | ICD-10-CM | POA: Insufficient documentation

## 2014-09-08 DIAGNOSIS — Z880 Allergy status to penicillin: Secondary | ICD-10-CM | POA: Diagnosis not present

## 2014-09-08 DIAGNOSIS — I517 Cardiomegaly: Secondary | ICD-10-CM | POA: Insufficient documentation

## 2014-09-08 DIAGNOSIS — I129 Hypertensive chronic kidney disease with stage 1 through stage 4 chronic kidney disease, or unspecified chronic kidney disease: Secondary | ICD-10-CM | POA: Insufficient documentation

## 2014-09-08 DIAGNOSIS — K219 Gastro-esophageal reflux disease without esophagitis: Secondary | ICD-10-CM | POA: Insufficient documentation

## 2014-09-08 DIAGNOSIS — Z7902 Long term (current) use of antithrombotics/antiplatelets: Secondary | ICD-10-CM | POA: Insufficient documentation

## 2014-09-08 DIAGNOSIS — E860 Dehydration: Secondary | ICD-10-CM | POA: Insufficient documentation

## 2014-09-08 DIAGNOSIS — Z79899 Other long term (current) drug therapy: Secondary | ICD-10-CM | POA: Insufficient documentation

## 2014-09-08 DIAGNOSIS — R001 Bradycardia, unspecified: Secondary | ICD-10-CM | POA: Insufficient documentation

## 2014-09-08 DIAGNOSIS — Z6832 Body mass index (BMI) 32.0-32.9, adult: Secondary | ICD-10-CM | POA: Diagnosis not present

## 2014-09-08 DIAGNOSIS — I25110 Atherosclerotic heart disease of native coronary artery with unstable angina pectoris: Secondary | ICD-10-CM | POA: Insufficient documentation

## 2014-09-08 DIAGNOSIS — R079 Chest pain, unspecified: Secondary | ICD-10-CM | POA: Diagnosis not present

## 2014-09-08 DIAGNOSIS — Z87891 Personal history of nicotine dependence: Secondary | ICD-10-CM | POA: Insufficient documentation

## 2014-09-08 DIAGNOSIS — N183 Chronic kidney disease, stage 3 (moderate): Secondary | ICD-10-CM | POA: Insufficient documentation

## 2014-09-08 DIAGNOSIS — Z7982 Long term (current) use of aspirin: Secondary | ICD-10-CM | POA: Diagnosis not present

## 2014-09-08 DIAGNOSIS — Z951 Presence of aortocoronary bypass graft: Secondary | ICD-10-CM | POA: Diagnosis not present

## 2014-09-08 DIAGNOSIS — J449 Chronic obstructive pulmonary disease, unspecified: Secondary | ICD-10-CM | POA: Insufficient documentation

## 2014-09-08 HISTORY — DX: Hyperlipidemia, unspecified: E78.5

## 2014-09-08 HISTORY — DX: Essential (primary) hypertension: I10

## 2014-09-08 HISTORY — DX: Presence of aortocoronary bypass graft: Z95.1

## 2014-09-08 HISTORY — DX: Gastro-esophageal reflux disease without esophagitis: K21.9

## 2014-09-08 LAB — COMPREHENSIVE METABOLIC PANEL
ALK PHOS: 73 U/L (ref 38–126)
ALT: 11 U/L — AB (ref 14–54)
AST: 17 U/L (ref 15–41)
Albumin: 3.8 g/dL (ref 3.5–5.0)
Anion gap: 9 (ref 5–15)
BILIRUBIN TOTAL: 1.1 mg/dL (ref 0.3–1.2)
BUN: 29 mg/dL — AB (ref 6–20)
CALCIUM: 8.6 mg/dL — AB (ref 8.9–10.3)
CO2: 21 mmol/L — ABNORMAL LOW (ref 22–32)
CREATININE: 1.23 mg/dL — AB (ref 0.44–1.00)
Chloride: 108 mmol/L (ref 101–111)
GFR, EST AFRICAN AMERICAN: 49 mL/min — AB (ref >60)
GFR, EST NON AFRICAN AMERICAN: 42 mL/min — AB (ref >60)
Glucose, Bld: 91 mg/dL (ref 65–99)
Potassium: 4.4 mmol/L (ref 3.5–5.1)
Sodium: 138 mmol/L (ref 135–145)
TOTAL PROTEIN: 6.8 g/dL (ref 6.5–8.1)

## 2014-09-08 LAB — MAGNESIUM: Magnesium: 2 mg/dL (ref 1.7–2.4)

## 2014-09-08 LAB — CBC
HCT: 42.4 % (ref 35.0–47.0)
Hemoglobin: 14 g/dL (ref 12.0–16.0)
MCH: 32.2 pg (ref 26.0–34.0)
MCHC: 33 g/dL (ref 32.0–36.0)
MCV: 97.5 fL (ref 80.0–100.0)
Platelets: 208 10*3/uL (ref 150–440)
RBC: 4.35 MIL/uL (ref 3.80–5.20)
RDW: 13.3 % (ref 11.5–14.5)
WBC: 4.2 10*3/uL (ref 3.6–11.0)

## 2014-09-08 LAB — TROPONIN I: Troponin I: <0.03 ng/mL (ref <0.031)

## 2014-09-08 MED ORDER — HYDRALAZINE HCL 25 MG PO TABS
25.0000 mg | ORAL_TABLET | Freq: Three times a day (TID) | ORAL | Status: DC
  Administered 2014-09-08 – 2014-09-09 (×4): 25 mg via ORAL
  Filled 2014-09-08 (×4): qty 1

## 2014-09-08 MED ORDER — ONDANSETRON HCL 4 MG/2ML IJ SOLN: 4.0000 mg | Freq: Four times a day (QID) | INTRAMUSCULAR | Status: DC | PRN

## 2014-09-08 MED ORDER — MORPHINE SULFATE (PF) 2 MG/ML IV SOLN
2.0000 mg | Freq: Once | INTRAVENOUS | Status: AC
  Administered 2014-09-08: 2 mg via INTRAVENOUS
  Filled 2014-09-08: qty 1

## 2014-09-08 MED ORDER — ALBUTEROL SULFATE (2.5 MG/3ML) 0.083% IN NEBU: 2.5000 mg | INHALATION_SOLUTION | RESPIRATORY_TRACT | Status: DC | PRN

## 2014-09-08 MED ORDER — SODIUM CHLORIDE 0.9 % IV SOLN: 250.0000 mL | INTRAVENOUS | Status: DC | PRN

## 2014-09-08 MED ORDER — HEPARIN SODIUM (PORCINE) 5000 UNIT/ML IJ SOLN
5000.0000 [IU] | Freq: Three times a day (TID) | INTRAMUSCULAR | Status: DC
  Administered 2014-09-08 – 2014-09-09 (×4): 5000 [IU] via SUBCUTANEOUS
  Filled 2014-09-08 (×4): qty 1

## 2014-09-08 MED ORDER — PANTOPRAZOLE SODIUM 40 MG PO TBEC
40.0000 mg | DELAYED_RELEASE_TABLET | Freq: Two times a day (BID) | ORAL | Status: DC
  Administered 2014-09-08 – 2014-09-09 (×2): 40 mg via ORAL
  Filled 2014-09-08 (×2): qty 1

## 2014-09-08 MED ORDER — ACETAMINOPHEN 325 MG PO TABS
650.0000 mg | ORAL_TABLET | Freq: Four times a day (QID) | ORAL | Status: DC | PRN
  Administered 2014-09-09 (×2): 650 mg via ORAL
  Filled 2014-09-08 (×2): qty 2

## 2014-09-08 MED ORDER — SODIUM CHLORIDE 0.9 % IV SOLN
INTRAVENOUS | Status: DC
  Administered 2014-09-09: 06:00:00 via INTRAVENOUS

## 2014-09-08 MED ORDER — ONDANSETRON HCL 4 MG PO TABS
4.0000 mg | ORAL_TABLET | Freq: Four times a day (QID) | ORAL | Status: DC | PRN
Start: 2014-09-08 — End: 2014-09-09

## 2014-09-08 MED ORDER — SODIUM CHLORIDE 0.9 % IJ SOLN: 3.0000 mL | INTRAMUSCULAR | Status: DC | PRN

## 2014-09-08 MED ORDER — ASPIRIN EC 81 MG PO TBEC
81.0000 mg | DELAYED_RELEASE_TABLET | Freq: Every day | ORAL | Status: DC
  Administered 2014-09-09: 81 mg via ORAL
  Filled 2014-09-08: qty 1

## 2014-09-08 MED ORDER — HYDROMORPHONE HCL 2 MG PO TABS
2.0000 mg | ORAL_TABLET | Freq: Three times a day (TID) | ORAL | Status: DC | PRN
  Administered 2014-09-09: 2 mg via ORAL
  Filled 2014-09-08: qty 1

## 2014-09-08 MED ORDER — ISOSORBIDE MONONITRATE ER 30 MG PO TB24
30.0000 mg | ORAL_TABLET | Freq: Every day | ORAL | Status: DC
  Administered 2014-09-09: 30 mg via ORAL
  Filled 2014-09-08: qty 1

## 2014-09-08 MED ORDER — SODIUM CHLORIDE 0.9 % IJ SOLN
3.0000 mL | Freq: Two times a day (BID) | INTRAMUSCULAR | Status: DC
  Administered 2014-09-08 – 2014-09-09 (×2): 3 mL via INTRAVENOUS

## 2014-09-08 MED ORDER — LATANOPROST 0.005 % OP SOLN
1.0000 [drp] | Freq: Every day | OPHTHALMIC | Status: DC
  Administered 2014-09-08: 1 [drp] via OPHTHALMIC
  Filled 2014-09-08: qty 2.5

## 2014-09-08 MED ORDER — NITROGLYCERIN 0.4 MG SL SUBL
0.4000 mg | SUBLINGUAL_TABLET | Freq: Once | SUBLINGUAL | Status: AC
  Administered 2014-09-08: 0.4 mg via SUBLINGUAL

## 2014-09-08 MED ORDER — ACETAMINOPHEN 650 MG RE SUPP: 650.0000 mg | Freq: Four times a day (QID) | RECTAL | Status: DC | PRN

## 2014-09-08 MED ORDER — CLOPIDOGREL BISULFATE 75 MG PO TABS
75.0000 mg | ORAL_TABLET | Freq: Every day | ORAL | Status: DC
  Administered 2014-09-09: 75 mg via ORAL
  Filled 2014-09-08: qty 1

## 2014-09-08 MED ORDER — ENALAPRIL MALEATE 5 MG PO TABS
2.5000 mg | ORAL_TABLET | Freq: Two times a day (BID) | ORAL | Status: DC
  Administered 2014-09-08 – 2014-09-09 (×2): 2.5 mg via ORAL
  Filled 2014-09-08 (×2): qty 1

## 2014-09-08 MED ORDER — SODIUM CHLORIDE 0.9 % IJ SOLN
3.0000 mL | Freq: Two times a day (BID) | INTRAMUSCULAR | Status: DC
  Administered 2014-09-08: 3 mL via INTRAVENOUS

## 2014-09-08 NOTE — ED Provider Notes (Signed)
[facility] Emergency Department Provider Note  ____________________________________________  Time seen: On arrival  I have reviewed the triage vital signs and the nursing notes.   HISTORY  Chief Complaint Chest Pain    HPI Cynthia [name] is a 74 y.o. female who presents with complaints of chest pain. She reports the pain started approximately 11 AM.It is mild to moderate and pressure-like in intensity. It improved after nitroglycerin and she has a history of coronary artery disease and had a bypass in 2013 at [facility]. She denies shortness of breath. No fevers no chills.     Past Medical History  Diagnosis Date  . Coronary artery disease   . Hx of CABG 2013  . Hypertension   . Hyperlipidemia   . GERD (gastroesophageal reflux disease)     There are no active problems to display for this patient.   Past Surgical History  Procedure Laterality Date  . Cardiac surgery      Current Outpatient Rx  Name  Route  Sig  Dispense  Refill  . aspirin EC 81 MG tablet   Oral   Take 81 mg by mouth daily.         . clopidogrel (PLAVIX) 75 MG tablet   Oral   Take 75 mg by mouth daily.      11   . enalapril (VASOTEC) 2.5 MG tablet   Oral   Take 2.5 mg by mouth 2 (two) times daily.      3   . furosemide (LASIX) 40 MG tablet   Oral   Take 40 mg by mouth daily as needed. In the morning for swelling      3   . hydrALAZINE (APRESOLINE) 25 MG tablet   Oral   Take 25 mg by mouth 3 (three) times daily.      3   . HYDROmorphone (DILAUDID) 2 MG tablet   Oral   Take 2 mg by mouth 3 (three) times daily as needed.      0   . isosorbide mononitrate (IMDUR) 30 MG 24 hr tablet   Oral   Take 30 mg by mouth daily.      3   . latanoprost (XALATAN) 0.005 % ophthalmic solution   Both Eyes   Place 1 drop into both eyes at bedtime.      5   . meloxicam (MOBIC) 15 MG tablet   Oral   Take 15 mg by mouth daily.      1   . ondansetron (ZOFRAN) 4  MG tablet   Oral   Take 4 mg by mouth 3 (three) times daily as needed. For nausea.      5   . oxyCODONE-acetaminophen (PERCOCET) 10-325 MG per tablet   Oral   Take 1 tablet by mouth 2 (two) times daily as needed. for pain      0   . pantoprazole (PROTONIX) 40 MG tablet   Oral   Take 20 mg by mouth 2 (two) times daily.         . traMADol (ULTRAM) 50 MG tablet   Oral   Take 1 tablet (50 mg total) by mouth every 6 (six) hours as needed.   20 tablet   0     Allergies Penicillins  No family history on file.  Social History Social History  Substance Use Topics  . Smoking status: Former Smoker -- 0.50 packs/day for 35 years    Types: Cigarettes  . Smokeless tobacco:  Never Used  . Alcohol Use: No    Review of Systems  Constitutional: Negative for fever. Eyes: Negative for visual changes. ENT: Negative for sore throat Cardiovascular: Positive for chest pain. Respiratory: Negative for shortness of breath. Gastrointestinal: Negative for abdominal pain, vomiting and diarrhea. Genitourinary: Negative for dysuria. Musculoskeletal: Negative for back pain. Skin: Negative for rash. Neurological: Negative for headaches or focal weakness Psychiatric mild anxiety    ____________________________________________   PHYSICAL EXAM:  VITAL SIGNS: ED Triage Vitals  Enc Vitals Group     BP 09/08/14 1230 97/67 mmHg     Pulse Rate 09/08/14 1203 72     Resp 09/08/14 1203 20     Temp 09/08/14 1203 97.6 F (36.4 C)     Temp Source 09/08/14 1203 Oral     SpO2 09/08/14 1203 95 %     Weight 09/08/14 1203 240 lb (108.863 kg)     Height 09/08/14 1203 6' (1.829 m)     Head Cir --      Peak Flow --      Pain Score 09/08/14 1207 8     Pain Loc --      Pain Edu? --      Excl. in GC? --      Constitutional: Alert and oriented. Well appearing and in no distress. Eyes: Conjunctivae are normal.  ENT   Head: Normocephalic and atraumatic.   Mouth/Throat: Mucous membranes  are moist. Cardiovascular: Normal rate, regular rhythm. Normal and symmetric distal pulses are present in all extremities. No murmurs, rubs, or gallops. Respiratory: Normal respiratory effort without tachypnea nor retractions. Breath sounds are clear and equal bilaterally.  Gastrointestinal: Soft and non-tender in all quadrants. No distention. There is no CVA tenderness. Genitourinary: deferred Musculoskeletal: Nontender with normal range of motion in all extremities. No lower extremity tenderness nor edema. Neurologic:  Normal speech and language. No gross focal neurologic deficits are appreciated. Skin:  Skin is warm, dry and intact. No rash noted. Psychiatric: Mood and affect are normal. Patient exhibits appropriate insight and judgment.  ____________________________________________    LABS (pertinent positives/negatives)  Labs Reviewed  COMPREHENSIVE METABOLIC PANEL - Abnormal; Notable for the following:    CO2 21 (*)    BUN 29 (*)    Creatinine, Ser 1.23 (*)    Calcium 8.6 (*)    ALT 11 (*)    GFR calc non Af Amer 42 (*)    GFR calc Af Amer 49 (*)    All other components within normal limits  CBC  TROPONIN I    ____________________________________________   EKG  ED ECG REPORT I, Jene Every, the attending physician, personally viewed and interpreted this ECG.   Date: 09/08/2014  EKG Time: 12:28 PM  Rate: 71  Rhythm: normal sinus rhythm  Axis: Normal  Intervals:right bundle branch block  ST&T Change: Nonspecific  Unchanged from prior EKG ____________________________________________    RADIOLOGY I have personally reviewed any xrays that were ordered on this patient: Chest x-ray is unremarkable  ____________________________________________   PROCEDURES  Procedure(s) performed: none  Critical Care performed: none  ____________________________________________   INITIAL IMPRESSION / ASSESSMENT AND PLAN / ED COURSE  Pertinent labs & imaging results  that were available during my care of the patient were reviewed by me and considered in my medical decision making (see chart for details).  Patient overall well-appearing. Her initial labs are unremarkable. However she has a strong history of coronary artery disease and describes pressure-like substernal chest pain which only  briefly resolves with magic glycerin. We will give her IV morphine and admitted her to the hospital for further management    ____________________________________________   FINAL CLINICAL IMPRESSION(S) / ED DIAGNOSES  Final diagnoses:  Chest pain, unspecified chest pain type     Jene Every, MD 09/08/14 1541

## 2014-09-08 NOTE — ED Notes (Signed)
Pt comes in via EMS c/o chest pain.  Patient took 4 baby aspirin at home and one nitro given in field.  122/82, 75 regular, CBG 90.  Central chest pain with no radiation with sudden onset.  Patient from home.  CABG in 2013.  Pain injection given in the hip last week.  Denies shortness of breath, dizziness, N/V.

## 2014-09-08 NOTE — H&P (Signed)
[facility] -  at [facility]   PATIENT NAME: Cynthia [name]    MR#:  [id number]  DATE OF BIRTH:  Aug 15, 1940  DATE OF ADMISSION:  09/08/2014  PRIMARY CARE PHYSICIAN: No primary care provider on file.   REQUESTING/REFERRING PHYSICIAN: Jene Every, MD  CHIEF COMPLAINT:   Chief Complaint  Patient presents with  . Chest Pain   chest pain since 11 AM today.  HISTORY OF PRESENT ILLNESS:  Shemeca Lysne  is a 74 y.o. female with a known history of hypertension, CAD and hyperlipidemia. The patient is started to have chest pain since 11 AM, which is in the substernal area, pressure-like without radiation. She feels better after given aspirin and nitroglycerin, but is still has chest pressure. She denies any other symptoms.  PAST MEDICAL HISTORY:   Past Medical History  Diagnosis Date  . Coronary artery disease   . Hx of CABG 2013  . Hypertension   . Hyperlipidemia   . GERD (gastroesophageal reflux disease)     PAST SURGICAL HISTORY:   Past Surgical History  Procedure Laterality Date  . Cardiac surgery      SOCIAL HISTORY:   Social History  Substance Use Topics  . Smoking status: Former Smoker -- 0.50 packs/day for 35 years    Types: Cigarettes  . Smokeless tobacco: Never Used  . Alcohol Use: No    FAMILY HISTORY:  No family history on file. no family history of cardiovascular disease, hypertension or diabetes.  DRUG ALLERGIES:   Allergies  Allergen Reactions  . Penicillins Swelling    Tongue turns white and swells up    REVIEW OF SYSTEMS:  CONSTITUTIONAL: No fever, fatigue or weakness.  EYES: No blurred or double vision.  EARS, NOSE, AND THROAT: No tinnitus or ear pain.  RESPIRATORY: No cough, shortness of breath, wheezing or hemoptysis.  CARDIOVASCULAR: Has chest pain, no orthopnea or edema.  GASTROINTESTINAL: No nausea, vomiting, diarrhea or abdominal pain.  GENITOURINARY: No dysuria, hematuria.  ENDOCRINE: No polyuria, nocturia,   HEMATOLOGY: No anemia, easy bruising or bleeding SKIN: No rash or lesion. MUSCULOSKELETAL: No joint pain or arthritis.   NEUROLOGIC: No tingling, numbness, weakness.  PSYCHIATRY: No anxiety or depression.   MEDICATIONS AT HOME:   Prior to Admission medications   Medication Sig Start Date End Date Taking? Authorizing Provider  aspirin EC 81 MG tablet Take 81 mg by mouth daily.    Historical Provider, MD  clopidogrel (PLAVIX) 75 MG tablet Take 75 mg by mouth daily. 07/05/14   Historical Provider, MD  enalapril (VASOTEC) 2.5 MG tablet Take 2.5 mg by mouth 2 (two) times daily. 06/28/14   Historical Provider, MD  furosemide (LASIX) 40 MG tablet Take 40 mg by mouth daily as needed. In the morning for swelling 06/28/14   Historical Provider, MD  hydrALAZINE (APRESOLINE) 25 MG tablet Take 25 mg by mouth 3 (three) times daily. 06/28/14   Historical Provider, MD  HYDROmorphone (DILAUDID) 2 MG tablet Take 2 mg by mouth 3 (three) times daily as needed. 07/14/14   Historical Provider, MD  isosorbide mononitrate (IMDUR) 30 MG 24 hr tablet Take 30 mg by mouth daily. 06/28/14   Historical Provider, MD  latanoprost (XALATAN) 0.005 % ophthalmic solution Place 1 drop into both eyes at bedtime. 06/07/14   Historical Provider, MD  meloxicam (MOBIC) 15 MG tablet Take 15 mg by mouth daily. 07/05/14   Historical Provider, MD  ondansetron (ZOFRAN) 4 MG tablet Take 4 mg by mouth 3 (three)  times daily as needed. For nausea. 07/05/14   Historical Provider, MD  oxyCODONE-acetaminophen (PERCOCET) 10-325 MG per tablet Take 1 tablet by mouth 2 (two) times daily as needed. for pain 07/31/14   Historical Provider, MD  pantoprazole (PROTONIX) 40 MG tablet Take 20 mg by mouth 2 (two) times daily.    Historical Provider, MD  traMADol (ULTRAM) 50 MG tablet Take 1 tablet (50 mg total) by mouth every 6 (six) hours as needed. 08/03/14 08/03/15  Minna Antis, MD      VITAL SIGNS:  Blood pressure 111/58, pulse 62, temperature 98.7 F (37.1  C), temperature source Oral, resp. rate 16, height 6' (1.829 m), weight 108.863 kg (240 lb), SpO2 95 %.  PHYSICAL EXAMINATION:  GENERAL:  74 y.o.-year-old patient lying in the bed with no acute distress. Obesity. EYES: Pupils equal, round, reactive to light and accommodation. No scleral icterus. Extraocular muscles intact.  HEENT: Head atraumatic, normocephalic. Oropharynx and nasopharynx clear. Moist oral mucosa. NECK:  Supple, no jugular venous distention. No thyroid enlargement, no tenderness.  LUNGS: Normal breath sounds bilaterally, no wheezing, rales,rhonchi or crepitation. No use of accessory muscles of respiration.  CARDIOVASCULAR: S1, S2 normal. No murmurs, rubs, or gallops.  ABDOMEN: Soft, nontender, nondistended. Bowel sounds present. No organomegaly or mass.  EXTREMITIES: No pedal edema, cyanosis, or clubbing.  NEUROLOGIC: Cranial nerves II through XII are intact. Muscle strength 5/5 in all extremities. Sensation intact. Gait not checked.  PSYCHIATRIC: The patient is alert and oriented x 3.  SKIN: No obvious rash, lesion, or ulcer.   LABORATORY PANEL:   CBC  Recent Labs Lab 09/08/14 1211  WBC 4.2  HGB 14.0  HCT 42.4  PLT 208   ------------------------------------------------------------------------------------------------------------------  Chemistries   Recent Labs Lab 09/08/14 1211  NA 138  K 4.4  CL 108  CO2 21*  GLUCOSE 91  BUN 29*  CREATININE 1.23*  CALCIUM 8.6*  AST 17  ALT 11*  ALKPHOS 73  BILITOT 1.1   ------------------------------------------------------------------------------------------------------------------  Cardiac Enzymes  Recent Labs Lab 09/08/14 1211  TROPONINI <0.03   ------------------------------------------------------------------------------------------------------------------  RADIOLOGY:  Dg Chest Portable 1 View  09/08/2014   CLINICAL DATA:  Midsternal chest pain today.  EXAM: PORTABLE CHEST - 1 VIEW  COMPARISON:   Single view of the chest 08/03/2014 and 05/18/2014.  FINDINGS: The patient is status post CABG. There is cardiomegaly without edema. The lungs are clear. No pneumothorax or pleural effusion.  IMPRESSION: Cardiomegaly without acute disease.   Electronically Signed   By: Drusilla Kanner M.D.   On: 09/08/2014 12:42    EKG:   Orders placed or performed during the hospital encounter of 09/08/14  . ED EKG  . ED EKG    IMPRESSION AND PLAN:   Chest pain, need to rule out ACS Dehydration CAD Hypertension Hyperlipidemia  The patient will be placed for observation. I will continue telemetry monitor, continue aspirin, Plavix and statin. Nitroglycerin when necessary. Follow-up troponin level. Cardiology consult. I will start gentle rehydration with normal saline and hold Lasix due to dehydration. Hold Mobic. Continue hypertension medication except Lasix.  All the records are reviewed and case discussed with ED provider. Management plans discussed with the patient, her husband and they are in agreement.  CODE STATUS: Full code  TOTAL TIME TAKING CARE OF THIS PATIENT: 46 minutes.    Shaune Pollack M.D on 09/08/2014 at 3:56 PM  Between 7am to 6pm - Pager - [phone]  After 6pm go to [email or web address] - password [email or web address] [facility]  [facility]  Office  [phone]  CC: Primary care physician; No primary care provider on file.

## 2014-09-09 ENCOUNTER — Observation Stay: Payer: Medicare HMO

## 2014-09-09 ENCOUNTER — Encounter: Payer: Self-pay | Admitting: Radiology

## 2014-09-09 LAB — BASIC METABOLIC PANEL
ANION GAP: 6 (ref 5–15)
BUN: 29 mg/dL — AB (ref 6–20)
CHLORIDE: 106 mmol/L (ref 101–111)
CO2: 24 mmol/L (ref 22–32)
Calcium: 8.7 mg/dL — ABNORMAL LOW (ref 8.9–10.3)
Creatinine, Ser: 1.16 mg/dL — ABNORMAL HIGH (ref 0.44–1.00)
GFR calc Af Amer: 53 mL/min — ABNORMAL LOW (ref >60)
GFR, EST NON AFRICAN AMERICAN: 46 mL/min — AB (ref >60)
GLUCOSE: 94 mg/dL (ref 65–99)
POTASSIUM: 4.1 mmol/L (ref 3.5–5.1)
Sodium: 136 mmol/L (ref 135–145)

## 2014-09-09 LAB — CBC
HEMATOCRIT: 40.8 % (ref 35.0–47.0)
HEMOGLOBIN: 13.4 g/dL (ref 12.0–16.0)
MCH: 32.4 pg (ref 26.0–34.0)
MCHC: 33 g/dL (ref 32.0–36.0)
MCV: 98.2 fL (ref 80.0–100.0)
Platelets: 195 10*3/uL (ref 150–440)
RBC: 4.15 MIL/uL (ref 3.80–5.20)
RDW: 13.1 % (ref 11.5–14.5)
WBC: 3.6 10*3/uL (ref 3.6–11.0)

## 2014-09-09 LAB — NM MYOCAR MULTI W/SPECT W/WALL MOTION / EF
CHL CUP NUCLEAR SDS: 1
CHL CUP RESTING HR STRESS: 77 {beats}/min
CSEPPHR: 122 {beats}/min
LVDIAVOL: 169 mL
LVSYSVOL: 106 mL
SRS: 15
SSS: 15
TID: 1.19

## 2014-09-09 LAB — TROPONIN I: Troponin I: <0.03 ng/mL (ref <0.031)

## 2014-09-09 MED ORDER — TECHNETIUM TC 99M SESTAMIBI - CARDIOLITE
13.8270 | Freq: Once | INTRAVENOUS | Status: AC | PRN
  Administered 2014-09-09: 09:00:00 13.827 via INTRAVENOUS

## 2014-09-09 MED ORDER — REGADENOSON 0.4 MG/5ML IV SOLN
0.4000 mg | Freq: Once | INTRAVENOUS | Status: AC
  Administered 2014-09-09: 0.4 mg via INTRAVENOUS

## 2014-09-09 MED ORDER — TECHNETIUM TC 99M SESTAMIBI - CARDIOLITE
32.5100 | Freq: Once | INTRAVENOUS | Status: AC | PRN
  Administered 2014-09-09: 10:00:00 32.51 via INTRAVENOUS

## 2014-09-09 NOTE — Evaluation (Signed)
Physical Therapy Evaluation Patient Details Name: Cynthia [name] MRN: [id number] DOB: Jun 11, 1940 Today's Date: 09/09/2014   History of Present Illness  Patient is a 74 y/o female admitted under observation status after experiencing chest pain yesterday.   Clinical Impression  Patient is a 74 y/o female that presents from home where she intermittently uses a RW for assistance and occasionally goes into the community (to church etc) though primarily she remains at home. Patient states she has had several falls in the past, with one resulting in a now chronic left hip pain. She was worked up for this and told she has "arthritis". Patient ambulates with mild balance deficits during this session and discussed with patient use of a RW is preferable for her at this point given her falls history. Patient would benefit from  Manual and/or aquatic PT to help manage her left hip pain as well as increase her activity tolerance. Skilled acute PT services continue to be indicated for her balance deficits.     Follow Up Recommendations Outpatient PT (Aquatic therapy preferred)     Equipment Recommendations       Recommendations for Other Services       Precautions / Restrictions Precautions Precautions: Fall Restrictions Weight Bearing Restrictions: No      Mobility  Bed Mobility Overal bed mobility: Modified Independent             General bed mobility comments: Patient is able to complete bed mobility in this session with HOB elevated.   Transfers Overall transfer level: Independent               General transfer comment: Patient requires no assistance for sit to stand transfer.   Ambulation/Gait Ambulation/Gait assistance: Min guard Ambulation Distance (Feet): 200 Feet   Gait Pattern/deviations: WFL(Within Functional Limits);Antalgic;Drifts right/left   Gait velocity interpretation: at or above normal speed for age/gender General Gait Details: Patient begins to display  antalgic gait after ~ 160' of ambulation, otherwise she is [facility] (mild drifting with head turns).   Stairs            Wheelchair Mobility    Modified Rankin (Stroke Patients Only)       Balance Overall balance assessment: Needs assistance   Sitting balance-Leahy Scale: Good Sitting balance - Comments: No balance deficits noted.    Standing balance support: No upper extremity supported Standing balance-Leahy Scale: Good Standing balance comment: Modified DGI of 10/12, unable to change speeds as she began having left hip pain which seems to be chronic.                              Pertinent Vitals/Pain Pain Assessment:  (Patient complains of left hip pain after ambulation, no pain prior to ambulation.)    Home Living Family/patient expects to be discharged to:: Private residence Living Arrangements: Spouse/significant other Available Help at Discharge: Family Type of Home: House Home Access: Stairs to enter Entrance Stairs-Rails: Can reach both Entrance Stairs-Number of Steps: 3 Home Layout: One level Home Equipment: Walker - 2 wheels;Walker - 4 wheels      Prior Function Level of Independence: Independent         Comments: Patient intermittently uses RW     Hand Dominance        Extremity/Trunk Assessment   Upper Extremity Assessment: Overall WFL for tasks assessed           Lower Extremity Assessment: Overall WFL for  tasks assessed         Communication   Communication: No difficulties  Cognition Arousal/Alertness: Awake/alert Behavior During Therapy: WFL for tasks assessed/performed Overall Cognitive Status: Within Functional Limits for tasks assessed                      General Comments      Exercises        Assessment/Plan    PT Assessment Patient needs continued PT services  PT Diagnosis Difficulty walking   PT Problem List Decreased strength;Pain;Decreased mobility;Decreased balance  PT Treatment  Interventions Gait training;Balance training;DME instruction;Therapeutic activities;Therapeutic exercise;Manual techniques   PT Goals (Current goals can be found in the Care Plan section) Acute Rehab PT Goals Patient Stated Goal: To return home safely  PT Goal Formulation: With patient/family Time For Goal Achievement: 09/23/14 Potential to Achieve Goals: Good    Frequency Min 2X/week   Barriers to discharge        Co-evaluation               End of Session Equipment Utilized During Treatment: Gait belt Activity Tolerance: Patient tolerated treatment well Patient left: in bed;with call bell/phone within reach;with bed alarm set;with family/visitor present Nurse Communication: Mobility status    Functional Assessment Tool Used: Modified DGI, clinical judgement  Functional Limitation: Mobility: Walking and moving around Mobility: Walking and Moving Around Current Status (Z6109): At least 1 percent but less than 20 percent impaired, limited or restricted Mobility: Walking and Moving Around Goal Status [phone]): At least 1 percent but less than 20 percent impaired, limited or restricted    Time: 1451-1503 PT Time Calculation (min) (ACUTE ONLY): 12 min   Charges:   PT Evaluation $Initial PT Evaluation Tier I: 1 Procedure     PT G Codes:   PT G-Codes **NOT FOR INPATIENT CLASS** Functional Assessment Tool Used: Modified DGI, clinical judgement  Functional Limitation: Mobility: Walking and moving around Mobility: Walking and Moving Around Current Status (U9811): At least 1 percent but less than 20 percent impaired, limited or restricted Mobility: Walking and Moving Around Goal Status [phone]): At least 1 percent but less than 20 percent impaired, limited or restricted   Kerin Ransom, PT, DPT    09/09/2014, 3:18 PM

## 2014-09-09 NOTE — Care Management (Signed)
Patient from home with husband. Patient uses [facility] in [place] to obtain her medicaiton. Patient states that she has a walker at home and uses it when necessary. Patient has no other home equipment, or home O2. Patient states that her husband transports her when needed. Patient expresses need for a home aide. She states that she would like this person to "be able to help her with things around the house, like house work". I explained to her that insurance did not cover aides such as this, however there were many agencies that could. I provided her with a list of agencies and explained that it would be an out of pocket expense. PT recommends outpatient PT. MD notified to place order prior to discharge. RNCM signing off

## 2014-09-09 NOTE — Discharge Summary (Addendum)
Cynthia [name], is a 74 y.o. female  DOB 11/26/1940  MRN [id number].  Admission date:  09/08/2014  Admitting Physician  Shaune Pollack, MD  Discharge Date:  09/09/2014   Primary MD  No primary care provider on file.  Recommendations for primary care physician for things to follow:   Follow up with [facility]  Admission Diagnosis  Chest pain, unspecified chest pain type [R07.9]   Discharge Diagnosis  Chest pain, unspecified chest pain type [R07.9]    Principal Problem:   Chest pain Active Problems:   Dehydration      Past Medical History  Diagnosis Date  . Coronary artery disease   . Hx of CABG 2013  . Hypertension   . Hyperlipidemia   . GERD (gastroesophageal reflux disease)     Past Surgical History  Procedure Laterality Date  . Cardiac surgery         History of present illness and  Hospital Course:     Kindly see H&P for history of present illness and admission details, please review complete Labs, Consult reports and Test reports for all details in brief  HPI  from the history and physical done on the day of admission  74 year old female with history of coronary artery disease status post CABG, hypertension, hyperlipidemia, renal insufficiency came in because of chest pain and nausea. Patient admitted to telemetry for chest pain evaluation. Troponins have been negative 3.  Hospital Course  #1 chest pain likely secondary to angina. Troponins have been negative. Patient went for Lexiscan stress test results are pending. If the Lexiscan stress test is negative for ischemia patient would be discharged otherwise patient will need to stay for cardiac catheterization. Patient continued on Imdur, hydralazine, aspirin, Plavix, his inhibitors. We are holding the beta blocker secondary to bradycardia.  #2:  History of COPD with continued tobacco abuse. No wheezing. Continue home medications. #3 headache and migraines patient requesting pain medications. #4 deconditioning patient is requesting home health so we will consult physical therapy and social worker.  #5 CK D2;. Stable Discharge Condition: Stable   Follow UP      Discharge Instructions  and  Discharge Medications        Medication List    TAKE these medications        aspirin EC 81 MG tablet  Take 81 mg by mouth daily.     clopidogrel 75 MG tablet  Commonly known as:  PLAVIX  Take 75 mg by mouth daily.     enalapril 2.5 MG tablet  Commonly known as:  VASOTEC  Take 2.5 mg by mouth 2 (two) times daily.     furosemide 40 MG tablet  Commonly known as:  LASIX  Take 40 mg by mouth daily as needed for edema.     hydrALAZINE 25 MG tablet  Commonly known as:  APRESOLINE  Take 25 mg by mouth 3 (three) times daily.     HYDROmorphone 2 MG tablet  Commonly known as:  DILAUDID  Take 2 mg by mouth 3 (three) times daily as needed for severe pain.     isosorbide mononitrate 30 MG 24 hr tablet  Commonly known as:  IMDUR  Take 30 mg by mouth daily.     latanoprost 0.005 % ophthalmic solution  Commonly known as:  XALATAN  Place 1 drop into both eyes at bedtime.     ondansetron 4 MG tablet  Commonly known as:  ZOFRAN  Take 4 mg by mouth 3 (three) times  daily as needed for nausea or vomiting.     pantoprazole 40 MG tablet  Commonly known as:  PROTONIX  Take 40 mg by mouth 2 (two) times daily.     traMADol 50 MG tablet  Commonly known as:  ULTRAM  Take 1 tablet (50 mg total) by mouth every 6 (six) hours as needed.          Diet and Activity recommendation: See Discharge Instructions above   Consults obtained - cardiology Major procedures and Radiology Reports - PLEASE review detailed and final reports for all details, in brief -  Lexi scan   Dg Chest Portable 1 View  09/08/2014   CLINICAL DATA:   Midsternal chest pain today.  EXAM: PORTABLE CHEST - 1 VIEW  COMPARISON:  Single view of the chest 08/03/2014 and 05/18/2014.  FINDINGS: The patient is status post CABG. There is cardiomegaly without edema. The lungs are clear. No pneumothorax or pleural effusion.  IMPRESSION: Cardiomegaly without acute disease.   Electronically Signed   By: Drusilla Kanner M.D.   On: 09/08/2014 12:42    Micro Result  No results found for this or any previous visit (from the past 240 hour(s)).     Today   Subjective:   Shakeema Depner today in today, some headache is present requesting the Percocet. Otherwise eager to go home . chest pain. .   Blood pressure 154/82, pulse 82, temperature 98 F (36.7 C), temperature source Oral, resp. rate 18, height 6' (1.829 m), weight 108.863 kg (240 lb), SpO2 97 %.   Intake/Output Summary (Last 24 hours) at 09/09/14 1322 Last data filed at 09/09/14 1220  Gross per 24 hour  Intake   1230 ml  Output   1200 ml  Net     30 ml    Exam Awake Alert, Oriented x 3, No new F.N deficits, Normal affect .AT,PERRAL Supple Neck,No JVD, No cervical lymphadenopathy appriciated.  Symmetrical Chest wall movement, Good air movement bilaterally, CTAB RRR,No Gallops,Rubs or new Murmurs, No Parasternal Heave +ve B.Sounds, Abd Soft, Non tender, No organomegaly appriciated, No rebound -guarding or rigidity. No Cyanosis, Clubbing or edema, No new Rash or bruise  Data Review   CBC w Diff: Lab Results  Component Value Date   WBC 3.6 09/09/2014   WBC 3.2* 05/19/2014   HGB 13.4 09/09/2014   HGB 12.1 05/19/2014   HCT 40.8 09/09/2014   HCT 37.3 05/19/2014   PLT 195 09/09/2014   PLT 185 05/19/2014   LYMPHOPCT 36 08/03/2014   LYMPHOPCT 26.4 05/19/2014   MONOPCT 13 08/03/2014   MONOPCT 12.4 05/19/2014   EOSPCT 3 08/03/2014   EOSPCT 5.1 05/19/2014   BASOPCT 1 08/03/2014   BASOPCT 0.6 05/19/2014    CMP: Lab Results  Component Value Date   NA 136 09/09/2014   NA 141  05/19/2014   K 4.1 09/09/2014   K 4.1 05/19/2014   CL 106 09/09/2014   CL 104 05/19/2014   CO2 24 09/09/2014   CO2 29 05/19/2014   BUN 29* 09/09/2014   BUN 24* 05/19/2014   CREATININE 1.16* 09/09/2014   CREATININE 1.41* 05/19/2014   PROT 6.8 09/08/2014   PROT 7.1 05/18/2014   ALBUMIN 3.8 09/08/2014   ALBUMIN 4.0 05/18/2014   BILITOT 1.1 09/08/2014   BILITOT 0.6 05/18/2014   ALKPHOS 73 09/08/2014   ALKPHOS 69 05/18/2014   AST 17 09/08/2014   AST 15 05/18/2014   ALT 11* 09/08/2014   ALT 11* 05/18/2014   Lexisccan  showed prior MI ,moderate risk scan.Dr.paraschos told [facility] charge RN that stress test shows old changes.and she can be discharged. So will d/c home and arrange out pt PT.   Total Time in preparing paper work, data evaluation and todays exam - 35 minutes  Teagyn Fishel M.D on 09/09/2014 at 1:22 PM

## 2014-09-09 NOTE — Progress Notes (Signed)
Patient is discharge home  With home PT and Nurse ,in a stable condition, denies pain no  Distress noted at time of discharge, summary given verbalized understanding , left with her husband

## 2014-09-09 NOTE — Consult Note (Signed)
Reason for Consult: angina chest pain coronary disease Referring Physician:  Dr. Bridgett Larsson  hospitalist  Cynthia [name] is an 74 y.o. female.  HPI:  74 year old  black female history of severe coronary disease coronary bypass surgery hypertension hyperlipidemia GERD renal insufficiency who complains of recent nausea recurrent chest pain and anginal symptoms midsternal with shortness of breath. Patient was last evaluated about a year ago but still had chest pain a few hours prior to presentation chills admit for further evaluation and care. Patient denies black or spells or syncope nonsmoker. Was had issues with compliance. Because of persistent recurrent nature of her chest.  She presented for further evaluation management.  Past Medical History  Diagnosis Date  . Coronary artery disease   . Hx of CABG 2013  . Hypertension   . Hyperlipidemia   . GERD (gastroesophageal reflux disease)     Past Surgical History  Procedure Laterality Date  . Cardiac surgery      History reviewed. No pertinent family history.  Social History:  reports that she has quit smoking. Her smoking use included Cigarettes. She has a 17.5 pack-year smoking history. She has never used smokeless tobacco. She reports that she does not drink alcohol or use illicit drugs.  Allergies:  Allergies  Allergen Reactions  . Penicillins Swelling and Other (See Comments)    Pt states that her tongue swells and turns white.      Medications: I have reviewed the patient's current medications.  Results for orders placed or performed during the hospital encounter of 09/08/14 (from the past 48 hour(s))  CBC     Status: None   Collection Time: 09/08/14 12:11 PM  Result Value Ref Range   WBC 4.2 3.6 - 11.0 K/uL   RBC 4.35 3.80 - 5.20 MIL/uL   Hemoglobin 14.0 12.0 - 16.0 g/dL   HCT 42.4 35.0 - 47.0 %   MCV 97.5 80.0 - 100.0 fL   MCH 32.2 26.0 - 34.0 pg   MCHC 33.0 32.0 - 36.0 g/dL   RDW 13.3 11.5 - 14.5 %   Platelets 208  150 - 440 K/uL  Comprehensive metabolic panel     Status: Abnormal   Collection Time: 09/08/14 12:11 PM  Result Value Ref Range   Sodium 138 135 - 145 mmol/L   Potassium 4.4 3.5 - 5.1 mmol/L    Comment: HEMOLYSIS AT THIS LEVEL MAY AFFECT RESULT   Chloride 108 101 - 111 mmol/L   CO2 21 (L) 22 - 32 mmol/L   Glucose, Bld 91 65 - 99 mg/dL   BUN 29 (H) 6 - 20 mg/dL   Creatinine, Ser 1.23 (H) 0.44 - 1.00 mg/dL   Calcium 8.6 (L) 8.9 - 10.3 mg/dL   Total Protein 6.8 6.5 - 8.1 g/dL   Albumin 3.8 3.5 - 5.0 g/dL   AST 17 15 - 41 U/L   ALT 11 (L) 14 - 54 U/L   Alkaline Phosphatase 73 38 - 126 U/L   Total Bilirubin 1.1 0.3 - 1.2 mg/dL   GFR calc non Af Amer 42 (L) >60 mL/min   GFR calc Af Amer 49 (L) >60 mL/min    Comment: (NOTE) The eGFR has been calculated using the CKD EPI equation. This calculation has not been validated in all clinical situations. eGFR's persistently <60 mL/min signify possible Chronic Kidney Disease.    Anion gap 9 5 - 15  Troponin I     Status: None   Collection Time: 09/08/14 12:11 PM  Result Value Ref Range   Troponin I <0.03 <0.031 ng/mL    Comment:        NO INDICATION OF MYOCARDIAL INJURY.   Magnesium     Status: None   Collection Time: 09/08/14  6:02 PM  Result Value Ref Range   Magnesium 2.0 1.7 - 2.4 mg/dL  Troponin I     Status: None   Collection Time: 09/08/14  6:02 PM  Result Value Ref Range   Troponin I <0.03 <0.031 ng/mL    Comment:        NO INDICATION OF MYOCARDIAL INJURY.   Troponin I     Status: None   Collection Time: 09/08/14 11:31 PM  Result Value Ref Range   Troponin I <0.03 <0.031 ng/mL    Comment:        NO INDICATION OF MYOCARDIAL INJURY.   Basic metabolic panel     Status: Abnormal   Collection Time: 09/09/14  5:40 AM  Result Value Ref Range   Sodium 136 135 - 145 mmol/L   Potassium 4.1 3.5 - 5.1 mmol/L   Chloride 106 101 - 111 mmol/L   CO2 24 22 - 32 mmol/L   Glucose, Bld 94 65 - 99 mg/dL   BUN 29 (H) 6 - 20 mg/dL    Creatinine, Ser 1.16 (H) 0.44 - 1.00 mg/dL   Calcium 8.7 (L) 8.9 - 10.3 mg/dL   GFR calc non Af Amer 46 (L) >60 mL/min   GFR calc Af Amer 53 (L) >60 mL/min    Comment: (NOTE) The eGFR has been calculated using the CKD EPI equation. This calculation has not been validated in all clinical situations. eGFR's persistently <60 mL/min signify possible Chronic Kidney Disease.    Anion gap 6 5 - 15  CBC     Status: None   Collection Time: 09/09/14  5:40 AM  Result Value Ref Range   WBC 3.6 3.6 - 11.0 K/uL   RBC 4.15 3.80 - 5.20 MIL/uL   Hemoglobin 13.4 12.0 - 16.0 g/dL   HCT 40.8 35.0 - 47.0 %   MCV 98.2 80.0 - 100.0 fL   MCH 32.4 26.0 - 34.0 pg   MCHC 33.0 32.0 - 36.0 g/dL   RDW 13.1 11.5 - 14.5 %   Platelets 195 150 - 440 K/uL  Troponin I     Status: None   Collection Time: 09/09/14  5:40 AM  Result Value Ref Range   Troponin I <0.03 <0.031 ng/mL    Comment:        NO INDICATION OF MYOCARDIAL INJURY.     Dg Chest Portable 1 View  09/08/2014   CLINICAL DATA:  Midsternal chest pain today.  EXAM: PORTABLE CHEST - 1 VIEW  COMPARISON:  Single view of the chest 08/03/2014 and 05/18/2014.  FINDINGS: The patient is status post CABG. There is cardiomegaly without edema. The lungs are clear. No pneumothorax or pleural effusion.  IMPRESSION: Cardiomegaly without acute disease.   Electronically Signed   By: Inge Rise M.D.   On: 09/08/2014 12:42    Review of Systems  Constitutional: Positive for malaise/fatigue.  HENT: Negative.   Eyes: Negative.   Respiratory: Positive for shortness of breath.   Cardiovascular: Positive for chest pain.  Gastrointestinal: Negative.   Genitourinary: Negative.   Musculoskeletal: Positive for back pain.  Skin: Negative.   Neurological: Positive for weakness.  Endo/Heme/Allergies: Negative.   Psychiatric/Behavioral: Negative.    Blood pressure 154/82, pulse 82, temperature 98 F (36.7  C), temperature source Oral, resp. rate 18, height 6' (1.829  m), weight 108.863 kg (240 lb), SpO2 97 %. Physical Exam  Constitutional: She is oriented to person, place, and time. She appears well-developed and well-nourished.  HENT:  Head: Normocephalic and atraumatic.  Right Ear: External ear normal.  Eyes: Conjunctivae and EOM are normal. Pupils are equal, round, and reactive to light.  Neck: Normal range of motion. Neck supple.  Cardiovascular: Normal rate and regular rhythm.   Murmur heard. Respiratory: Effort normal and breath sounds normal.  GI: Soft. Bowel sounds are normal.  Musculoskeletal: Normal range of motion.  Neurological: She is alert and oriented to person, place, and time. She has normal reflexes.  Skin: Skin is warm and dry.  Psychiatric: She has a normal mood and affect.    Assessment/Plan:  unstable angina coronary disease  hypertension  hyperlipidemia  renal insufficiency  GERD  Chronic obstructive pulmonary disease  coronary bypass surgery  chronic back pain  history of smoking  PVD .  PLAN  rule out for myocardial infarction  follow up cardiac enzymes  follow up EKGs  recommend functional study  continue Imdur therapy for angina  hypertension control with hydralazine enalapril Imdur or  aspirin Plavix ACE-inhibitor for to his carotid vascular disease  hold off on beta blockers because of history of bradycardia  continue Protonix for reflux symptoms  inhalers for Chronic obstructive pulmonary disease symptoms If functional study abnormal or symptoms persist would consider cardiac catheterization  CALLWOOD,DWAYNE D. 09/09/2014, 1:02 PM

## 2014-11-30 ENCOUNTER — Encounter: Payer: Self-pay | Admitting: Emergency Medicine

## 2014-11-30 ENCOUNTER — Emergency Department: Payer: Medicare HMO

## 2014-11-30 ENCOUNTER — Emergency Department
Admission: EM | Admit: 2014-11-30 | Discharge: 2014-11-30 | Disposition: A | Discharge status: Home / Self Care | Payer: Medicare HMO | Attending: Emergency Medicine | Admitting: Emergency Medicine

## 2014-11-30 DIAGNOSIS — Z79899 Other long term (current) drug therapy: Secondary | ICD-10-CM | POA: Diagnosis not present

## 2014-11-30 DIAGNOSIS — I10 Essential (primary) hypertension: Secondary | ICD-10-CM | POA: Diagnosis not present

## 2014-11-30 DIAGNOSIS — Z880 Allergy status to penicillin: Secondary | ICD-10-CM | POA: Diagnosis not present

## 2014-11-30 DIAGNOSIS — Z7982 Long term (current) use of aspirin: Secondary | ICD-10-CM | POA: Diagnosis not present

## 2014-11-30 DIAGNOSIS — M79601 Pain in right arm: Secondary | ICD-10-CM | POA: Diagnosis not present

## 2014-11-30 DIAGNOSIS — Z7902 Long term (current) use of antithrombotics/antiplatelets: Secondary | ICD-10-CM | POA: Insufficient documentation

## 2014-11-30 DIAGNOSIS — R079 Chest pain, unspecified: Secondary | ICD-10-CM | POA: Diagnosis present

## 2014-11-30 DIAGNOSIS — M5412 Radiculopathy, cervical region: Secondary | ICD-10-CM | POA: Diagnosis not present

## 2014-11-30 DIAGNOSIS — Z87891 Personal history of nicotine dependence: Secondary | ICD-10-CM | POA: Insufficient documentation

## 2014-11-30 LAB — CBC
HCT: 39.8 % (ref 35.0–47.0)
Hemoglobin: 13.6 g/dL (ref 12.0–16.0)
MCH: 33.8 pg (ref 26.0–34.0)
MCHC: 34.1 g/dL (ref 32.0–36.0)
MCV: 99 fL (ref 80.0–100.0)
PLATELETS: 181 10*3/uL (ref 150–440)
RBC: 4.02 MIL/uL (ref 3.80–5.20)
RDW: 13 % (ref 11.5–14.5)
WBC: 3.1 10*3/uL — AB (ref 3.6–11.0)

## 2014-11-30 LAB — BASIC METABOLIC PANEL
ANION GAP: 7 (ref 5–15)
BUN: 23 mg/dL — AB (ref 6–20)
CALCIUM: 9.3 mg/dL (ref 8.9–10.3)
CO2: 25 mmol/L (ref 22–32)
Chloride: 104 mmol/L (ref 101–111)
Creatinine, Ser: 1.18 mg/dL — ABNORMAL HIGH (ref 0.44–1.00)
GFR calc Af Amer: 52 mL/min — ABNORMAL LOW (ref >60)
GFR, EST NON AFRICAN AMERICAN: 45 mL/min — AB (ref >60)
GLUCOSE: 95 mg/dL (ref 65–99)
POTASSIUM: 4 mmol/L (ref 3.5–5.1)
Sodium: 136 mmol/L (ref 135–145)

## 2014-11-30 LAB — TROPONIN I

## 2014-11-30 MED ORDER — IBUPROFEN 600 MG PO TABS
600.0000 mg | ORAL_TABLET | Freq: Once | ORAL | Status: AC
  Administered 2014-11-30: 600 mg via ORAL
  Filled 2014-11-30: qty 1

## 2014-11-30 MED ORDER — IBUPROFEN 600 MG PO TABS: 600.0000 mg | ORAL_TABLET | Freq: Three times a day (TID) | ORAL | Status: DC | PRN

## 2014-11-30 NOTE — ED Provider Notes (Signed)
[facility] Emergency Department Provider Note   ____________________________________________  Time seen: 7:45 AM I have reviewed the triage vital signs and the triage nursing note.  HISTORY  Chief Complaint Chest Pain   Historian Patient and husband  HPI Cynthia [name] is a 74 y.o. female who woke up this morning with right arm pain which is sharpand located at the upper arm and extends down the elbow to the right wrist.  No known trauma or overuse. She reportedly called EMS due to chest pain, but now she is denying any chest pain. 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 She does have a stent and has had a bypass.  No neck pain or neck injury. At worst pain was moderate to severe, and currently there is no pain.  No exacerbating or alleviating symptoms. She's never had this before.    Past Medical History  Diagnosis Date  . Coronary artery disease   . Hx of CABG 2013  . Hypertension   . Hyperlipidemia   . GERD (gastroesophageal reflux disease)     Patient Active Problem List   Diagnosis Date Noted  . Chest pain 09/08/2014  . Dehydration 09/08/2014    Past Surgical History  Procedure Laterality Date  . Cardiac surgery      Current Outpatient Rx  Name  Route  Sig  Dispense  Refill  . aspirin EC 81 MG tablet   Oral   Take 81 mg by mouth daily.         . clopidogrel (PLAVIX) 75 MG tablet   Oral   Take 75 mg by mouth daily.         . enalapril (VASOTEC) 2.5 MG tablet   Oral   Take 2.5 mg by mouth 2 (two) times daily.         . furosemide (LASIX) 40 MG tablet   Oral   Take 40 mg by mouth daily as needed for edema.         . hydrALAZINE (APRESOLINE) 25 MG tablet   Oral   Take 25 mg by mouth 3 (three) times daily.         Marland Kitchen. HYDROmorphone (DILAUDID) 2 MG  tablet   Oral   Take 2 mg by mouth 3 (three) times daily as needed for severe pain.         Marland Kitchen. ibuprofen (ADVIL,MOTRIN) 600 MG tablet   Oral   Take 1 tablet (600 mg total) by mouth every 8 (eight) hours as needed.   20 tablet   0   . isosorbide mononitrate (IMDUR) 30 MG 24 hr tablet   Oral   Take 30 mg by mouth daily.         Marland Kitchen. latanoprost (XALATAN) 0.005 % ophthalmic solution   Both Eyes   Place 1 drop into both eyes at bedtime.         . ondansetron (ZOFRAN) 4 MG tablet   Oral   Take 4 mg by mouth 3 (three) times daily as needed for nausea or vomiting.         . pantoprazole (PROTONIX) 40 MG tablet   Oral   Take 40 mg by mouth 2 (two) times daily.         . traMADol (ULTRAM) 50 MG tablet  Oral   Take 1 tablet (50 mg total) by mouth every 6 (six) hours as needed. Patient not taking: Reported on 09/08/2014   20 tablet   0     Allergies Penicillins  History reviewed. No pertinent family history.  Social History Social History  Substance Use Topics  . Smoking status: Former Smoker -- 0.50 packs/day for 35 years    Types: Cigarettes  . Smokeless tobacco: Never Used  . Alcohol Use: No    Review of Systems  Constitutional: Negative for fever. Eyes: Negative for visual changes. ENT: Negative for sore throat. Cardiovascular: Negative for chest pain. Respiratory: Negative for shortness of breath. Gastrointestinal: Negative for abdominal pain, vomiting and diarrhea. Genitourinary: Negative for dysuria. Musculoskeletal: Negative for back pain. Skin: Negative for rash. Neurological: Negative for headache. 10 point Review of Systems otherwise negative ____________________________________________   PHYSICAL EXAM:  VITAL SIGNS: ED Triage Vitals  Enc Vitals Group     BP --      Pulse Rate 11/30/14 0740 75     Resp --      Temp --      Temp src --      SpO2 11/30/14 0740 97 %     Weight 11/30/14 0747 236 lb (107.049 kg)     Height 11/30/14 0747 6'  (1.829 m)     Head Cir --      Peak Flow --      Pain Score 11/30/14 0741 10     Pain Loc --      Pain Edu? --      Excl. in GC? --      Constitutional: Alert and oriented. Well appearing and in no distress. Eyes: Conjunctivae are normal. PERRL. Normal extraocular movements. ENT   Head: Normocephalic and atraumatic.   Nose: No congestion/rhinnorhea.   Mouth/Throat: Mucous membranes are moist.   Neck: No stridor. Cardiovascular/Chest: Normal rate, regular rhythm.  No murmurs, rubs, or gallops. Respiratory: Normal respiratory effort without tachypnea nor retractions. Breath sounds are clear and equal bilaterally. No wheezes/rales/rhonchi. Gastrointestinal: Soft. No distention, no guarding, no rebound. Nontender.  Obese.  Genitourinary/rectal:Deferred Musculoskeletal: Nontender with normal range of motion in all extremities. No joint effusions.  No lower extremity tenderness. Trace edema left lower extremity. Neurologic:  Normal speech and language. No gross or focal neurologic deficits are appreciated. Skin:  Skin is warm, dry and intact. No rash noted. Psychiatric: Mood and affect are normal. Speech and behavior are normal. Patient exhibits appropriate insight and judgment.  ____________________________________________   EKG I, Governor Rooks, MD, the attending physician have personally viewed and interpreted all ECGs.  77 bpm. Sinus rhythm. Frequent PVC. Normal axis. Right bundle branch block. Nonspecific ST and T-wave. ____________________________________________  LABS (pertinent positives/negatives)  BUN 23 and creatinine 1.18, and other wise without significant abnormality on metabolic panel White blood count 3.1, hemoglobin 13.6 and platelet count 181 Troponin less than 0.03 Repeat troponin less than 0.03   ____________________________________________  RADIOLOGY All Xrays were viewed by me. Imaging interpreted by Radiologist.  Chest x-ray portable: Similar  findings of cardiomegaly and atherosclerosis without acute cardiopulmonary diseas __________________________________________  PROCEDURES  Procedure(s) performed: None  Critical Care performed: None  ____________________________________________   ED COURSE / ASSESSMENT AND PLAN  CONSULTATIONS: None  Pertinent labs & imaging results that were available during my care of the patient were reviewed by me and considered in my medical decision making (see chart for details).  Clinically I am most suspicious of cervical radiculopathy causing  intermittent sharp shooting pains down the right arm, however given her history of atypical symptoms with her prior heart attacks, I will go ahead and obtain EKG, chest x-ray, and troponin.  I discussed with her conservative management for likely cervical radiculopathy including ibuprofen and follow up with her primary care physician to consider further imaging such as MRI. No indication for emergent MRI.  EKG, chest x-ray, laboratory evaluation are all reassuring. I will check delta troponin and if negative patient will be discharged home. She can follow with primary care physician for symptoms I suspect are due to cervical radiculopathy, as well as a cardiologist for the possible nonspecific chest pain component earlier this morning.  Patient / Family / Caregiver informed of clinical course, medical decision-making process, and agree with plan.   I discussed return precautions, follow-up instructions, and discharged instructions with patient and/or family.  ___________________________________________   FINAL CLINICAL IMPRESSION(S) / ED DIAGNOSES   Final diagnoses:  Cervical radiculopathy  Right arm pain  Chest pain, unspecified chest pain type       Governor Rooks, MD 11/30/14 1236

## 2014-11-30 NOTE — ED Notes (Signed)
Pt arrived by EMS with complaint of chest but pt states pain is from elbow to wrist on her right hand.  EMS states EKG was normal, VS BP 146/78 P86 [id number]. EMS states the pain is intermittent and when it hits the pain screamed. Pt has hx of MI x2 and TIA.

## 2014-11-30 NOTE — Discharge Instructions (Signed)
I think you're right-sided arm pain is likely coming from a pinched nerve that is called radiculopathy. For this take anti-inflammatory ibuprofen as needed for 1 week. Follow-up with a primary care physician to consider further imaging including MRI, possibly with sedation.  Because of the possibility of chest discomfort/cardiac involvement given your history of prior heart attacks, you should follow up with your cardiologist in the next 1-2 days. Call tomorrow morning to make the appointment.  Return to the emergency department for any new or worsening condition including any weakness or numbness of the right arm or any other body part, any chest pain, nausea, sweating, jaw pain, dizziness, passing out, trouble breathing, or any other symptoms concerning to you.   Cervical Radiculopathy Cervical radiculopathy happens when a nerve in the neck (cervical nerve) is pinched or bruised. This condition can develop because of an injury or as part of the normal aging process. Pressure on the cervical nerves can cause pain or numbness that runs from the neck all the way down into the arm and fingers. Usually, this condition gets better with rest. Treatment may be needed if the condition does not improve.  CAUSES This condition may be caused by:  Injury.  Slipped (herniated) disk.  Muscle tightness in the neck because of overuse.  Arthritis.  Breakdown or degeneration in the bones and joints of the spine (spondylosis) due to aging.  Bone spurs that may develop near the cervical nerves. SYMPTOMS Symptoms of this condition include:  Pain that runs from the neck to the arm and hand. The pain can be severe or irritating. It may be worse when the neck is moved.  Numbness or weakness in the affected arm and hand. DIAGNOSIS This condition may be diagnosed based on symptoms, medical history, and a physical exam. You may also have tests, including:  X-rays.  CT scan.  MRI.  Electromyogram  (EMG).  Nerve conduction tests. TREATMENT In many cases, treatment is not needed for this condition. With rest, the condition usually gets better over time. If treatment is needed, options may include:  Wearing a soft neck collar for short periods of time.  Physical therapy to strengthen your neck muscles.  Medicines, such as NSAIDs, oral corticosteroids, or spinal injections.  Surgery. This may be needed if other treatments do not help. Various types of surgery may be done depending on the cause of your problems. HOME CARE INSTRUCTIONS Managing Pain  Take over-the-counter and prescription medicines only as told by your health care provider.  If directed, apply ice to the affected area.  Put ice in a plastic bag.  Place a towel between your skin and the bag.  Leave the ice on for 20 minutes, 2-3 times per day.  If ice does not help, you can try using heat. Take a warm shower or warm bath, or use a heat pack as told by your health care provider.  Try a gentle neck and shoulder massage to help relieve symptoms. Activity  Rest as needed. Follow instructions from your health care provider about any restrictions on activities.  Do stretching and strengthening exercises as told by your health care provider or physical therapist. General Instructions  If you were given a soft collar, wear it as told by your health care provider.  Use a flat pillow when you sleep.  Keep all follow-up visits as told by your health care provider. This is important. SEEK MEDICAL CARE IF:  Your condition does not improve with treatment. SEEK IMMEDIATE MEDICAL  CARE IF:  Your pain gets much worse and cannot be controlled with medicines.  You have weakness or numbness in your hand, arm, face, or leg.  You have a high fever.  You have a stiff, rigid neck.  You lose control of your bowels or your bladder (have incontinence).  You have trouble with walking, balance, or speaking.   This  information is not intended to replace advice given to you by your health care provider. Make sure you discuss any questions you have with your health care provider.   Document Released: 10/05/2000 Document Revised: 10/01/2014 Document Reviewed: 03/06/2014 Elsevier Interactive Patient Education 2016 [employer]   Nonspecific Chest Pain  Chest pain can be caused by many different conditions. There is always a chance that your pain could be related to something serious, such as a heart attack or a blood clot in your lungs. Chest pain can also be caused by conditions that are not life-threatening. If you have chest pain, it is very important to follow up with your health care provider. CAUSES  Chest pain can be caused by:  Heartburn.  Pneumonia or bronchitis.  Anxiety or stress.  Inflammation around your heart (pericarditis) or lung (pleuritis or pleurisy).  A blood clot in your lung.  A collapsed lung (pneumothorax). It can develop suddenly on its own (spontaneous pneumothorax) or from trauma to the chest.  Shingles infection (varicella-zoster virus).  Heart attack.  Damage to the bones, muscles, and cartilage that make up your chest wall. This can include:  Bruised bones due to injury.  Strained muscles or cartilage due to frequent or repeated coughing or overwork.  Fracture to one or more ribs.  Sore cartilage due to inflammation (costochondritis). RISK FACTORS  Risk factors for chest pain may include:  Activities that increase your risk for trauma or injury to your chest.  Respiratory infections or conditions that cause frequent coughing.  Medical conditions or overeating that can cause heartburn.  Heart disease or family history of heart disease.  Conditions or health behaviors that increase your risk of developing a blood clot.  Having had chicken pox (varicella zoster). SIGNS AND SYMPTOMS Chest pain can feel like:  Burning or tingling on the surface of your  chest or deep in your chest.  Crushing, pressure, aching, or squeezing pain.  Dull or sharp pain that is worse when you move, cough, or take a deep breath.  Pain that is also felt in your back, neck, shoulder, or arm, or pain that spreads to any of these areas. Your chest pain may come and go, or it may stay constant. DIAGNOSIS Lab tests or other studies may be needed to find the cause of your pain. Your health care provider may have you take a test called an ambulatory ECG (electrocardiogram). An ECG records your heartbeat patterns at the time the test is performed. You may also have other tests, such as:  Transthoracic echocardiogram (TTE). During echocardiography, sound waves are used to create a picture of all of the heart structures and to look at how blood flows through your heart.  Transesophageal echocardiogram (TEE).This is a more advanced imaging test that obtains images from inside your body. It allows your health care provider to see your heart in finer detail.  Cardiac monitoring. This allows your health care provider to monitor your heart rate and rhythm in real time.  Holter monitor. This is a portable device that records your heartbeat and can help to diagnose abnormal heartbeats. It allows  your health care provider to track your heart activity for several days, if needed.  Stress tests. These can be done through exercise or by taking medicine that makes your heart beat more quickly.  Blood tests.  Imaging tests. TREATMENT  Your treatment depends on what is causing your chest pain. Treatment may include:  Medicines. These may include:  Acid blockers for heartburn.  Anti-inflammatory medicine.  Pain medicine for inflammatory conditions.  Antibiotic medicine, if an infection is present.  Medicines to dissolve blood clots.  Medicines to treat coronary artery disease.  Supportive care for conditions that do not require medicines. This may  include:  Resting.  Applying heat or cold packs to injured areas.  Limiting activities until pain decreases. HOME CARE INSTRUCTIONS  If you were prescribed an antibiotic medicine, finish it all even if you start to feel better.  Avoid any activities that bring on chest pain.  Do not use any tobacco products, including cigarettes, chewing tobacco, or electronic cigarettes. If you need help quitting, ask your health care provider.  Do not drink alcohol.  Take medicines only as directed by your health care provider.  Keep all follow-up visits as directed by your health care provider. This is important. This includes any further testing if your chest pain does not go away.  If heartburn is the cause for your chest pain, you may be told to keep your head raised (elevated) while sleeping. This reduces the chance that acid will go from your stomach into your esophagus.  Make lifestyle changes as directed by your health care provider. These may include:  Getting regular exercise. Ask your health care provider to suggest some activities that are safe for you.  Eating a heart-healthy diet. A registered dietitian can help you to learn healthy eating options.  Maintaining a healthy weight.  Managing diabetes, if necessary.  Reducing stress. SEEK MEDICAL CARE IF:  Your chest pain does not go away after treatment.  You have a rash with blisters on your chest.  You have a fever. SEEK IMMEDIATE MEDICAL CARE IF:   Your chest pain is worse.  You have an increasing cough, or you cough up blood.  You have severe abdominal pain.  You have severe weakness.  You faint.  You have chills.  You have sudden, unexplained chest discomfort.  You have sudden, unexplained discomfort in your arms, back, neck, or jaw.  You have shortness of breath at any time.  You suddenly start to sweat, or your skin gets clammy.  You feel nauseous or you vomit.  You suddenly feel light-headed or  dizzy.  Your heart begins to beat quickly, or it feels like it is skipping beats. These symptoms may represent a serious problem that is an emergency. Do not wait to see if the symptoms will go away. Get medical help right away. Call your local emergency services (911 in the [place] Do not drive yourself to the hospital.   This information is not intended to replace advice given to you by your health care provider. Make sure you discuss any questions you have with your health care provider.   Document Released: 10/20/2004 Document Revised: 01/31/2014 Document Reviewed: 08/16/2013 Elsevier Interactive Patient Education [employer]

## 2014-12-14 IMAGING — US THYROID ULTRASOUND
1 series · 14 of 25 positions shown · non-contrast
Comparison: Cervical CT, 05/16/2013

CLINICAL DATA: Right thyroid nodules seen on CT.

EXAM:
THYROID ULTRASOUND
TECHNIQUE: Ultrasound examination of the thyroid gland and adjacent soft
tissues was performed.

[Series 1: thyroid ultrasound · 0.11mm/px · 14 of 45 slices shown]
[im 1/45]
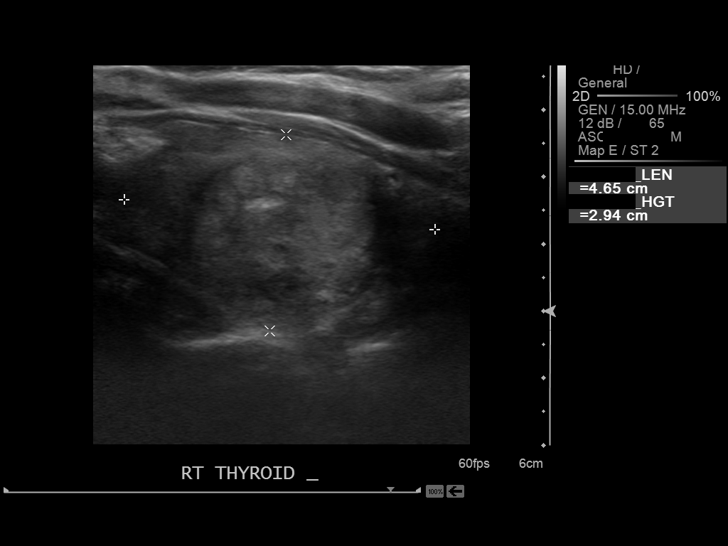
[im 4/45]
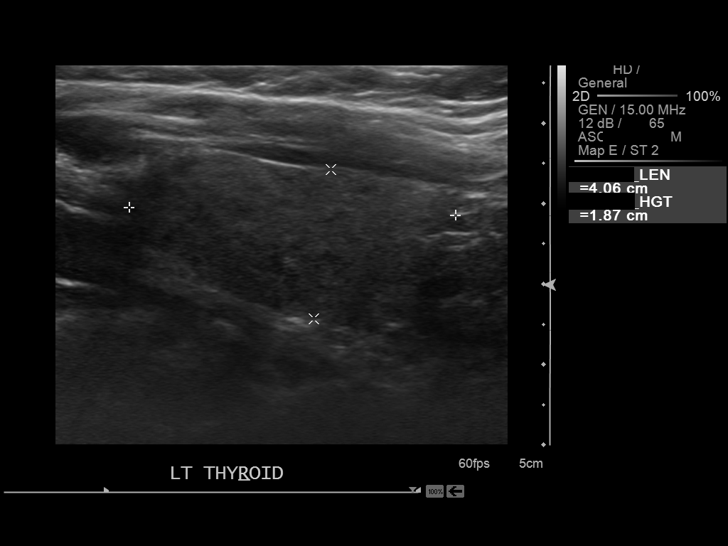
[im 8/45]
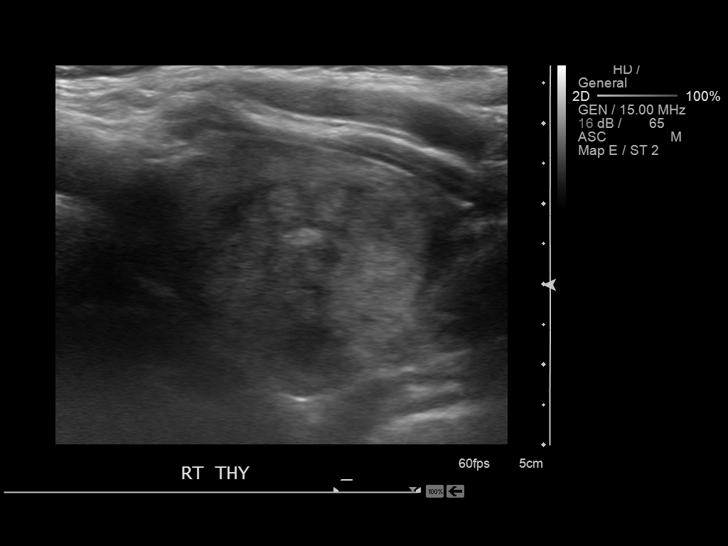
[im 12/45]
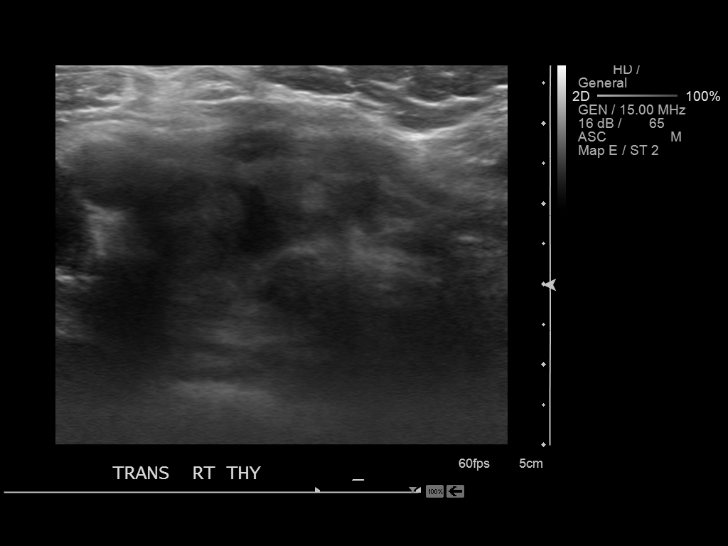
[im 15/45]
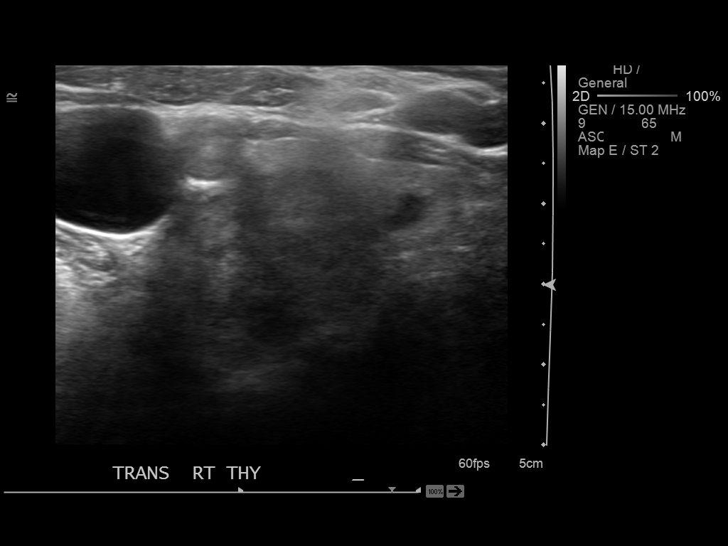
[im 17/45]
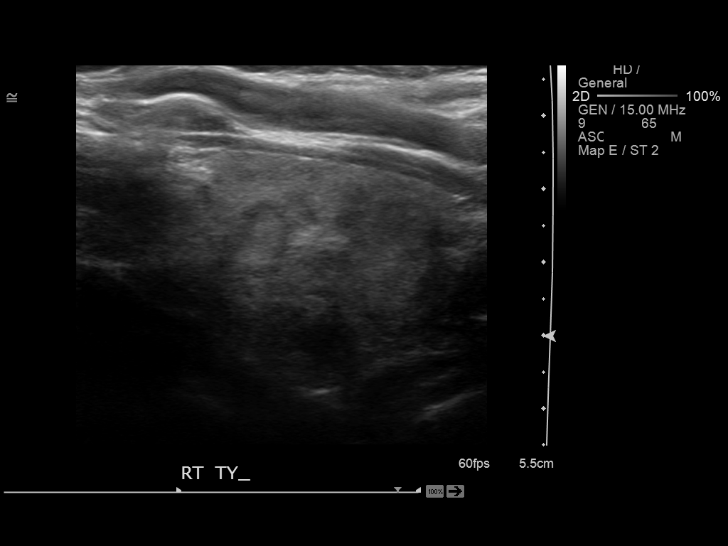
[im 21/45]
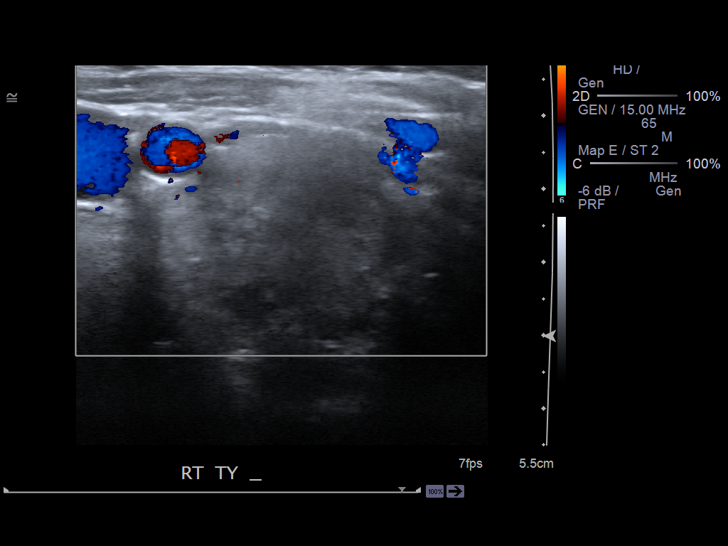
[im 24/45]
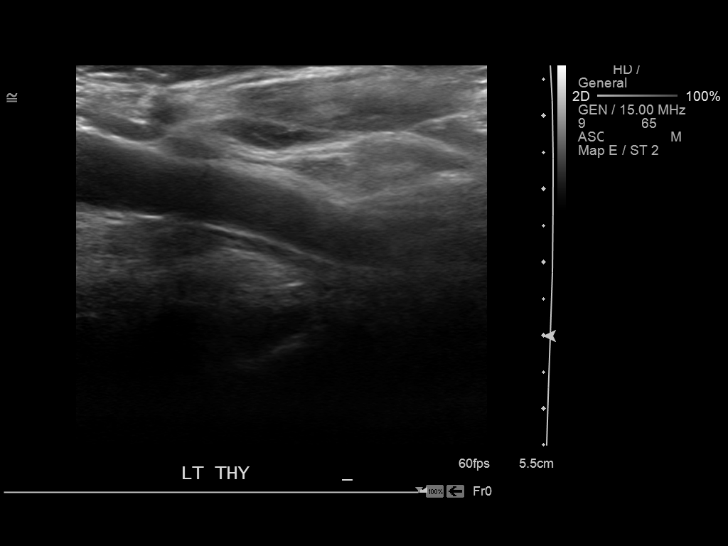
[im 28/45]
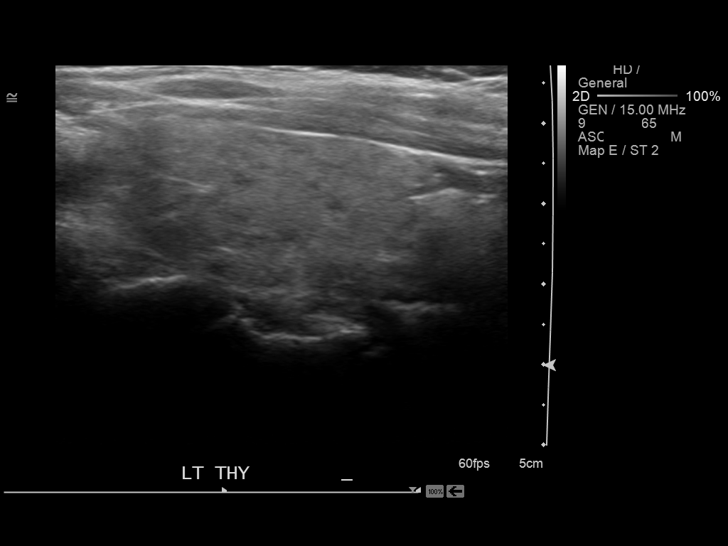
[im 30/45]
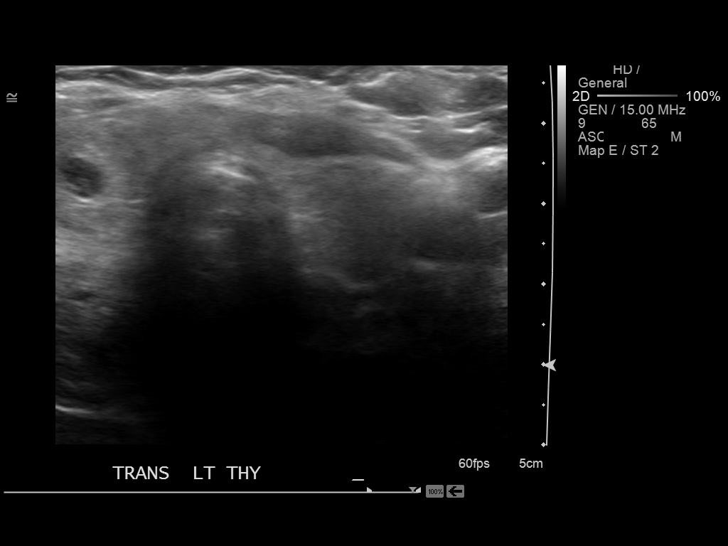
[im 34/45]
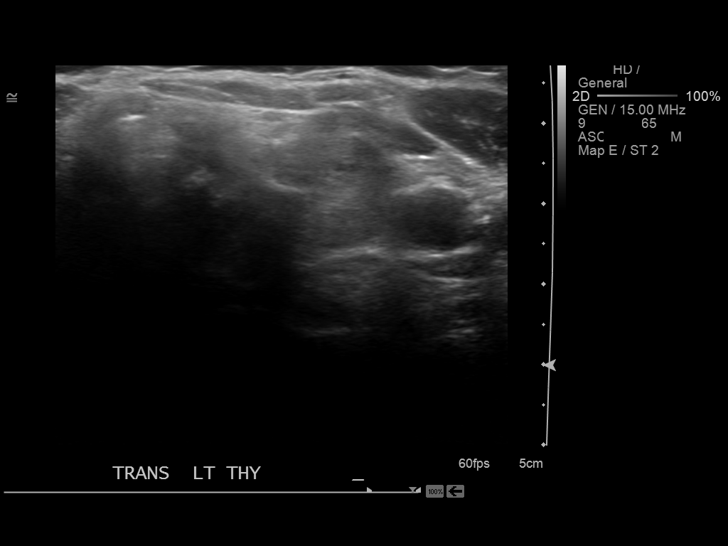
[im 37/45]
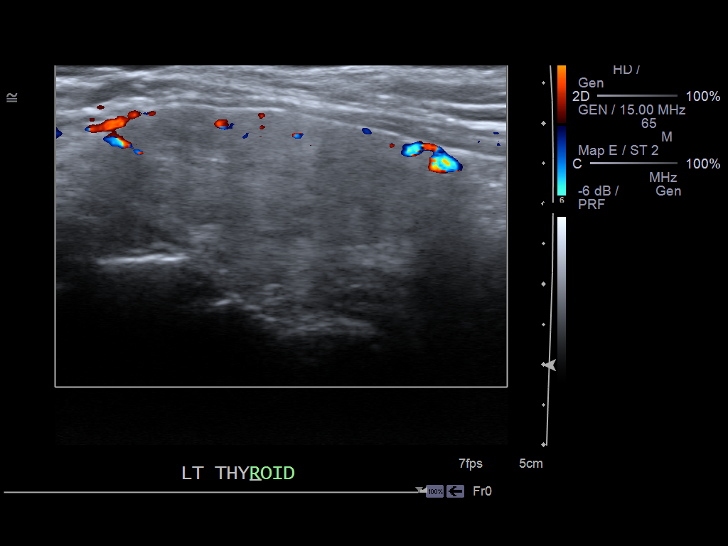
[im 41/45]
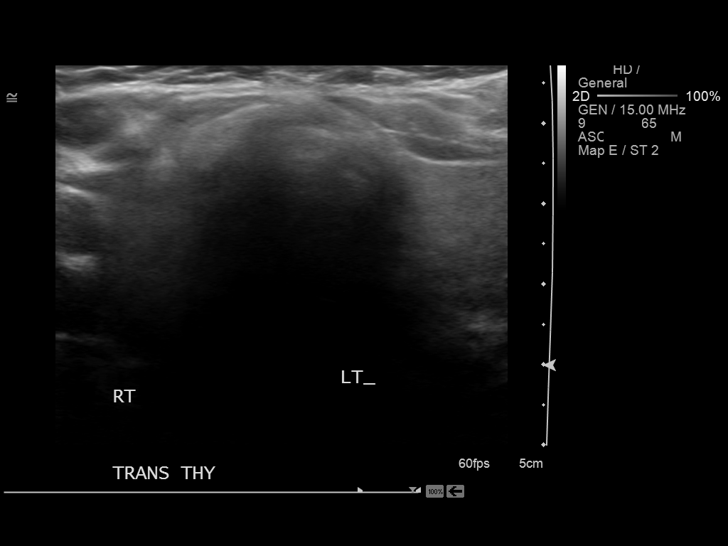
[im 45/45]
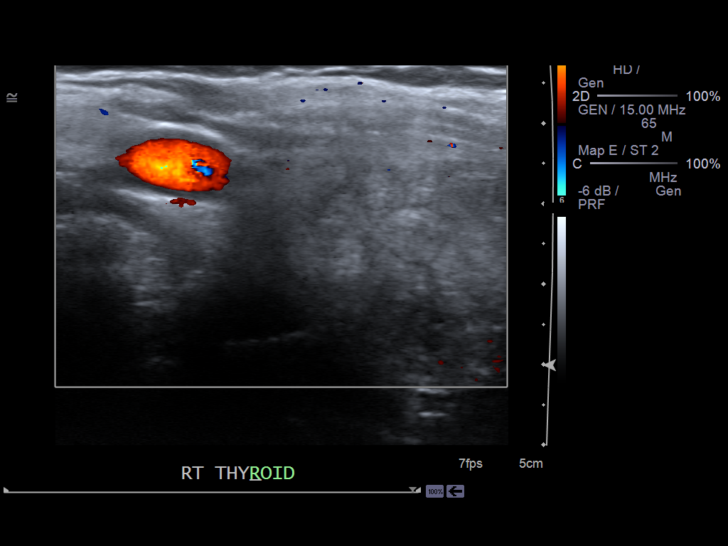

[14 of 25 positions shown; findings below may reference images not displayed]

FINDINGS: Right thyroid lobe

Measurements: 4.7 cm x 2.9 cm x 2.4 cm. Gland is heterogeneous in
echogenicity. There is a solid, heterogeneous, mostly isoechoic
nodule centered on the midpole extending to the lower pole measuring
3 cm x 3.3 cm x 2.7 cm.

Left thyroid lobe

Measurements: 4.1 cm x 1.9 cm x 1.5 cm. Gland is heterogeneous in
echogenicity. No discrete nodules visualized.

Isthmus

Thickness: 5.7 mm.  No nodules visualized.

Lymphadenopathy

None visualized.
IMPRESSION: 1. 3.3 cm solid nodule arises from the mid to lower pole of the
right lobe reflecting the abnormality seen on CT. No other discrete
nodules. Findings meet consensus criteria for biopsy.
Ultrasound-guided fine needle aspiration should be considered, as
per the consensus statement: Management of Thyroid Nodules Detected
at [place]: Society of Radiologists in Ultrasound Consensus Conference
2. Thyroid gland is heterogeneous in echogenicity. Gland is normal
in overall size.

## 2014-12-25 HISTORY — PX: HIP SURGERY: SHX245

## 2015-01-04 IMAGING — CR DG SHOULDER 3+V*R*
1 series · 3 of 3 positions shown · non-contrast
Comparison: None.

CLINICAL DATA: Pain post trauma

EXAM:
DG SHOULDER 3+ VIEWS RIGHT

[Series 1: grashey · 0.17mm/px · 3 of 3 slices shown]
[im 1/3]
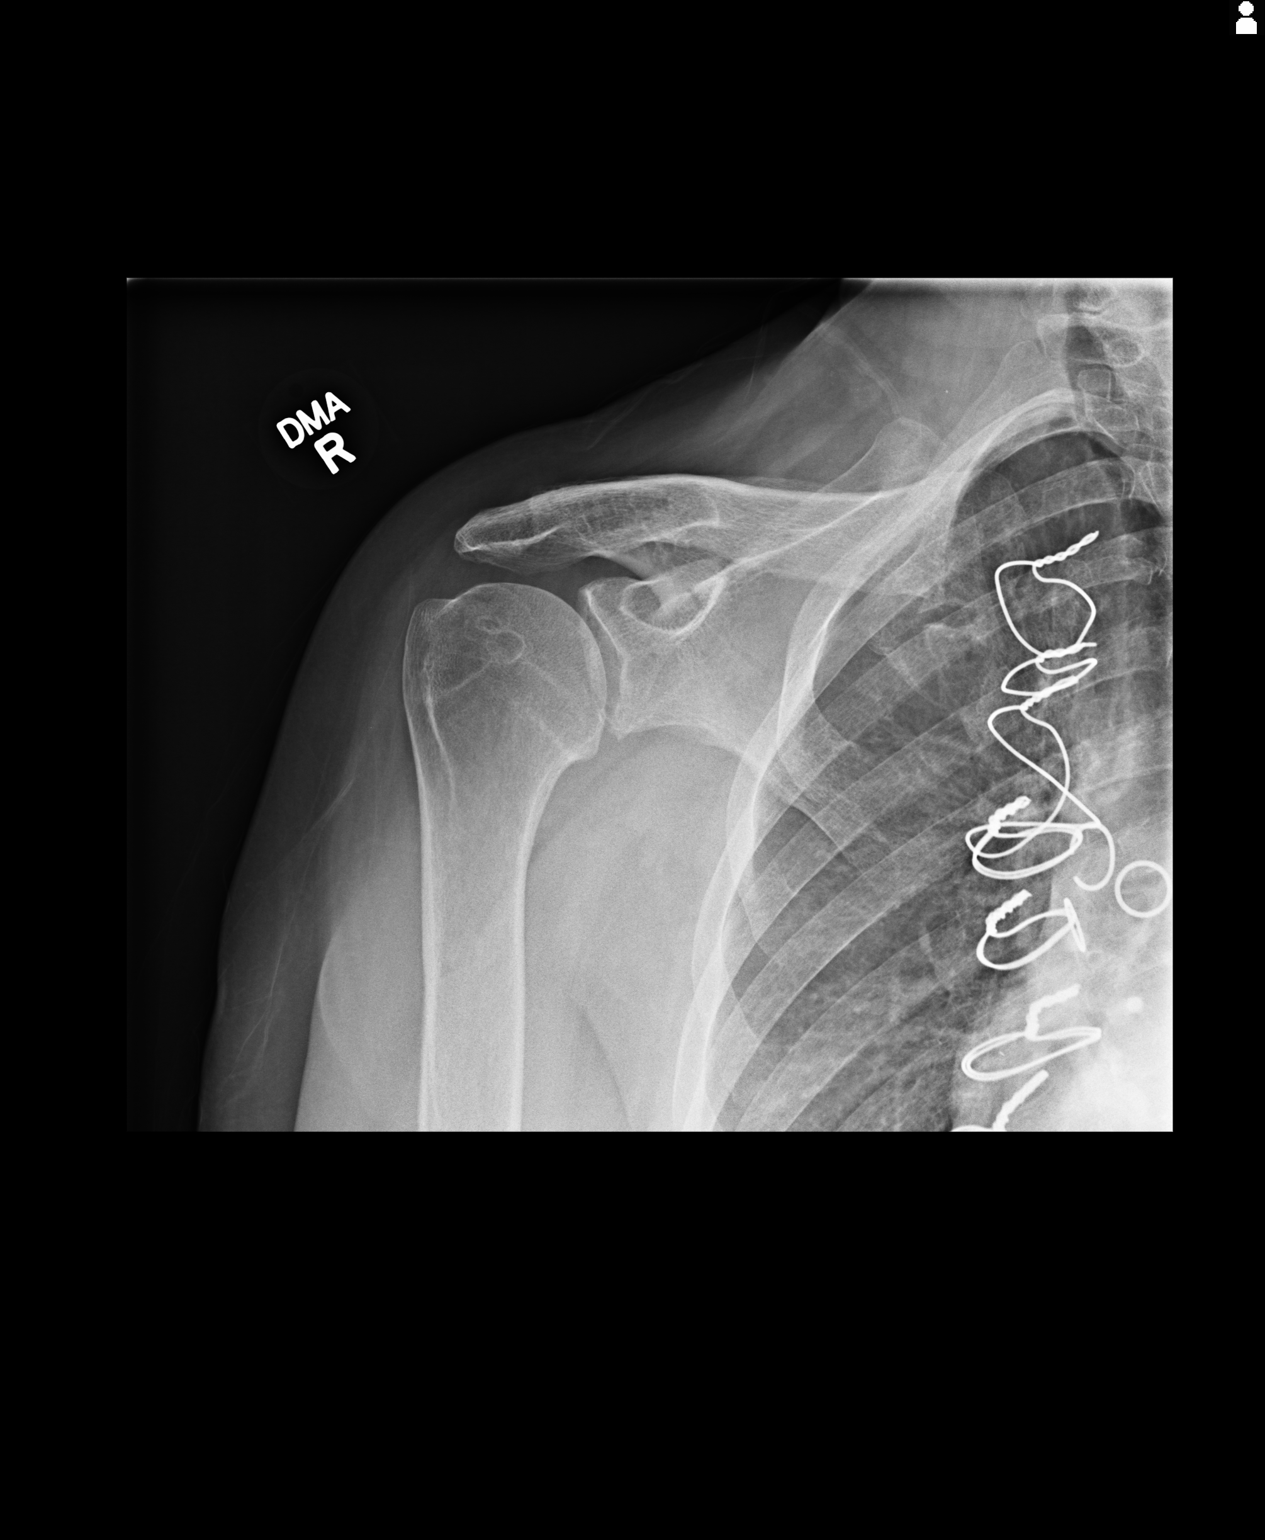
[im 2/3]
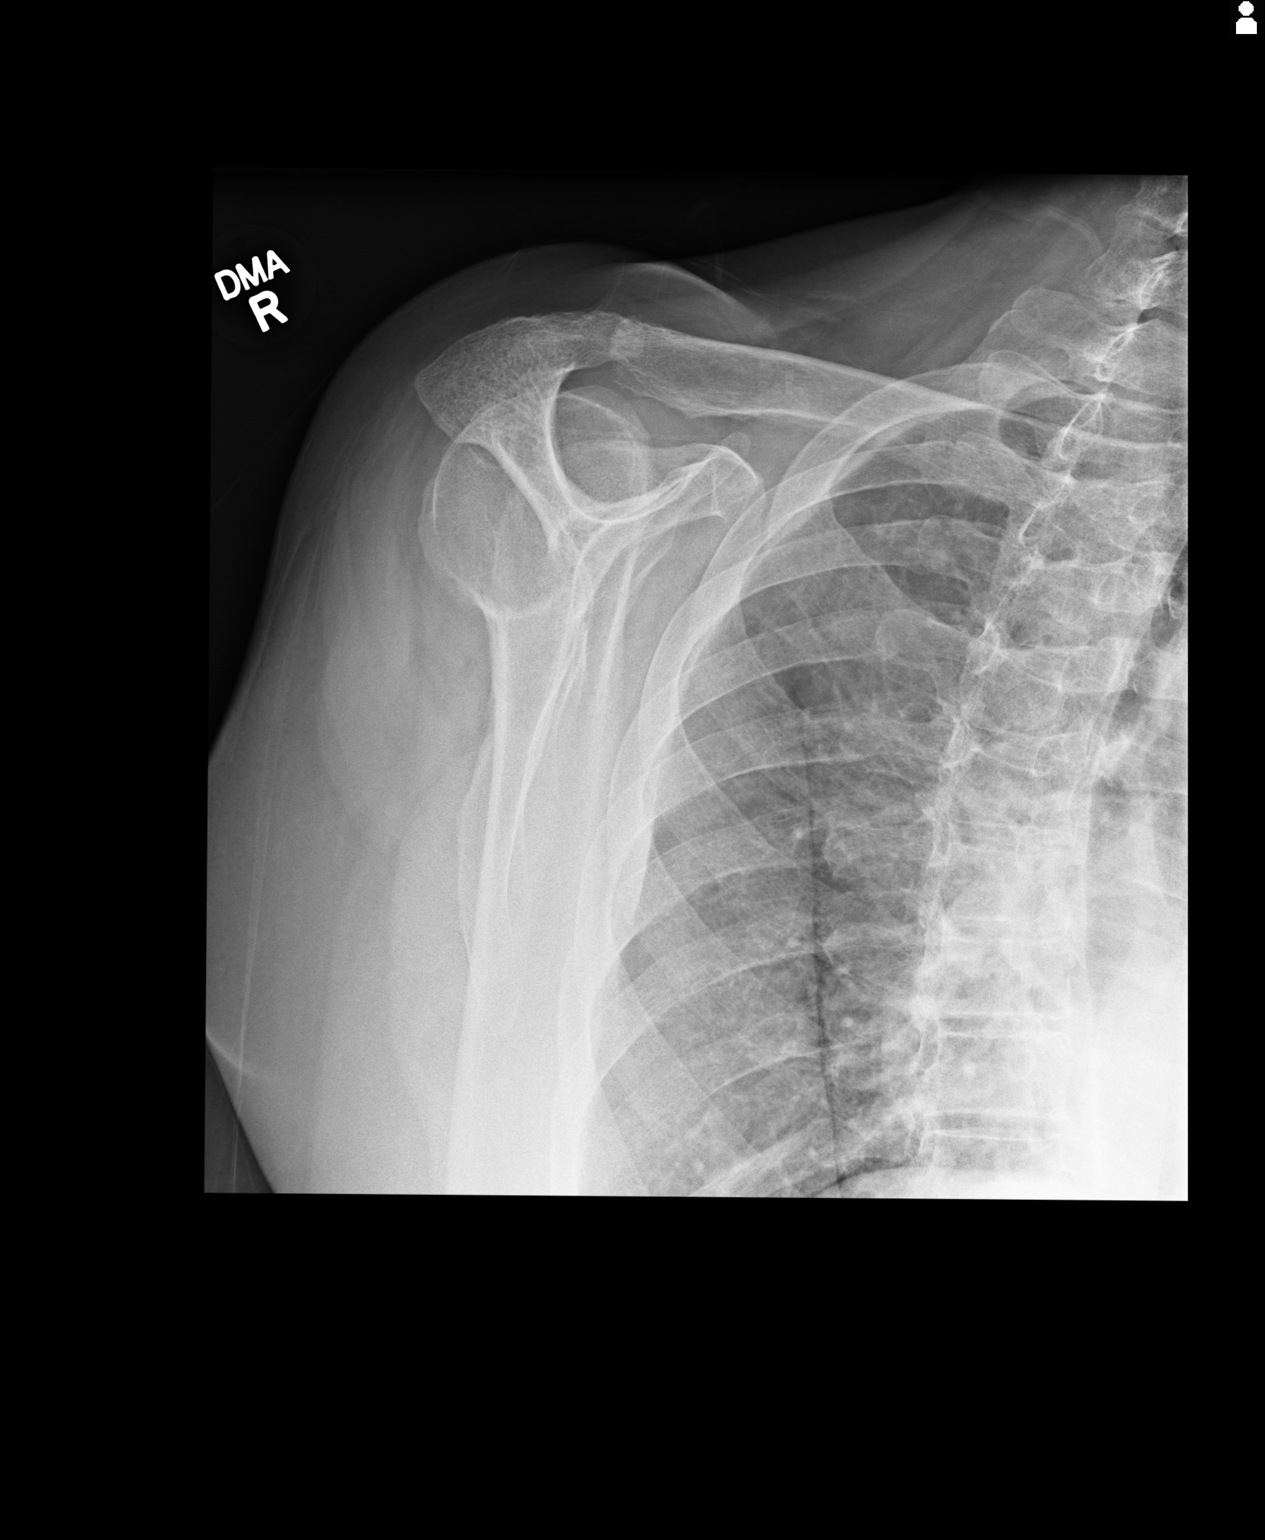
[im 3/3]
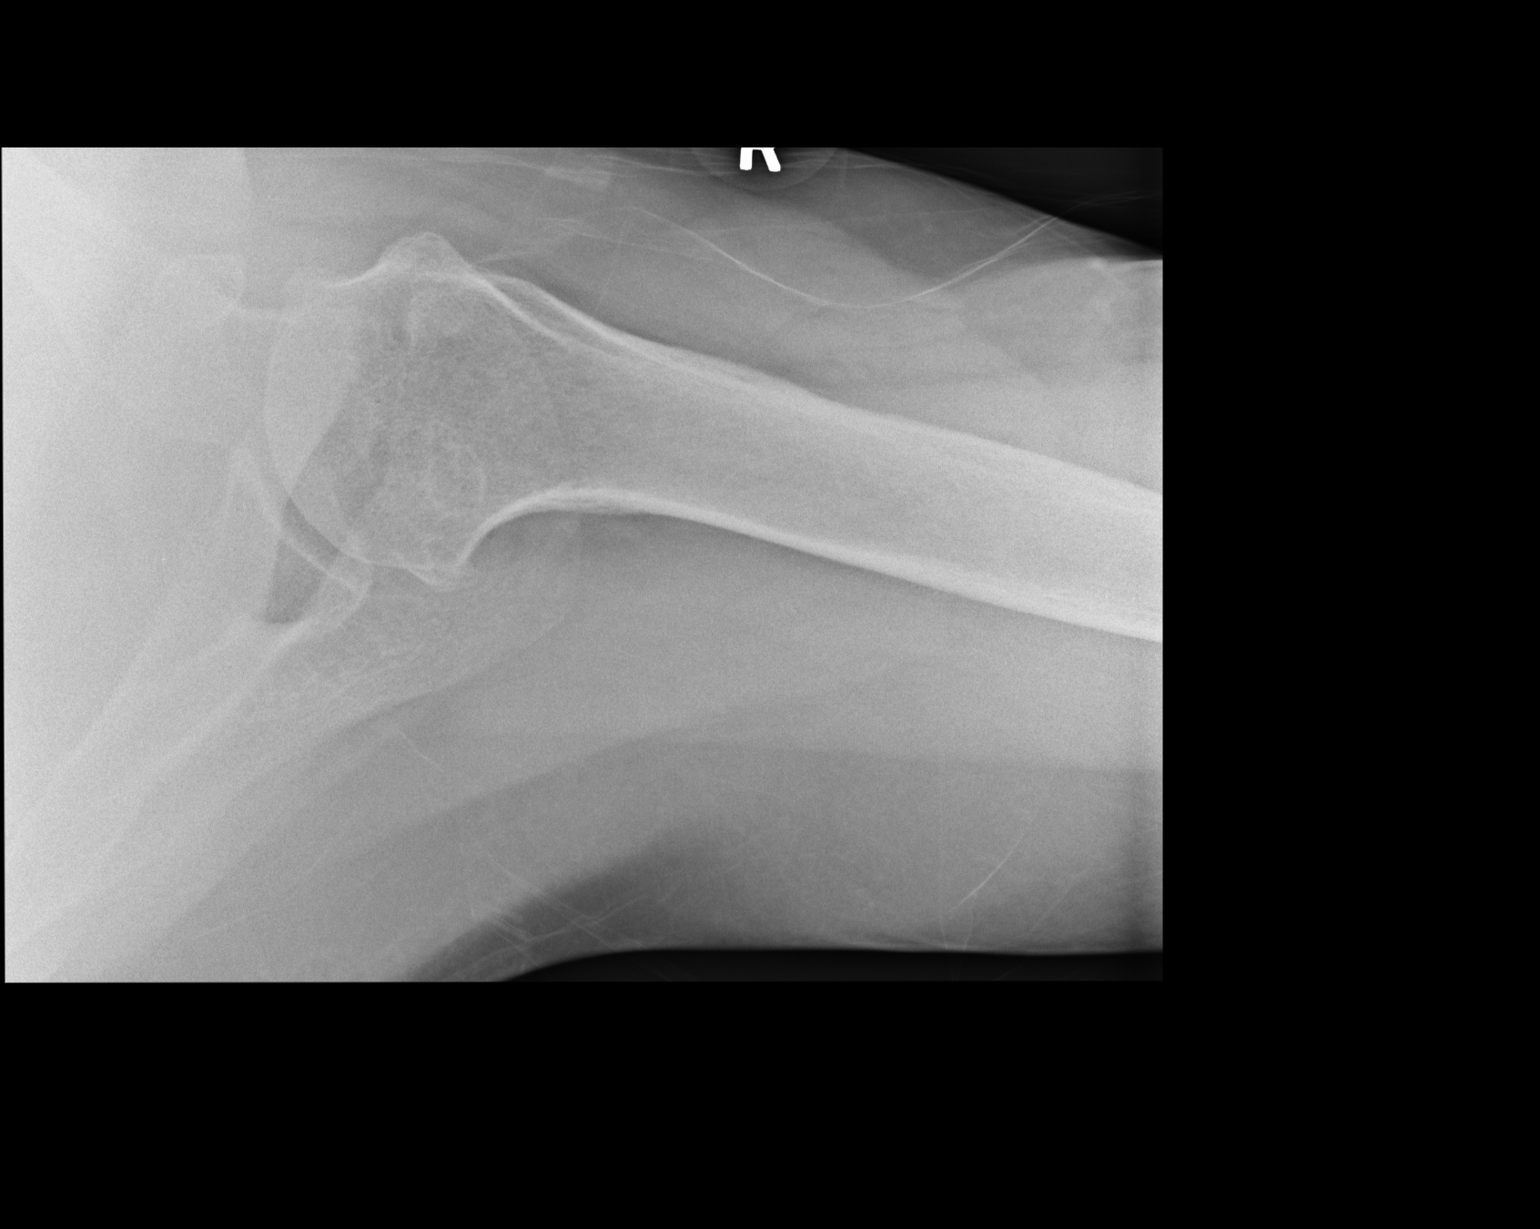

[3 of 3 positions shown; findings below may reference images not displayed]

FINDINGS: The frontal, Y scapular, and axillary views were obtained. There is
moderate generalized osteoarthritic change. No fracture or
dislocation. No erosive change.
IMPRESSION: Moderate generalized osteoarthritic change. No fracture or
dislocation.

## 2015-01-08 IMAGING — CR DG FOOT COMPLETE 3+V*L*
1 series · 3 of 3 positions shown · non-contrast
Comparison: None.

CLINICAL DATA: Pain first metatarsal and phalanges.

EXAM:
LEFT FOOT - COMPLETE 3+ VIEW

[Series 1: ap · 0.17mm/px · 3 of 3 slices shown]
[im 1/3]
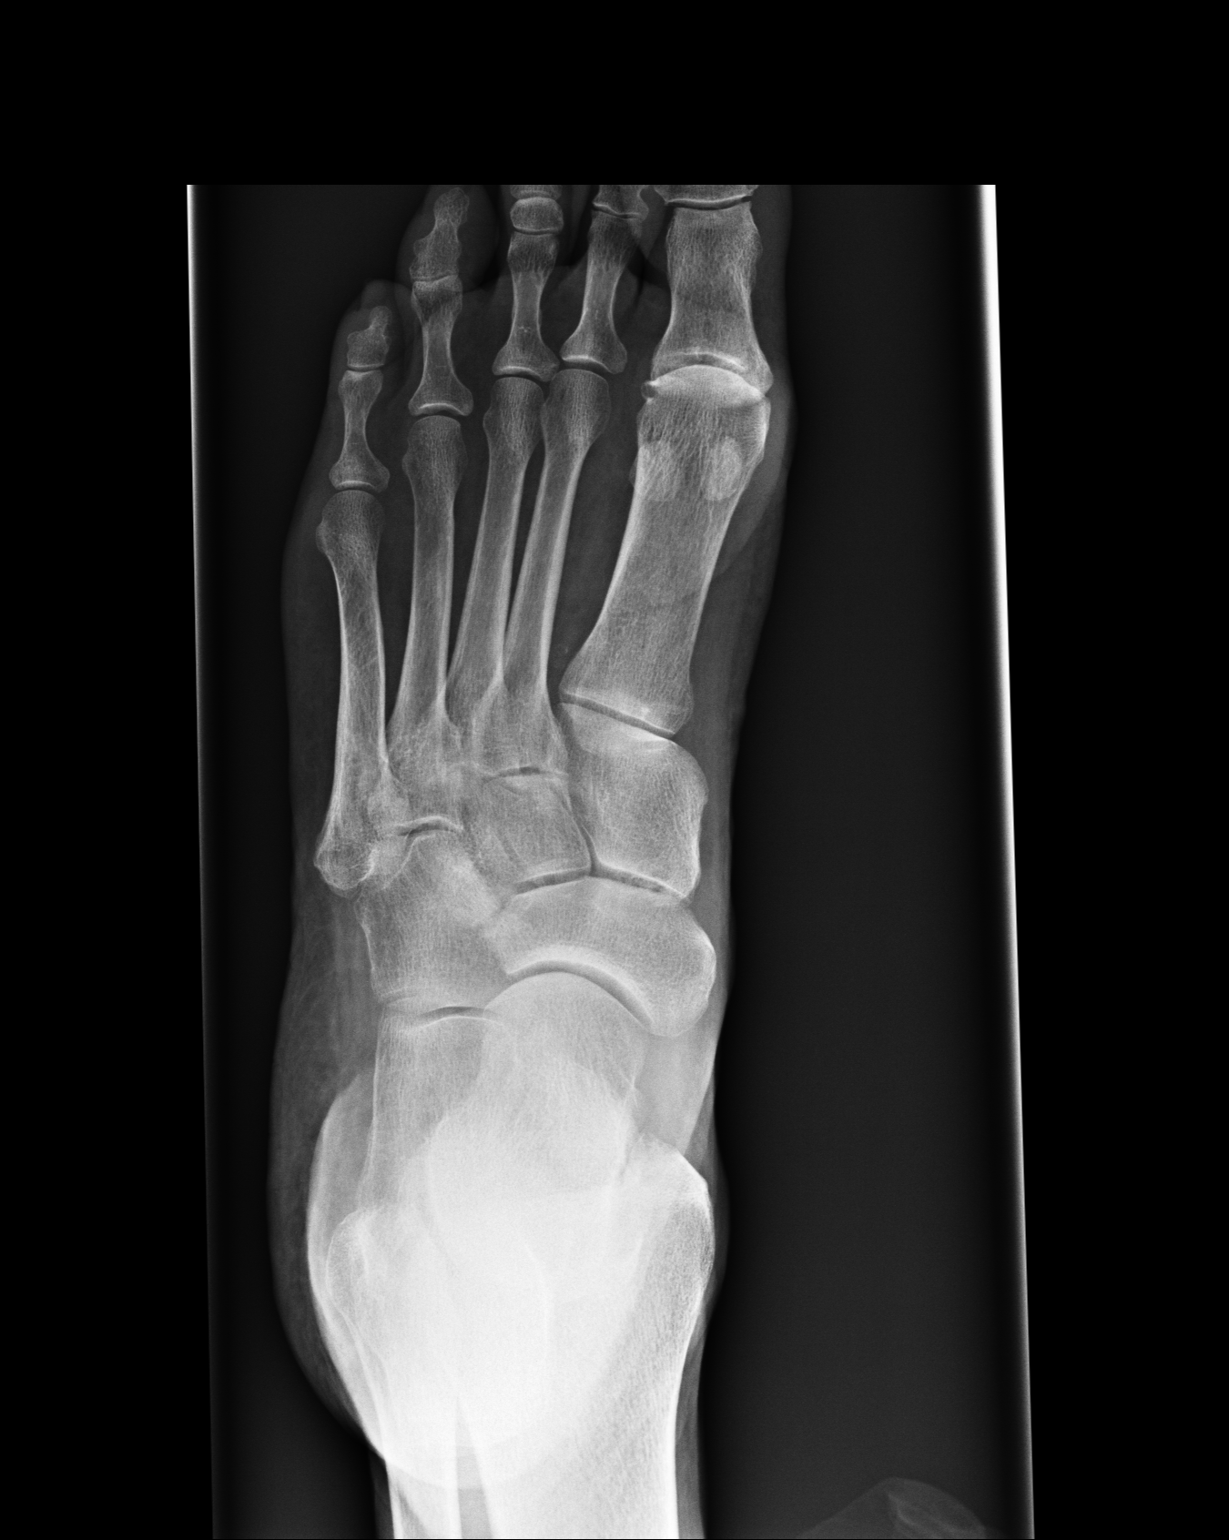
[im 2/3]
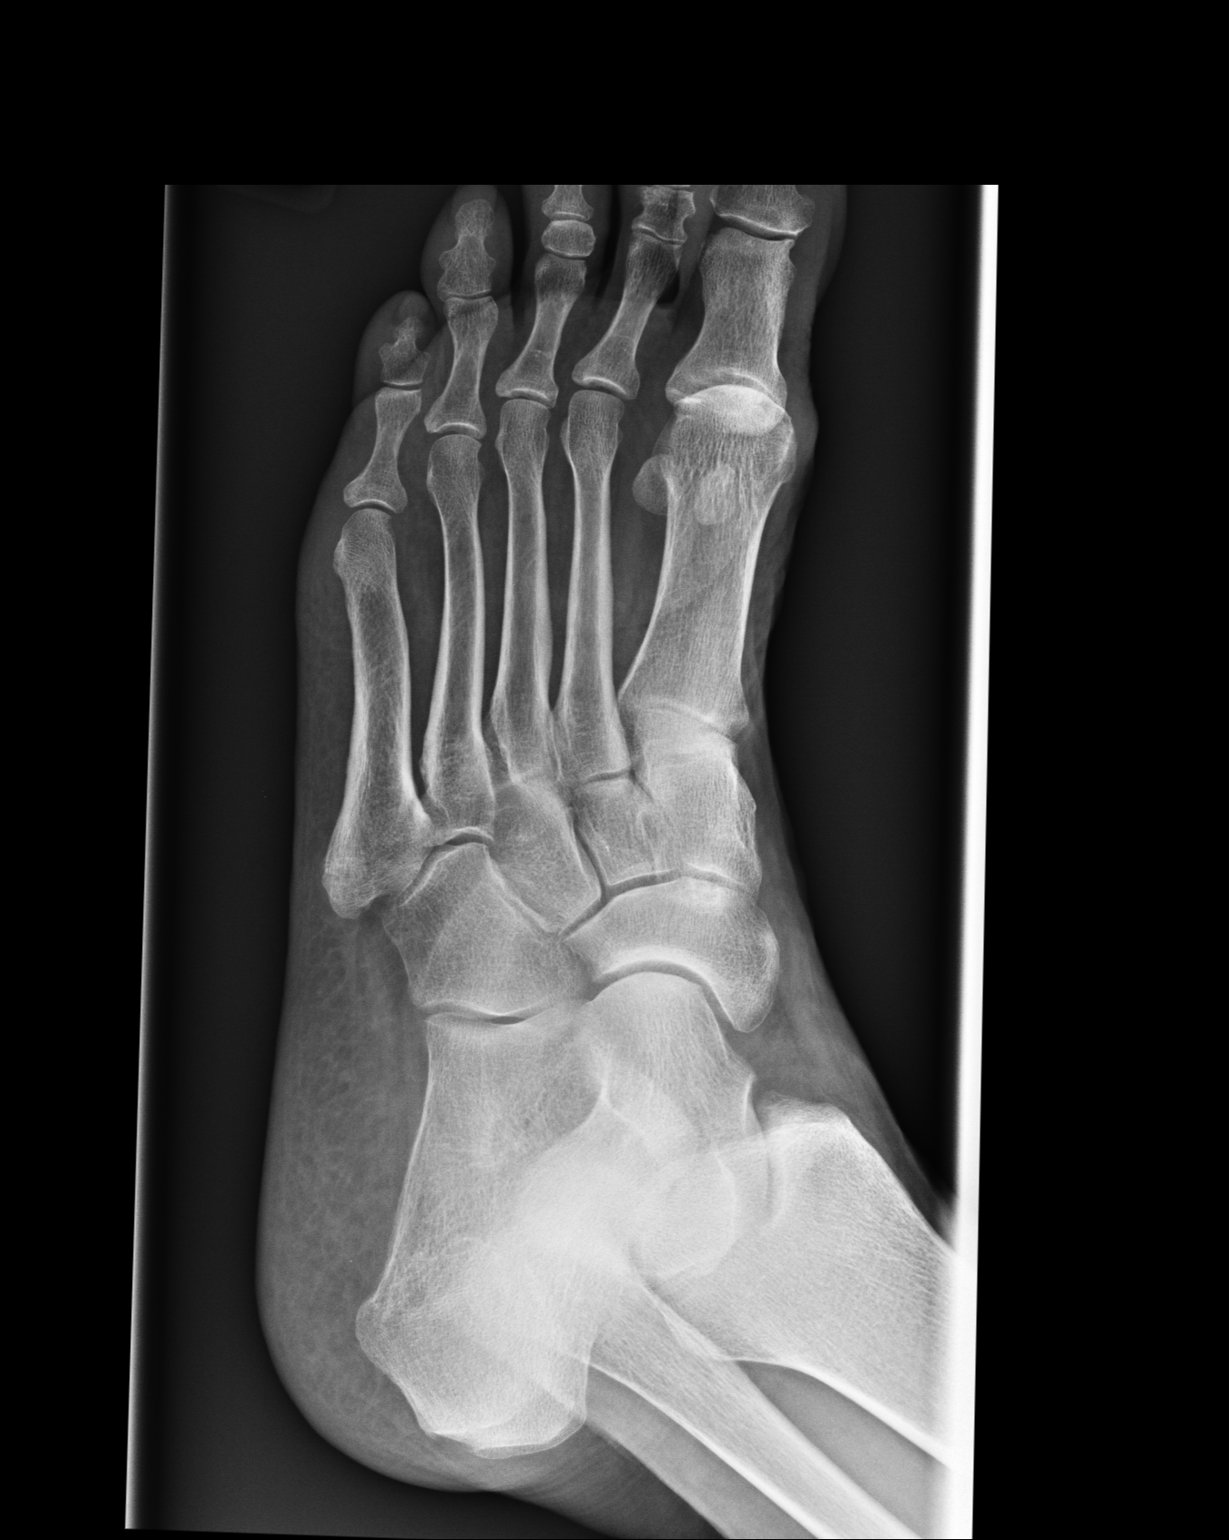
[im 3/3]
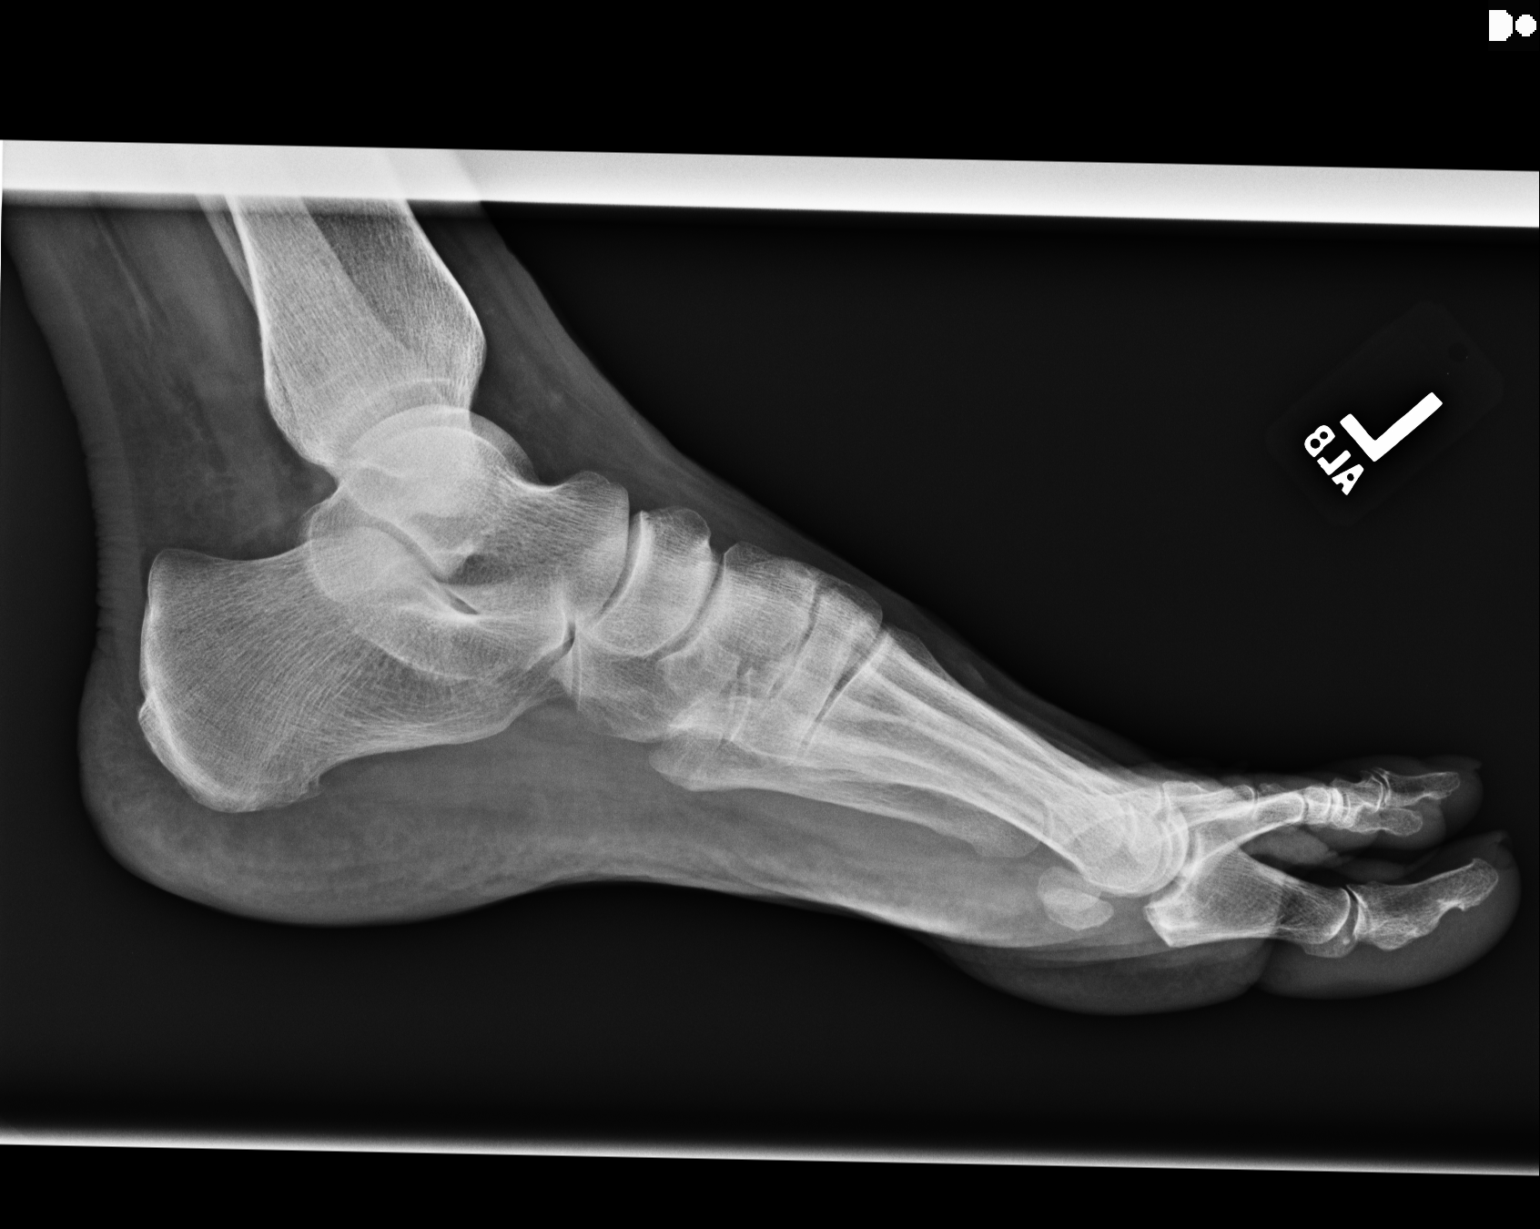

[3 of 3 positions shown; findings below may reference images not displayed]

FINDINGS: There are mild degenerative changes involving the first MTP joint.
There is no acute fracture or dislocation.
IMPRESSION: Degenerative changes first MTP joint.

## 2015-04-07 ENCOUNTER — Other Ambulatory Visit: Payer: Self-pay | Admitting: Family Medicine

## 2015-04-07 DIAGNOSIS — R4182 Altered mental status, unspecified: Secondary | ICD-10-CM

## 2015-04-20 ENCOUNTER — Emergency Department: Payer: Medicare HMO

## 2015-04-20 ENCOUNTER — Emergency Department
Admission: EM | Admit: 2015-04-20 | Discharge: 2015-04-20 | Disposition: A | Discharge status: Home / Self Care | Payer: Medicare HMO | Attending: Emergency Medicine | Admitting: Emergency Medicine

## 2015-04-20 DIAGNOSIS — Z79899 Other long term (current) drug therapy: Secondary | ICD-10-CM | POA: Insufficient documentation

## 2015-04-20 DIAGNOSIS — Z791 Long term (current) use of non-steroidal anti-inflammatories (NSAID): Secondary | ICD-10-CM | POA: Diagnosis not present

## 2015-04-20 DIAGNOSIS — Z951 Presence of aortocoronary bypass graft: Secondary | ICD-10-CM | POA: Diagnosis not present

## 2015-04-20 DIAGNOSIS — R601 Generalized edema: Secondary | ICD-10-CM | POA: Diagnosis not present

## 2015-04-20 DIAGNOSIS — I10 Essential (primary) hypertension: Secondary | ICD-10-CM | POA: Insufficient documentation

## 2015-04-20 DIAGNOSIS — Z87891 Personal history of nicotine dependence: Secondary | ICD-10-CM | POA: Diagnosis not present

## 2015-04-20 DIAGNOSIS — M79605 Pain in left leg: Secondary | ICD-10-CM

## 2015-04-20 DIAGNOSIS — E785 Hyperlipidemia, unspecified: Secondary | ICD-10-CM | POA: Insufficient documentation

## 2015-04-20 DIAGNOSIS — M79662 Pain in left lower leg: Secondary | ICD-10-CM | POA: Diagnosis not present

## 2015-04-20 DIAGNOSIS — R2242 Localized swelling, mass and lump, left lower limb: Secondary | ICD-10-CM | POA: Diagnosis present

## 2015-04-20 DIAGNOSIS — Z7982 Long term (current) use of aspirin: Secondary | ICD-10-CM | POA: Diagnosis not present

## 2015-04-20 DIAGNOSIS — K219 Gastro-esophageal reflux disease without esophagitis: Secondary | ICD-10-CM | POA: Diagnosis not present

## 2015-04-20 DIAGNOSIS — I2510 Atherosclerotic heart disease of native coronary artery without angina pectoris: Secondary | ICD-10-CM | POA: Insufficient documentation

## 2015-04-20 HISTORY — DX: Acute myocardial infarction, unspecified: I21.9

## 2015-04-20 LAB — CBC WITH DIFFERENTIAL/PLATELET
BASOS ABS: 0 10*3/uL (ref 0–0.1)
Basophils Relative: 1 %
Eosinophils Absolute: 0.1 10*3/uL (ref 0–0.7)
Eosinophils Relative: 5 %
HCT: 30.7 % — ABNORMAL LOW (ref 35.0–47.0)
Hemoglobin: 10 g/dL — ABNORMAL LOW (ref 12.0–16.0)
LYMPHS ABS: 1 10*3/uL (ref 1.0–3.6)
LYMPHS PCT: 35 %
MCH: 30.8 pg (ref 26.0–34.0)
MCHC: 32.6 g/dL (ref 32.0–36.0)
MCV: 94.4 fL (ref 80.0–100.0)
Monocytes Absolute: 0.6 10*3/uL (ref 0.2–0.9)
Monocytes Relative: 20 %
NEUTROS ABS: 1.1 10*3/uL — AB (ref 1.4–6.5)
Neutrophils Relative %: 39 %
Platelets: 190 10*3/uL (ref 150–440)
RBC: 3.25 MIL/uL — ABNORMAL LOW (ref 3.80–5.20)
RDW: 16.9 % — AB (ref 11.5–14.5)
WBC: 2.9 10*3/uL — AB (ref 3.6–11.0)

## 2015-04-20 LAB — COMPREHENSIVE METABOLIC PANEL
ALBUMIN: 3.2 g/dL — AB (ref 3.5–5.0)
ALT: 9 U/L — ABNORMAL LOW (ref 14–54)
ANION GAP: 4 — AB (ref 5–15)
AST: 15 U/L (ref 15–41)
Alkaline Phosphatase: 57 U/L (ref 38–126)
BILIRUBIN TOTAL: 0.7 mg/dL (ref 0.3–1.2)
BUN: 14 mg/dL (ref 6–20)
CHLORIDE: 106 mmol/L (ref 101–111)
CO2: 28 mmol/L (ref 22–32)
Calcium: 8.9 mg/dL (ref 8.9–10.3)
Creatinine, Ser: 0.98 mg/dL (ref 0.44–1.00)
GFR calc Af Amer: >60 mL/min (ref >60)
GFR, EST NON AFRICAN AMERICAN: 55 mL/min — AB (ref >60)
GLUCOSE: 82 mg/dL (ref 65–99)
POTASSIUM: 4.5 mmol/L (ref 3.5–5.1)
Sodium: 138 mmol/L (ref 135–145)
TOTAL PROTEIN: 6 g/dL — AB (ref 6.5–8.1)

## 2015-04-20 LAB — BRAIN NATRIURETIC PEPTIDE: B NATRIURETIC PEPTIDE 5: 669 pg/mL — AB (ref 0.0–100.0)

## 2015-04-20 LAB — TROPONIN I

## 2015-04-20 MED ORDER — OXYCODONE-ACETAMINOPHEN 5-325 MG PO TABS: 2.0000 | ORAL_TABLET | Freq: Four times a day (QID) | ORAL | Status: DC | PRN

## 2015-04-20 MED ORDER — FUROSEMIDE 10 MG/ML IJ SOLN
40.0000 mg | Freq: Once | INTRAMUSCULAR | Status: AC
  Administered 2015-04-20: 40 mg via INTRAVENOUS
  Filled 2015-04-20: qty 4

## 2015-04-20 MED ORDER — MORPHINE SULFATE (PF) 4 MG/ML IV SOLN
4.0000 mg | Freq: Once | INTRAVENOUS | Status: AC
  Administered 2015-04-20: 4 mg via INTRAVENOUS
  Filled 2015-04-20: qty 1

## 2015-04-20 NOTE — ED Provider Notes (Signed)
[facility] Emergency Department Provider Note     Time seen: ----------------------------------------- 1:04 PM on 04/20/2015 -----------------------------------------    I have reviewed the triage vital signs and the nursing notes.   HISTORY  Chief Complaint No chief complaint on file.    HPI Cynthia [name] is a 75 y.o. female brought the ER for leg swelling since Sunday. Patient reports her legs are normally swollen but usually not this bad. She presents with bilateral leg swelling, pain is 10/10 in the left lower extremity and left hip. She reportedly had a hip replacement on the left side in the past. Physical therapy was done at her nursing home yesterday, she was able to walk yesterday with assistance.   Past Medical History  Diagnosis Date  . Coronary artery disease   . Hx of CABG 2013  . Hypertension   . Hyperlipidemia   . GERD (gastroesophageal reflux disease)     Patient Active Problem List   Diagnosis Date Noted  . Chest pain 09/08/2014  . Dehydration 09/08/2014    Past Surgical History  Procedure Laterality Date  . Cardiac surgery      Allergies Penicillins  Social History Social History  Substance Use Topics  . Smoking status: Former Smoker -- 0.50 packs/day for 35 years    Types: Cigarettes  . Smokeless tobacco: Never Used  . Alcohol Use: No    Review of Systems Constitutional: Negative for fever. Eyes: Negative for visual changes. ENT: Negative for sore throat. Cardiovascular: Negative for chest pain. Respiratory: Negative for shortness of breath. Gastrointestinal: Negative for abdominal pain, vomiting and diarrhea. Genitourinary: Negative for dysuria. Musculoskeletal: Positive for severe left leg pain, bilateral leg swelling Skin: Negative for rash. Neurological: Negative for headaches, focal weakness or numbness.  10-point ROS otherwise  negative.  ____________________________________________   PHYSICAL EXAM:  VITAL SIGNS: ED Triage Vitals  Enc Vitals Group     BP --      Pulse --      Resp --      Temp --      Temp src --      SpO2 --      Weight --      Height --      Head Cir --      Peak Flow --      Pain Score --      Pain Loc --      Pain Edu? --      Excl. in GC? --     Constitutional: Alert and oriented. Well appearing and in no distress. Eyes: Conjunctivae are normal. PERRL. Normal extraocular movements. ENT   Head: Normocephalic and atraumatic.   Nose: No congestion/rhinnorhea.   Mouth/Throat: Mucous membranes are moist.   Neck: No stridor. Cardiovascular: Normal rate, regular rhythm. Normal and symmetric distal pulses are present in all extremities. No murmurs, rubs, or gallops. Respiratory: Normal respiratory effort without tachypnea nor retractions. Breath sounds are clear and equal bilaterally. No wheezes/rales/rhonchi. Gastrointestinal: Soft and nontender. No distention. No abdominal bruits.  Musculoskeletal: Pain with range of motion of the lower extremity is, bilateral pitting edema to the hips, left lower extremity appears elongated compared to the right. Some erythema is noted on bilateral lower extremities. Neurologic:  Normal speech and language. No gross focal neurologic deficits are appreciated.  Skin:  Skin is warm, dry and intact. Mild erythema is noted on the legs Psychiatric: Mood and affect are normal. Speech and behavior are normal. Patient exhibits appropriate  insight and judgment. ____________________________________________  EKG: Interpreted by me. Sinus rhythm, low voltage, normal axis, right bundle branch block, long QT, wide QRS.  ____________________________________________  ED COURSE:  Pertinent labs & imaging results that were available during my care of the patient were reviewed by me and considered in my medical decision making (see chart for  details). Unclear etiology for symptoms. We will check basic labs, x-rays and reevaluate. ____________________________________________    LABS (pertinent positives/negatives)  Labs Reviewed  CBC WITH DIFFERENTIAL/PLATELET - Abnormal; Notable for the following:    WBC 2.9 (*)    RBC 3.25 (*)    Hemoglobin 10.0 (*)    HCT 30.7 (*)    RDW 16.9 (*)    Neutro Abs 1.1 (*)    All other components within normal limits  BRAIN NATRIURETIC PEPTIDE - Abnormal; Notable for the following:    B Natriuretic Peptide 669.0 (*)    All other components within normal limits  COMPREHENSIVE METABOLIC PANEL - Abnormal; Notable for the following:    Total Protein 6.0 (*)    Albumin 3.2 (*)    ALT 9 (*)    GFR calc non Af Amer 55 (*)    Anion gap 4 (*)    All other components within normal limits  TROPONIN I    RADIOLOGY Images were viewed by me  Venous Doppler, pelvis x-rays IMPRESSION: No acute intracranial abnormality. Stable exam. IMPRESSION: No evidence of deep venous thrombosis. IMPRESSION: No acute osseous findings status post left total hip arthroplasty ; femoral prosthesis incompletely visualized. Possible generalized soft tissue edema. ____________________________________________  FINAL ASSESSMENT AND PLAN  Edema, left leg pain  Plan: Patient with labs and imaging as dictated above. Edema is likely secondary to congestive heart failure or as the family notes she recently had her lasix dose stopped. Blood pressure is normal on the left leg.  Her labs are otherwise grossly unremarkable. She is stable for outpatient follow-up with her doctor.   Emily FilbertWilliams, Tianne Plott E, MD   Emily FilbertJonathan E Rigo Letts, MD 04/20/15 [phone]

## 2015-04-20 NOTE — Discharge Instructions (Signed)
Edema °Edema is an abnormal buildup of fluids in your body tissues. Edema is somewhat dependent on gravity to pull the fluid to the lowest place in your body. That makes the condition more common in the legs and thighs (lower extremities). Painless swelling of the feet and ankles is common and becomes more likely as you get older. It is also common in looser tissues, like around your eyes.  °When the affected area is squeezed, the fluid may move out of that spot and leave a dent for a few moments. This dent is called pitting.  °CAUSES  °There are many possible causes of edema. Eating too much salt and being on your feet or sitting for a long time can cause edema in your legs and ankles. Hot weather may make edema worse. Common medical causes of edema include: °· Heart failure. °· Liver disease. °· Kidney disease. °· Weak blood vessels in your legs. °· Cancer. °· An injury. °· Pregnancy. °· Some medications. °· Obesity.  °SYMPTOMS  °Edema is usually painless. Your skin may look swollen or shiny.  °DIAGNOSIS  °Your health care provider may be able to diagnose edema by asking about your medical history and doing a physical exam. You may need to have tests such as X-rays, an electrocardiogram, or blood tests to check for medical conditions that may cause edema.  °TREATMENT  °Edema treatment depends on the cause. If you have heart, liver, or kidney disease, you need the treatment appropriate for these conditions. General treatment may include: °· Elevation of the affected body part above the level of your heart. °· Compression of the affected body part. Pressure from elastic bandages or support stockings squeezes the tissues and forces fluid back into the blood vessels. This keeps fluid from entering the tissues. °· Restriction of fluid and salt intake. °· Use of a water pill (diuretic). These medications are appropriate only for some types of edema. They pull fluid out of your body and make you urinate more often. This  gets rid of fluid and reduces swelling, but diuretics can have side effects. Only use diuretics as directed by your health care provider. °HOME CARE INSTRUCTIONS  °· Keep the affected body part above the level of your heart when you are lying down.   °· Do not sit still or stand for prolonged periods.   °· Do not put anything directly under your knees when lying down. °· Do not wear constricting clothing or garters on your upper legs.   °· Exercise your legs to work the fluid back into your blood vessels. This may help the swelling go down.   °· Wear elastic bandages or support stockings to reduce ankle swelling as directed by your health care provider.   °· Eat a low-salt diet to reduce fluid if your health care provider recommends it.   °· Only take medicines as directed by your health care provider.  °SEEK MEDICAL CARE IF:  °· Your edema is not responding to treatment. °· You have heart, liver, or kidney disease and notice symptoms of edema. °· You have edema in your legs that does not improve after elevating them.   °· You have sudden and unexplained weight gain. °SEEK IMMEDIATE MEDICAL CARE IF:  °· You develop shortness of breath or chest pain.   °· You cannot breathe when you lie down. °· You develop pain, redness, or warmth in the swollen areas.   °· You have heart, liver, or kidney disease and suddenly get edema. °· You have a fever and your symptoms suddenly get worse. °MAKE SURE YOU:  °·   Understand these instructions. °· Will watch your condition. °· Will get help right away if you are not doing well or get worse. °  °This information is not intended to replace advice given to you by your health care provider. Make sure you discuss any questions you have with your health care provider. °  °Document Released: 01/10/2005 Document Revised: 01/31/2014 Document Reviewed: 11/02/2012 °Elsevier Interactive Patient Education ©2016 [employer] ° °Musculoskeletal Pain °Musculoskeletal pain is muscle and boney aches  and pains. These pains can occur in any part of the body. Your caregiver may treat you without knowing the cause of the pain. They may treat you if blood or urine tests, X-rays, and other tests were normal.  °CAUSES °There is often not a definite cause or reason for these pains. These pains may be caused by a type of germ (virus). The discomfort may also come from overuse. Overuse includes working out too hard when your body is not fit. Boney aches also come from weather changes. Bone is sensitive to atmospheric pressure changes. °HOME CARE INSTRUCTIONS  °· Ask when your test results will be ready. Make sure you get your test results. °· Only take over-the-counter or prescription medicines for pain, discomfort, or fever as directed by your caregiver. If you were given medications for your condition, do not drive, operate machinery or power tools, or sign legal documents for 24 hours. Do not drink alcohol. Do not take sleeping pills or other medications that may interfere with treatment. °· Continue all activities unless the activities cause more pain. When the pain lessens, slowly resume normal activities. Gradually increase the intensity and duration of the activities or exercise. °· During periods of severe pain, bed rest may be helpful. Lay or sit in any position that is comfortable. °· Putting ice on the injured area. °¨ Put ice in a bag. °¨ Place a towel between your skin and the bag. °¨ Leave the ice on for 15 to 20 minutes, 3 to 4 times a day. °· Follow up with your caregiver for continued problems and no reason can be found for the pain. If the pain becomes worse or does not go away, it may be necessary to repeat tests or do additional testing. Your caregiver may need to look further for a possible cause. °SEEK IMMEDIATE MEDICAL CARE IF: °· You have pain that is getting worse and is not relieved by medications. °· You develop chest pain that is associated with shortness or breath, sweating, feeling sick to  your stomach (nauseous), or throw up (vomit). °· Your pain becomes localized to the abdomen. °· You develop any new symptoms that seem different or that concern you. °MAKE SURE YOU:  °· Understand these instructions. °· Will watch your condition. °· Will get help right away if you are not doing well or get worse. °  °This information is not intended to replace advice given to you by your health care provider. Make sure you discuss any questions you have with your health care provider. °  °Document Released: 01/10/2005 Document Revised: 04/04/2011 Document Reviewed: 09/14/2012 °Elsevier Interactive Patient Education ©2016 [employer] ° °

## 2015-04-20 NOTE — ED Notes (Signed)
EMS called to transport patient back to nursing home

## 2015-04-20 NOTE — ED Notes (Signed)
Pt reports leg swelling since Sunday. Pt reports legs are normally this swollen but not this bad. Pt has bilateral leg swelling, warm to touch. Pt reports pain 10/10

## 2015-04-20 NOTE — ED Notes (Signed)
EMS arrived. Pt being transported back to nursing home.

## 2015-04-29 ENCOUNTER — Ambulatory Visit: Admission: RE | Admit: 2015-04-29 | Payer: Medicare HMO | Source: Ambulatory Visit

## 2015-06-10 ENCOUNTER — Emergency Department: Payer: Medicare HMO

## 2015-06-10 ENCOUNTER — Encounter: Payer: Self-pay | Admitting: Emergency Medicine

## 2015-06-10 ENCOUNTER — Emergency Department
Admission: EM | Admit: 2015-06-10 | Discharge: 2015-06-10 | Disposition: A | Discharge status: Home / Self Care | Payer: Medicare HMO | Attending: Emergency Medicine | Admitting: Emergency Medicine

## 2015-06-10 DIAGNOSIS — E785 Hyperlipidemia, unspecified: Secondary | ICD-10-CM | POA: Diagnosis not present

## 2015-06-10 DIAGNOSIS — Z7982 Long term (current) use of aspirin: Secondary | ICD-10-CM | POA: Insufficient documentation

## 2015-06-10 DIAGNOSIS — Z79899 Other long term (current) drug therapy: Secondary | ICD-10-CM | POA: Diagnosis not present

## 2015-06-10 DIAGNOSIS — Z87891 Personal history of nicotine dependence: Secondary | ICD-10-CM | POA: Insufficient documentation

## 2015-06-10 DIAGNOSIS — I2510 Atherosclerotic heart disease of native coronary artery without angina pectoris: Secondary | ICD-10-CM | POA: Diagnosis not present

## 2015-06-10 DIAGNOSIS — Z951 Presence of aortocoronary bypass graft: Secondary | ICD-10-CM | POA: Insufficient documentation

## 2015-06-10 DIAGNOSIS — R079 Chest pain, unspecified: Secondary | ICD-10-CM

## 2015-06-10 DIAGNOSIS — I10 Essential (primary) hypertension: Secondary | ICD-10-CM | POA: Diagnosis not present

## 2015-06-10 HISTORY — DX: Acute embolism and thrombosis of unspecified vein: I82.90

## 2015-06-10 LAB — BASIC METABOLIC PANEL
Anion gap: 5 (ref 5–15)
BUN: 21 mg/dL — AB (ref 6–20)
CALCIUM: 9.4 mg/dL (ref 8.9–10.3)
CO2: 29 mmol/L (ref 22–32)
CREATININE: 1.1 mg/dL — AB (ref 0.44–1.00)
Chloride: 103 mmol/L (ref 101–111)
GFR calc Af Amer: 56 mL/min — ABNORMAL LOW (ref >60)
GFR, EST NON AFRICAN AMERICAN: 48 mL/min — AB (ref >60)
Glucose, Bld: 86 mg/dL (ref 65–99)
Potassium: 3.9 mmol/L (ref 3.5–5.1)
SODIUM: 137 mmol/L (ref 135–145)

## 2015-06-10 LAB — TROPONIN I: Troponin I: <0.03 ng/mL (ref <0.031)

## 2015-06-10 LAB — CBC
HCT: 34.7 % — ABNORMAL LOW (ref 35.0–47.0)
Hemoglobin: 11.3 g/dL — ABNORMAL LOW (ref 12.0–16.0)
MCH: 31.3 pg (ref 26.0–34.0)
MCHC: 32.7 g/dL (ref 32.0–36.0)
MCV: 95.6 fL (ref 80.0–100.0)
PLATELETS: 173 10*3/uL (ref 150–440)
RBC: 3.62 MIL/uL — ABNORMAL LOW (ref 3.80–5.20)
RDW: 15.4 % — AB (ref 11.5–14.5)
WBC: 2.9 10*3/uL — AB (ref 3.6–11.0)

## 2015-06-10 MED ORDER — MORPHINE SULFATE (PF) 4 MG/ML IV SOLN
4.0000 mg | Freq: Once | INTRAVENOUS | Status: AC
  Administered 2015-06-10: 4 mg via INTRAVENOUS
  Filled 2015-06-10: qty 1

## 2015-06-10 MED ORDER — IOPAMIDOL (ISOVUE-370) INJECTION 76%
100.0000 mL | Freq: Once | INTRAVENOUS | Status: AC | PRN
Start: 2015-06-10 — End: 2015-06-10
  Administered 2015-06-10: 100 mL via INTRAVENOUS

## 2015-06-10 MED ORDER — MORPHINE SULFATE (PF) 2 MG/ML IV SOLN: 2.0000 mg | Freq: Once | INTRAVENOUS | Status: DC

## 2015-06-10 MED ORDER — SODIUM CHLORIDE 0.9 % IV BOLUS (SEPSIS)
500.0000 mL | Freq: Once | INTRAVENOUS | Status: AC
  Administered 2015-06-10: 500 mL via INTRAVENOUS

## 2015-06-10 MED ORDER — RANITIDINE HCL 150 MG PO TABS: 150.0000 mg | ORAL_TABLET | Freq: Every day | ORAL | Status: DC

## 2015-06-10 NOTE — ED Provider Notes (Signed)
[facility] Emergency Department Provider Note   ____________________________________________  Time seen: Seen upon arrival to the emergency department  I have reviewed the triage vital signs and the nursing notes.   HISTORY  Chief Complaint Chest Pain   HPI Cynthia [name] is a 75 y.o. female with a history of coronary artery disease status post CABG who is presenting to the emergency department left-sided chest pain that started all of a sudden at 9 AM. She said the chest pain was a 10 out of 10 at the time and on the left side of the chest radiating into her left axilla. She says it is not associated with any shortness of breath, nausea or vomiting. Has taken aspirin as well as Plavix this morning. Was not given any medicine for pain with EMS because she told them it was a 4 out of 10 chest pain. Says that it does not hurt worse with deep breathing.Says at this time it has returned to a 7 or 8 out of 10. Patient also had a recent left-sided hip replacement and is in rehabilitation.    Past Medical History  Diagnosis Date  . Coronary artery disease   . Hx of CABG 2013  . Hypertension   . Hyperlipidemia   . GERD (gastroesophageal reflux disease)   . Heart attack (HCC)   . Blood clot in vein     Patient Active Problem List   Diagnosis Date Noted  . Chest pain 09/08/2014  . Dehydration 09/08/2014    Past Surgical History  Procedure Laterality Date  . Cardiac surgery      Current Outpatient Rx  Name  Route  Sig  Dispense  Refill  . acetaminophen (TYLENOL) 500 MG tablet   Oral   Take 1,000 mg by mouth every 6 (six) hours as needed (pain).         Marland Kitchen albuterol (PROVENTIL) (2.5 MG/3ML) 0.083% nebulizer solution   Nebulization   Take 2.5 mg by nebulization every 4 (four) hours as needed for wheezing or shortness of breath.         Marland Kitchen aspirin EC 81 MG tablet   Oral   Take 81 mg by mouth daily.         . clopidogrel (PLAVIX) 75 MG  tablet   Oral   Take 75 mg by mouth daily.         Marland Kitchen docusate sodium (COLACE) 100 MG capsule   Oral   Take 100 mg by mouth 2 (two) times daily as needed for mild constipation.         . DULoxetine (CYMBALTA) 20 MG capsule   Oral   Take 20 mg by mouth daily.         . enalapril (VASOTEC) 2.5 MG tablet   Oral   Take 2.5 mg by mouth 2 (two) times daily.         . ferrous sulfate 325 (65 FE) MG tablet   Oral   Take 325 mg by mouth 2 (two) times daily.         . furosemide (LASIX) 40 MG tablet   Oral   Take 40 mg by mouth daily as needed for edema.         . hydrALAZINE (APRESOLINE) 25 MG tablet   Oral   Take 25 mg by mouth daily.          . isosorbide mononitrate (IMDUR) 30 MG 24 hr tablet   Oral   Take  30 mg by mouth daily.         Marland Kitchen. latanoprost (XALATAN) 0.005 % ophthalmic solution   Both Eyes   Place 1 drop into both eyes at bedtime.         . metoprolol tartrate (LOPRESSOR) 25 MG tablet   Oral   Take 12.5 mg by mouth 2 (two) times daily.         . ondansetron (ZOFRAN) 4 MG tablet   Oral   Take 4 mg by mouth 3 (three) times daily as needed for nausea or vomiting.         Marland Kitchen. oxyCODONE-acetaminophen (PERCOCET) 5-325 MG tablet   Oral   Take 2 tablets by mouth every 6 (six) hours as needed for moderate pain or severe pain.   30 tablet   0   . pantoprazole (PROTONIX) 40 MG tablet   Oral   Take 40 mg by mouth daily.          . pravastatin (PRAVACHOL) 20 MG tablet   Oral   Take 20 mg by mouth at bedtime.           Allergies Penicillins  No family history on file.  Social History Social History  Substance Use Topics  . Smoking status: Former Smoker -- 0.50 packs/day for 35 years    Types: Cigarettes  . Smokeless tobacco: Never Used  . Alcohol Use: No    Review of Systems Constitutional: No fever/chills Eyes: No visual changes. ENT: No sore throat. Cardiovascular:As above Respiratory: Denies shortness of  breath. Gastrointestinal: No abdominal pain.  No nausea, no vomiting.  No diarrhea.  No constipation. Genitourinary: Negative for dysuria. Musculoskeletal: Negative for back pain. Skin: Negative for rash. Neurological: Negative for headaches, focal weakness or numbness.  10-point ROS otherwise negative.  ____________________________________________   PHYSICAL EXAM:  VITAL SIGNS: ED Triage Vitals  Enc Vitals Group     BP 06/10/15 1408 118/72 mmHg     Pulse Rate 06/10/15 1408 62     Resp 06/10/15 1408 18     Temp 06/10/15 1408 98 F (36.7 C)     Temp Source 06/10/15 1408 Oral     SpO2 06/10/15 1408 100 %     Weight 06/10/15 1408 200 lb (90.719 kg)     Height 06/10/15 1408 5\' 10"  (1.778 m)     Head Cir --      Peak Flow --      Pain Score 06/10/15 1409 4     Pain Loc --      Pain Edu? --      Excl. in GC? --     Constitutional: Alert and oriented. Well appearing and in no acute distress. Eyes: Conjunctivae are normal. PERRL. EOMI. Head: Atraumatic. Nose: No congestion/rhinnorhea. Mouth/Throat: Mucous membranes are moist.  Oropharynx non-erythematous. Neck: No stridor.   Cardiovascular: Normal rate, regular rhythm. Grossly normal heart sounds.  Good peripheral circulation With intact in bilateral radial as well as dorsalis pedis pulses. Chest pain is reproducible with palpation over the left pectoralis muscle. Respiratory: Normal respiratory effort.  No retractions. Lungs CTAB. Gastrointestinal: Soft and nontender. No distention.  No CVA tenderness. Musculoskeletal: No lower extremity tenderness nor edema.  No joint effusions. Neurologic:  Normal speech and language. No gross focal neurologic deficits are appreciated.  Skin:  Skin is warm, dry and intact. No rash noted. Psychiatric: Mood and affect are normal. Speech and behavior are normal.  ____________________________________________   LABS (all labs ordered are listed, but  only abnormal results are  displayed)  Labs Reviewed  BASIC METABOLIC PANEL - Abnormal; Notable for the following:    BUN 21 (*)    Creatinine, Ser 1.10 (*)    GFR calc non Af Amer 48 (*)    GFR calc Af Amer 56 (*)    All other components within normal limits  CBC - Abnormal; Notable for the following:    WBC 2.9 (*)    RBC 3.62 (*)    Hemoglobin 11.3 (*)    HCT 34.7 (*)    RDW 15.4 (*)    All other components within normal limits  TROPONIN I  TROPONIN I   ____________________________________________  EKG  ED ECG REPORT I, Arelia Longest, the attending physician, personally viewed and interpreted this ECG.   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 No ST elevation or depression.  No significant change from EKG done on 11/30/2014. ____________________________________________  RADIOLOGY  Pending x-ray as well as CTA. ____________________________________________   PROCEDURES   ____________________________________________   INITIAL IMPRESSION / ASSESSMENT AND PLAN / ED COURSE  Pertinent labs & imaging results that were available during my care of the patient were reviewed by me and considered in my medical decision making (see chart for details).  ----------------------------------------- 3:16 PM on 06/10/2015 -----------------------------------------  Patient without any distress. Pending radiology results as well as second troponin. Signed out to Dr. Derrill Kay.   ____________________________________________   FINAL CLINICAL IMPRESSION(S) / ED DIAGNOSES  Chest pain    NEW MEDICATIONS STARTED DURING THIS VISIT:  New Prescriptions   No medications on file     Note:  This document was prepared using Dragon voice recognition software and may include unintentional dictation errors.    Myrna Blazer, MD 06/10/15 [phone]

## 2015-06-10 NOTE — ED Notes (Signed)
Patient transported to X-ray 

## 2015-06-10 NOTE — Discharge Instructions (Signed)
Please seek medical attention for any high fevers, chest pain, shortness of breath, change in behavior, persistent vomiting, bloody stool or any other new or concerning symptoms. ° ° °Nonspecific Chest Pain °It is often hard to find the cause of chest pain. There is always a chance that your pain could be related to something serious, such as a heart attack or a blood clot in your lungs. Chest pain can also be caused by conditions that are not life-threatening. If you have chest pain, it is very important to follow up with your doctor. ° °HOME CARE °· If you were prescribed an antibiotic medicine, finish it all even if you start to feel better. °· Avoid any activities that cause chest pain. °· Do not use any tobacco products, including cigarettes, chewing tobacco, or electronic cigarettes. If you need help quitting, ask your doctor. °· Do not drink alcohol. °· Take medicines only as told by your doctor. °· Keep all follow-up visits as told by your doctor. This is important. This includes any further testing if your chest pain does not go away. °· Your doctor may tell you to keep your head raised (elevated) while you sleep. °· Make lifestyle changes as told by your doctor. These may include: °¨ Getting regular exercise. Ask your doctor to suggest some activities that are safe for you. °¨ Eating a heart-healthy diet. Your doctor or a diet specialist (dietitian) can help you to learn healthy eating options. °¨ Maintaining a healthy weight. °¨ Managing diabetes, if necessary. °¨ Reducing stress. °GET HELP IF: °· Your chest pain does not go away, even after treatment. °· You have a rash with blisters on your chest. °· You have a fever. °GET HELP RIGHT AWAY IF: °· Your chest pain is worse. °· You have an increasing cough, or you cough up blood. °· You have severe belly (abdominal) pain. °· You feel extremely weak. °· You pass out (faint). °· You have chills. °· You have sudden, unexplained chest discomfort. °· You have  sudden, unexplained discomfort in your arms, back, neck, or jaw. °· You have shortness of breath at any time. °· You suddenly start to sweat, or your skin gets clammy. °· You feel nauseous. °· You vomit. °· You suddenly feel light-headed or dizzy. °· Your heart begins to beat quickly, or it feels like it is skipping beats. °These symptoms may be an emergency. Do not wait to see if the symptoms will go away. Get medical help right away. Call your local emergency services (911 in the [place] Do not drive yourself to the hospital. °  °This information is not intended to replace advice given to you by your health care provider. Make sure you discuss any questions you have with your health care provider. °  °Document Released: 06/29/2007 Document Revised: 01/31/2014 Document Reviewed: 08/16/2013 °Elsevier Interactive Patient Education ©2016 [employer]. ° °

## 2015-06-10 NOTE — ED Notes (Signed)
[facility] in order to give report to nursing staff.  Unable to reach the facility.

## 2015-06-10 NOTE — ED Notes (Signed)
Ems pt from [facility] , with chest pain beginning at 9am today with 10/10 pain , ems report 4/10 pain , asa given earlier. Pt describes the chest pain as someone ripping it out. No distress noted

## 2015-11-27 IMAGING — CR DG CHEST 1V PORT
1 series · 1 of 1 positions shown · non-contrast
Comparison: 12/17/2013 and earlier.

CLINICAL DATA: Acute onset of mid chest pain earlier this morning.
Prior CABG.

EXAM:
PORTABLE CHEST - 1 VIEW

[ap]
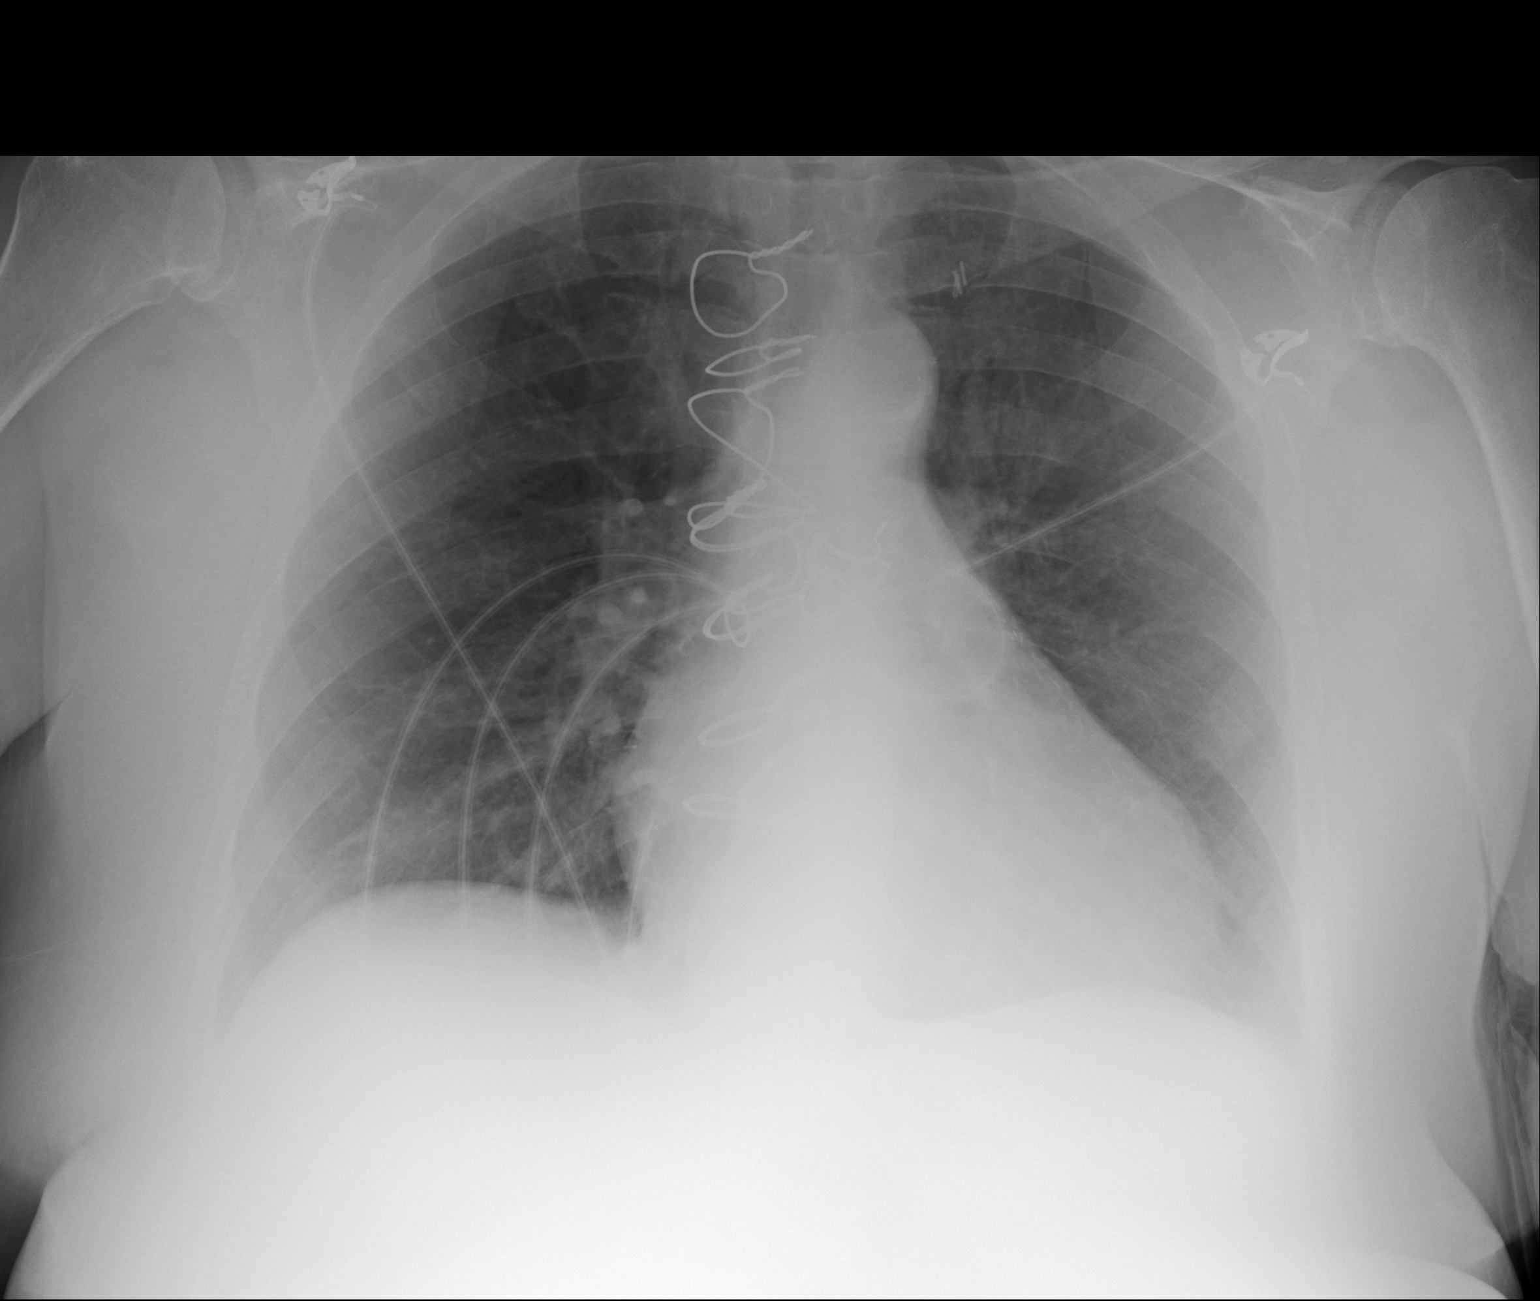

[1 of 1 positions shown; findings below may reference images not displayed]

FINDINGS: Sternotomy for CABG. Cardiac silhouette mildly enlarged, unchanged.
Lungs clear. Bronchovascular markings normal. Pulmonary vascularity
normal. No visible pleural effusions. No pneumothorax.
IMPRESSION: Stable mild cardiomegaly.  No acute cardiopulmonary disease.

## 2016-04-29 ENCOUNTER — Observation Stay
Admission: EM | Admit: 2016-04-29 | Discharge: 2016-05-01 | Disposition: A | Discharge status: Home / Self Care | Payer: Medicare (Managed Care) | Attending: Internal Medicine | Admitting: Internal Medicine

## 2016-04-29 ENCOUNTER — Emergency Department: Payer: Medicare (Managed Care)

## 2016-04-29 ENCOUNTER — Encounter: Payer: Self-pay | Admitting: Emergency Medicine

## 2016-04-29 DIAGNOSIS — I10 Essential (primary) hypertension: Secondary | ICD-10-CM | POA: Diagnosis present

## 2016-04-29 DIAGNOSIS — W19XXXA Unspecified fall, initial encounter: Secondary | ICD-10-CM

## 2016-04-29 DIAGNOSIS — N179 Acute kidney failure, unspecified: Secondary | ICD-10-CM | POA: Insufficient documentation

## 2016-04-29 DIAGNOSIS — I2510 Atherosclerotic heart disease of native coronary artery without angina pectoris: Secondary | ICD-10-CM | POA: Diagnosis present

## 2016-04-29 DIAGNOSIS — M25562 Pain in left knee: Secondary | ICD-10-CM | POA: Diagnosis not present

## 2016-04-29 DIAGNOSIS — Z87891 Personal history of nicotine dependence: Secondary | ICD-10-CM | POA: Diagnosis not present

## 2016-04-29 DIAGNOSIS — Z7902 Long term (current) use of antithrombotics/antiplatelets: Secondary | ICD-10-CM | POA: Diagnosis not present

## 2016-04-29 DIAGNOSIS — E860 Dehydration: Secondary | ICD-10-CM | POA: Insufficient documentation

## 2016-04-29 DIAGNOSIS — Z79899 Other long term (current) drug therapy: Secondary | ICD-10-CM | POA: Insufficient documentation

## 2016-04-29 DIAGNOSIS — I252 Old myocardial infarction: Secondary | ICD-10-CM | POA: Insufficient documentation

## 2016-04-29 DIAGNOSIS — S32502A Unspecified fracture of left pubis, initial encounter for closed fracture: Principal | ICD-10-CM | POA: Insufficient documentation

## 2016-04-29 DIAGNOSIS — M79605 Pain in left leg: Secondary | ICD-10-CM | POA: Diagnosis present

## 2016-04-29 DIAGNOSIS — E785 Hyperlipidemia, unspecified: Secondary | ICD-10-CM | POA: Diagnosis present

## 2016-04-29 DIAGNOSIS — Z96642 Presence of left artificial hip joint: Secondary | ICD-10-CM | POA: Insufficient documentation

## 2016-04-29 DIAGNOSIS — K219 Gastro-esophageal reflux disease without esophagitis: Secondary | ICD-10-CM | POA: Diagnosis not present

## 2016-04-29 DIAGNOSIS — W08XXXA Fall from other furniture, initial encounter: Secondary | ICD-10-CM | POA: Diagnosis not present

## 2016-04-29 DIAGNOSIS — D72819 Decreased white blood cell count, unspecified: Secondary | ICD-10-CM | POA: Diagnosis not present

## 2016-04-29 DIAGNOSIS — M25552 Pain in left hip: Secondary | ICD-10-CM

## 2016-04-29 DIAGNOSIS — S329XXA Fracture of unspecified parts of lumbosacral spine and pelvis, initial encounter for closed fracture: Secondary | ICD-10-CM | POA: Diagnosis present

## 2016-04-29 DIAGNOSIS — Z7982 Long term (current) use of aspirin: Secondary | ICD-10-CM | POA: Insufficient documentation

## 2016-04-29 DIAGNOSIS — Z951 Presence of aortocoronary bypass graft: Secondary | ICD-10-CM | POA: Diagnosis not present

## 2016-04-29 MED ORDER — DICLOFENAC SODIUM 1 % TD GEL: 2.0000 g | Freq: Four times a day (QID) | TRANSDERMAL | 0 refills | Status: DC

## 2016-04-29 MED ORDER — MORPHINE SULFATE (PF) 2 MG/ML IV SOLN
2.0000 mg | Freq: Once | INTRAVENOUS | Status: AC
  Administered 2016-04-30: 2 mg via INTRAVENOUS
  Filled 2016-04-29: qty 1

## 2016-04-29 MED ORDER — ACETAMINOPHEN 500 MG PO TABS
1000.0000 mg | ORAL_TABLET | Freq: Four times a day (QID) | ORAL | Status: DC | PRN
  Administered 2016-04-29: 1000 mg via ORAL
  Filled 2016-04-29: qty 2

## 2016-04-29 NOTE — ED Triage Notes (Signed)
Pt presents to ED 06 via EMS c/o fall from a couch earlier today; pt states she had hip replacement surgery on the left hip in December of 2016 and fell on that hip today; per EMS pt's VS bp 109/62, hr 53, rr 16, 93% on ra; pt c/o 10/10 pain; pt is alert and oriented x4; pt has good pedal pulses on that leg, is able to move her toes, has cap refill <3 sec, and no loss of sensation

## 2016-04-29 NOTE — ED Notes (Signed)
Pt returned from xray

## 2016-04-29 NOTE — ED Provider Notes (Signed)
[facility] Emergency Department Provider Note   ____________________________________________   I have reviewed the triage vital signs and the nursing notes.   HISTORY  Chief Complaint Left leg pain  History limited by: Not Limited   HPI Cynthia [name] is a 76 y.o. female who presents to the emergency department today because of concerns for left leg pain after fall. The patient states she was trying to get up off the couch when she fell. She states that she does fall frequently because she has trouble judging distances. She did fall onto her left knee and is complaining of pain in her left hip and knee. She denies hitting her head, headache or neck pain. No recent illness or fever.   Past Medical History:  Diagnosis Date  . Blood clot in vein   . Coronary artery disease   . GERD (gastroesophageal reflux disease)   . Heart attack   . Hx of CABG 2013  . Hyperlipidemia   . Hypertension     Patient Active Problem List   Diagnosis Date Noted  . Chest pain 09/08/2014  . Dehydration 09/08/2014    Past Surgical History:  Procedure Laterality Date  . CARDIAC SURGERY      Prior to Admission medications   Medication Sig Start Date End Date Taking? Authorizing Provider  acetaminophen (TYLENOL) 500 MG tablet Take 1,000 mg by mouth every 6 (six) hours as needed (pain).    Historical Provider, MD  albuterol (PROVENTIL) (2.5 MG/3ML) 0.083% nebulizer solution Take 2.5 mg by nebulization every 4 (four) hours as needed for wheezing or shortness of breath.    Historical Provider, MD  aspirin EC 81 MG tablet Take 81 mg by mouth daily.    Historical Provider, MD  clopidogrel (PLAVIX) 75 MG tablet Take 75 mg by mouth daily.    Historical Provider, MD  docusate sodium (COLACE) 100 MG capsule Take 100 mg by mouth 2 (two) times daily as needed for mild constipation.    Historical Provider, MD  DULoxetine (CYMBALTA) 20 MG capsule Take 20 mg by mouth daily.     Historical Provider, MD  enalapril (VASOTEC) 2.5 MG tablet Take 2.5 mg by mouth 2 (two) times daily.    Historical Provider, MD  ferrous sulfate 325 (65 FE) MG tablet Take 325 mg by mouth 2 (two) times daily.    Historical Provider, MD  furosemide (LASIX) 40 MG tablet Take 40 mg by mouth daily as needed for edema.    Historical Provider, MD  hydrALAZINE (APRESOLINE) 25 MG tablet Take 25 mg by mouth daily.     Historical Provider, MD  isosorbide mononitrate (IMDUR) 30 MG 24 hr tablet Take 30 mg by mouth daily.    Historical Provider, MD  latanoprost (XALATAN) 0.005 % ophthalmic solution Place 1 drop into both eyes at bedtime.    Historical Provider, MD  metoprolol tartrate (LOPRESSOR) 25 MG tablet Take 12.5 mg by mouth 2 (two) times daily.    Historical Provider, MD  ondansetron (ZOFRAN) 4 MG tablet Take 4 mg by mouth 3 (three) times daily as needed for nausea or vomiting.    Historical Provider, MD  oxyCODONE-acetaminophen (PERCOCET) 5-325 MG tablet Take 2 tablets by mouth every 6 (six) hours as needed for moderate pain or severe pain. 04/20/15   Emily Filbert, MD  pantoprazole (PROTONIX) 40 MG tablet Take 40 mg by mouth daily.     Historical Provider, MD  pravastatin (PRAVACHOL) 20 MG tablet Take 20 mg by  mouth at bedtime.    Historical Provider, MD  ranitidine (ZANTAC) 150 MG tablet Take 1 tablet (150 mg total) by mouth at bedtime. 06/10/15 06/09/16  Phineas Semen, MD    Allergies Penicillins  No family history on file.  Social History Social History  Substance Use Topics  . Smoking status: Former Smoker    Packs/day: 0.50    Years: 35.00    Types: Cigarettes  . Smokeless tobacco: Never Used  . Alcohol use No    Review of Systems  Constitutional: Negative for fever. Cardiovascular: Negative for chest pain. Respiratory: Negative for shortness of breath. Gastrointestinal: Negative for abdominal pain, vomiting and diarrhea. Genitourinary: Negative for  dysuria. Musculoskeletal: Left leg pain. Neurological: Negative for headaches, focal weakness or numbness.  10-point ROS otherwise negative.  ____________________________________________   PHYSICAL EXAM:  VITAL SIGNS: ED Triage Vitals  Enc Vitals Group     BP 118/67     Pulse 53     Resp 21     Temp 97.8     Temp src      SpO2 99   Constitutional: Alert and oriented. Well appearing and in no distress. Eyes: Conjunctivae are normal. Normal extraocular movements. ENT   Head: Normocephalic and atraumatic.   Nose: No congestion/rhinnorhea.   Mouth/Throat: Mucous membranes are moist.   Neck: No stridor. Hematological/Lymphatic/Immunilogical: No cervical lymphadenopathy. Cardiovascular: Normal rate, regular rhythm.  No murmurs, rubs, or gallops.  Respiratory: Normal respiratory effort without tachypnea nor retractions. Breath sounds are clear and equal bilaterally. No wheezes/rales/rhonchi. Gastrointestinal: Soft and non tender. No rebound. No guarding.  Genitourinary: Deferred Musculoskeletal: No obvious deformity. Tender to palpation over the left knee pain. Neurologic:  Normal speech and language. No gross focal neurologic deficits are appreciated.  Skin:  Skin is warm, dry and intact. No rash noted.   ____________________________________________    LABS (pertinent positives/negatives)  None  ____________________________________________   EKG  None  ____________________________________________    RADIOLOGY  Left hip IMPRESSION: Nondisplaced linear lucency involving the left parasymphysis consistent with an acute fracture.  Left knee IMPRESSION:  Osteoarthritic joint space narrowing of the medial femorotibial  compartment. No acute osseous abnormality. Trace joint effusion.     ____________________________________________   PROCEDURES  Procedures  ____________________________________________   INITIAL IMPRESSION / ASSESSMENT AND  PLAN / ED COURSE  Pertinent labs & imaging results that were available during my care of the patient were reviewed by me and considered in my medical decision making (see chart for details).  Patient with left hip and knee pain after fall. X-ray shows left pubic ramis fracture. Will plan on admission to hospitalist service.   ____________________________________________   FINAL CLINICAL IMPRESSION(S) / ED DIAGNOSES  Final diagnoses:  Fall, initial encounter  Acute pain of left knee  Pain of left hip joint     Note: This dictation was prepared with Dragon dictation. Any transcriptional errors that result from this process are unintentional     Phineas Semen, MD 04/29/16 2345

## 2016-04-30 ENCOUNTER — Encounter: Payer: Self-pay | Admitting: Internal Medicine

## 2016-04-30 DIAGNOSIS — K219 Gastro-esophageal reflux disease without esophagitis: Secondary | ICD-10-CM | POA: Diagnosis present

## 2016-04-30 DIAGNOSIS — I10 Essential (primary) hypertension: Secondary | ICD-10-CM | POA: Diagnosis present

## 2016-04-30 DIAGNOSIS — I2510 Atherosclerotic heart disease of native coronary artery without angina pectoris: Secondary | ICD-10-CM | POA: Diagnosis present

## 2016-04-30 DIAGNOSIS — E785 Hyperlipidemia, unspecified: Secondary | ICD-10-CM | POA: Diagnosis present

## 2016-04-30 DIAGNOSIS — S329XXA Fracture of unspecified parts of lumbosacral spine and pelvis, initial encounter for closed fracture: Secondary | ICD-10-CM | POA: Diagnosis present

## 2016-04-30 LAB — BASIC METABOLIC PANEL
ANION GAP: 2 — AB (ref 5–15)
Anion gap: 4 — ABNORMAL LOW (ref 5–15)
BUN: 31 mg/dL — AB (ref 6–20)
BUN: 29 mg/dL — AB (ref 6–20)
CALCIUM: 8.9 mg/dL (ref 8.9–10.3)
CALCIUM: 9.2 mg/dL (ref 8.9–10.3)
CO2: 29 mmol/L (ref 22–32)
CO2: 29 mmol/L (ref 22–32)
CREATININE: 1.33 mg/dL — AB (ref 0.44–1.00)
Chloride: 105 mmol/L (ref 101–111)
Chloride: 107 mmol/L (ref 101–111)
Creatinine, Ser: 1.44 mg/dL — ABNORMAL HIGH (ref 0.44–1.00)
GFR calc Af Amer: 40 mL/min — ABNORMAL LOW (ref >60)
GFR calc Af Amer: 44 mL/min — ABNORMAL LOW (ref >60)
GFR, EST NON AFRICAN AMERICAN: 35 mL/min — AB (ref >60)
GFR, EST NON AFRICAN AMERICAN: 38 mL/min — AB (ref >60)
GLUCOSE: 89 mg/dL (ref 65–99)
Glucose, Bld: 85 mg/dL (ref 65–99)
POTASSIUM: 4.3 mmol/L (ref 3.5–5.1)
Potassium: 4.2 mmol/L (ref 3.5–5.1)
Sodium: 138 mmol/L (ref 135–145)
Sodium: 138 mmol/L (ref 135–145)

## 2016-04-30 LAB — CBC WITH DIFFERENTIAL/PLATELET
Basophils Absolute: 0 10*3/uL (ref 0–0.1)
Basophils Relative: 1 %
EOS PCT: 5 %
Eosinophils Absolute: 0.1 10*3/uL (ref 0–0.7)
HCT: 37.6 % (ref 35.0–47.0)
Hemoglobin: 12.4 g/dL (ref 12.0–16.0)
LYMPHS ABS: 1 10*3/uL (ref 1.0–3.6)
LYMPHS PCT: 41 %
MCH: 33.2 pg (ref 26.0–34.0)
MCHC: 33 g/dL (ref 32.0–36.0)
MCV: 100.7 fL — AB (ref 80.0–100.0)
MONO ABS: 0.4 10*3/uL (ref 0.2–0.9)
Monocytes Relative: 18 %
Neutro Abs: 0.9 10*3/uL — ABNORMAL LOW (ref 1.4–6.5)
Neutrophils Relative %: 35 %
PLATELETS: 132 10*3/uL — AB (ref 150–440)
RBC: 3.74 MIL/uL — ABNORMAL LOW (ref 3.80–5.20)
RDW: 13.7 % (ref 11.5–14.5)
WBC: 2.5 10*3/uL — ABNORMAL LOW (ref 3.6–11.0)

## 2016-04-30 LAB — CBC
HEMATOCRIT: 35.4 % (ref 35.0–47.0)
HEMOGLOBIN: 11.6 g/dL — AB (ref 12.0–16.0)
MCH: 33.3 pg (ref 26.0–34.0)
MCHC: 32.8 g/dL (ref 32.0–36.0)
MCV: 101.6 fL — ABNORMAL HIGH (ref 80.0–100.0)
Platelets: 110 10*3/uL — ABNORMAL LOW (ref 150–440)
RBC: 3.49 MIL/uL — ABNORMAL LOW (ref 3.80–5.20)
RDW: 13.7 % (ref 11.5–14.5)
WBC: 2.3 10*3/uL — AB (ref 3.6–11.0)

## 2016-04-30 MED ORDER — MORPHINE SULFATE (PF) 2 MG/ML IV SOLN
2.0000 mg | INTRAVENOUS | Status: DC | PRN
  Administered 2016-04-30: 2 mg via INTRAVENOUS
  Filled 2016-04-30: qty 1

## 2016-04-30 MED ORDER — TIOTROPIUM BROMIDE MONOHYDRATE 18 MCG IN CAPS
18.0000 ug | ORAL_CAPSULE | Freq: Every day | RESPIRATORY_TRACT | Status: DC
  Administered 2016-04-30 – 2016-05-01 (×2): 18 ug via RESPIRATORY_TRACT
  Filled 2016-04-30: qty 5

## 2016-04-30 MED ORDER — DULOXETINE HCL 60 MG PO CPEP
60.0000 mg | ORAL_CAPSULE | Freq: Every day | ORAL | Status: DC
  Administered 2016-04-30 – 2016-05-01 (×2): 60 mg via ORAL
  Filled 2016-04-30 (×2): qty 1

## 2016-04-30 MED ORDER — ISOSORBIDE MONONITRATE ER 30 MG PO TB24
30.0000 mg | ORAL_TABLET | Freq: Every day | ORAL | Status: DC
  Administered 2016-05-01: 30 mg via ORAL
  Filled 2016-04-30: qty 1

## 2016-04-30 MED ORDER — TRAZODONE HCL 50 MG PO TABS
50.0000 mg | ORAL_TABLET | Freq: Every day | ORAL | Status: DC
  Administered 2016-04-30 (×2): 50 mg via ORAL
  Filled 2016-04-30 (×2): qty 1

## 2016-04-30 MED ORDER — ACETAMINOPHEN 650 MG RE SUPP: 650.0000 mg | Freq: Four times a day (QID) | RECTAL | Status: DC | PRN

## 2016-04-30 MED ORDER — BISACODYL 10 MG RE SUPP: 10.0000 mg | Freq: Every day | RECTAL | Status: DC | PRN

## 2016-04-30 MED ORDER — OXYCODONE-ACETAMINOPHEN 5-325 MG PO TABS
1.0000 | ORAL_TABLET | Freq: Four times a day (QID) | ORAL | Status: DC | PRN
Start: 2016-04-30 — End: 2016-05-01
  Administered 2016-04-30 – 2016-05-01 (×5): 1 via ORAL
  Filled 2016-04-30 (×5): qty 1

## 2016-04-30 MED ORDER — SENNOSIDES-DOCUSATE SODIUM 8.6-50 MG PO TABS
1.0000 | ORAL_TABLET | Freq: Every day | ORAL | Status: DC
  Administered 2016-04-30 – 2016-05-01 (×2): 1 via ORAL
  Filled 2016-04-30 (×2): qty 1

## 2016-04-30 MED ORDER — CLOPIDOGREL BISULFATE 75 MG PO TABS
75.0000 mg | ORAL_TABLET | Freq: Every day | ORAL | Status: DC
  Administered 2016-04-30 – 2016-05-01 (×2): 75 mg via ORAL
  Filled 2016-04-30 (×2): qty 1

## 2016-04-30 MED ORDER — LATANOPROST 0.005 % OP SOLN
1.0000 [drp] | Freq: Every day | OPHTHALMIC | Status: DC
  Administered 2016-04-30 (×2): 1 [drp] via OPHTHALMIC
  Filled 2016-04-30: qty 2.5

## 2016-04-30 MED ORDER — SODIUM CHLORIDE 0.9 % IV SOLN
INTRAVENOUS | Status: AC
  Administered 2016-04-30 (×2): via INTRAVENOUS

## 2016-04-30 MED ORDER — METOPROLOL SUCCINATE ER 25 MG PO TB24
25.0000 mg | ORAL_TABLET | Freq: Every day | ORAL | Status: DC
  Administered 2016-05-01: 25 mg via ORAL
  Filled 2016-04-30: qty 1

## 2016-04-30 MED ORDER — ONDANSETRON HCL 4 MG/2ML IJ SOLN: 4.0000 mg | Freq: Four times a day (QID) | INTRAMUSCULAR | Status: DC | PRN

## 2016-04-30 MED ORDER — ONDANSETRON HCL 4 MG PO TABS: 4.0000 mg | ORAL_TABLET | Freq: Four times a day (QID) | ORAL | Status: DC | PRN

## 2016-04-30 MED ORDER — PRAVASTATIN SODIUM 20 MG PO TABS
20.0000 mg | ORAL_TABLET | Freq: Every day | ORAL | Status: DC
  Administered 2016-04-30 (×2): 20 mg via ORAL
  Filled 2016-04-30 (×2): qty 1

## 2016-04-30 MED ORDER — MAGNESIUM HYDROXIDE 400 MG/5ML PO SUSP
15.0000 mL | Freq: Every day | ORAL | Status: DC | PRN
  Administered 2016-05-01: 15 mL via ORAL
  Filled 2016-04-30: qty 30

## 2016-04-30 MED ORDER — ENALAPRIL MALEATE 5 MG PO TABS
2.5000 mg | ORAL_TABLET | Freq: Two times a day (BID) | ORAL | Status: DC
  Administered 2016-04-30 – 2016-05-01 (×3): 2.5 mg via ORAL
  Filled 2016-04-30 (×3): qty 1

## 2016-04-30 MED ORDER — ACETAMINOPHEN 325 MG PO TABS: 650.0000 mg | ORAL_TABLET | Freq: Four times a day (QID) | ORAL | Status: DC | PRN

## 2016-04-30 MED ORDER — FAMOTIDINE 20 MG PO TABS
20.0000 mg | ORAL_TABLET | Freq: Every day | ORAL | Status: DC
  Administered 2016-04-30 – 2016-05-01 (×2): 20 mg via ORAL
  Filled 2016-04-30 (×2): qty 1

## 2016-04-30 MED ORDER — ASPIRIN EC 81 MG PO TBEC
81.0000 mg | DELAYED_RELEASE_TABLET | Freq: Every day | ORAL | Status: DC
  Administered 2016-04-30 – 2016-05-01 (×2): 81 mg via ORAL
  Filled 2016-04-30 (×2): qty 1

## 2016-04-30 MED ORDER — ENOXAPARIN SODIUM 40 MG/0.4ML ~~LOC~~ SOLN
40.0000 mg | SUBCUTANEOUS | Status: DC
  Administered 2016-04-30: 40 mg via SUBCUTANEOUS
  Filled 2016-04-30: qty 0.4

## 2016-04-30 NOTE — Care Management Obs Status (Signed)
MEDICARE OBSERVATION STATUS NOTIFICATION   Patient Details  Name: Cynthia [name] MRN: [id number] Date of Birth: 1940/05/13   Medicare Observation Status Notification Given:  Yes (MOON letter given)    Jolee Ewing, RN 04/30/2016, 5:25 PM

## 2016-04-30 NOTE — Evaluation (Signed)
Physical Therapy Evaluation Patient Details Name: Cynthia [name] MRN: [id number] DOB: [birth year] Today's Date: 04/30/2016   History of Present Illness  76 yo female with onset of L pubic ramus fracture after a fall at home from  bedside.  Has pain in R shoulder, L toes, L hip as well.  PMHx:  L THA, [facility] participant on PT, DVT, GERD, CAD, CABG,    Clinical Impression  Pt is more alert this morning after OT had been by to see her.  PT has stopped in to get her up to side of bed and walked two trips in room with pt ending up in chair with nursing in to administer meds.  Pt is made comfortable with ice for L hand and wrist for IV infiltration and with chair alarm in place as pt is demonstrating some confusion about her surroundings.  Will follow acutely for strengthening and balance training and will have HHPT follow up until pt is ready to return to [facility].      Follow Up Recommendations Home health PT;Supervision/Assistance - 24 hour    Equipment Recommendations  None recommended by PT    Recommendations for Other Services       Precautions / Restrictions Precautions Precautions: Fall Restrictions Weight Bearing Restrictions: Yes LLE Weight Bearing: Weight bearing as tolerated      Mobility  Bed Mobility Overal bed mobility: Needs Assistance Bed Mobility: Supine to Sit     Supine to sit: Min assist Sit to supine: Min assist   General bed mobility comments: cued for use of bedrail and moving limbs and PT supported under her back to edge of bed  Transfers Overall transfer level: Needs assistance Equipment used: Rolling walker (2 wheeled);1 person hand held assist Transfers: Sit to/from [employer] Sit to Stand: Min guard;Min assist Stand pivot transfers: Min guard;Min assist          Ambulation/Gait Ambulation/Gait assistance: Min guard;Min assist Ambulation Distance (Feet): 40 Feet (15+25) Assistive device: Rolling walker (2 wheeled);1 person hand  held assist Gait Pattern/deviations: Step-to pattern;Step-through pattern;Decreased stride length;Trunk flexed;Narrow base of support Gait velocity: reduced Gait velocity interpretation: Below normal speed for age/gender General Gait Details: pt demonstrated poor control of walker as she is avoiding use of her L arm.  Tends to   [employer]    Modified Rankin (Stroke Patients Only)       Balance Overall balance assessment: History of Falls;Needs assistance Sitting-balance support: Feet supported Sitting balance-Leahy Scale: Fair       Standing balance-Leahy Scale: Fair Standing balance comment: BUE's to control balance with walker but declines to use LUE much due to pain from IV                             Pertinent Vitals/Pain Pain Assessment: 0-10 Pain Score: 8  Pain Location: L hip, L toes Pain Descriptors / Indicators: Aching;Sore Pain Intervention(s): Limited activity within patient's tolerance;Monitored during session;Premedicated before session;Repositioned;RN gave pain meds during session;Ice applied (ice to L hand and arm from IV infiltration)    Home Living Family/patient expects to be discharged to:: Private residence Living Arrangements: Spouse/significant other Available Help at Discharge: Family Type of Home: Apartment Home Access: Stairs to enter Entrance Stairs-Rails: Can reach both Entrance Stairs-Number of Steps: 4 Home Layout: One level Home Equipment: Grab bars - tub/shower;Walker - 4 wheels;Walker - 2 wheels  Prior Function Level of Independence: Needs assistance   Gait / Transfers Assistance Needed: walker and also uses rollator  ADL's / Homemaking Assistance Needed: She recently had a home health aide who was coming 5 days a week to help with basic self care tasks, husband assists with light homemaking and meal prep.          Hand Dominance   Dominant Hand: Right    Extremity/Trunk  Assessment   Upper Extremity Assessment Upper Extremity Assessment: LUE deficits/detail RUE Deficits / Details: patient reports pain at times with right shoulder  LUE Deficits / Details: had infiltrated IV on L arm and nurse removed needle, but was edematous and painful, could not use on walker    Lower Extremity Assessment Lower Extremity Assessment: Generalized weakness    Cervical / Trunk Assessment Cervical / Trunk Assessment: Normal  Communication   Communication: No difficulties  Cognition Arousal/Alertness: Awake/alert Behavior During Therapy: WFL for tasks assessed/performed Overall Cognitive Status: No family/caregiver present to determine baseline cognitive functioning                                 General Comments: pt at one point asked PT to let her get on the bed and was standing at the counter at sink (had to convince her to walk back to bed)      General Comments General comments (skin integrity, edema, etc.): pt is a little lethargic after having early AM difficulty awakening her, but also had IV narcotic meds for pain which increased her awareness    Exercises     Assessment/Plan    PT Assessment Patient needs continued PT services  PT Problem List Decreased strength;Decreased range of motion;Decreased activity tolerance;Decreased balance;Decreased mobility;Decreased coordination;Decreased cognition;Decreased knowledge of use of DME;Decreased safety awareness;Obesity;Decreased skin integrity;Pain       PT Treatment Interventions DME instruction;Functional mobility training;Gait training;Stair training;Therapeutic activities;Therapeutic exercise;Balance training;Neuromuscular re-education;Patient/family education    PT Goals (Current goals can be found in the Care Plan section)  Acute Rehab PT Goals Patient Stated Goal: Patient reports she wants to do what she did before and wants to go back to the [facility] program again. PT Goal Formulation: With  patient Time For Goal Achievement: 05/14/16 Potential to Achieve Goals: Good    Frequency 7X/week   Barriers to discharge Inaccessible home environment stairs to enter house    Co-evaluation               End of Session Equipment Utilized During Treatment: Gait belt Activity Tolerance: Patient tolerated treatment well;Patient limited by fatigue;Patient limited by pain Patient left: in chair;with call bell/phone within reach;with chair alarm set Nurse Communication: Mobility status;Patient requests pain meds PT Visit Diagnosis: Unsteadiness on feet (R26.81);Ataxic gait (R26.0);Pain Pain - Right/Left: Left Pain - part of body: Hand    Time: 1125-1155 PT Time Calculation (min) (ACUTE ONLY): 30 min   Charges:   PT Evaluation $PT Eval Moderate Complexity: 1 Procedure PT Treatments $Gait Training: 8-22 mins   PT G Codes:   PT G-Codes **NOT FOR INPATIENT CLASS** Functional Assessment Tool Used: AM-PAC 6 Clicks Basic Mobility;Clinical judgement Functional Limitation: Mobility: Walking and moving around Mobility: Walking and Moving Around Current Status (Z6109): At least 20 percent but less than 40 percent impaired, limited or restricted Mobility: Walking and Moving Around Goal Status [phone]): At least 1 percent but less than 20 percent impaired, limited or restricted    Gemma Payor  Jaquavion Mccannon 04/30/2016, 12:25 PM   Samul Dada, PT MS Acute Rehab Dept. Number: [facility] [id number] and [facility] [phone]

## 2016-04-30 NOTE — Progress Notes (Signed)
New Admission Note:   Arrival Method: per stretcher from ED, pt came from home Mental Orientation: alert and oriented X4, with intermittent cofusion and forgetfulness (not new per husband) Telemetry: none ordered Assessment: Completed Skin: warm, dry, with old surgical scar noted on the left hip, no preexisting open wound/sore noted, prophylactic sacral foam dressing applied IV: G20 on the left forearm with transparent dressing, flushed and saline locked Pain: 8/10 scale on the left hip, will administer PRN pain medicine Safety Measures: Safety Fall Prevention Plan has been given and discussed Admission: Completed 1A Orientation: Patient has been orientated to the room, unit and staff.  Family: husband at the bedside  Orders have been reviewed and implemented. Will continue to monitor patient. Call light has been placed within reach and bed alarm has been activated.   Janice Norrie BSN, RN [facility] 1A

## 2016-04-30 NOTE — H&P (Signed)
[facility] - [place] at [facility]   PATIENT NAME: Cynthia [name]    MR#:  [id number]  DATE OF BIRTH:  [birth year]  DATE OF ADMISSION:  04/29/2016  PRIMARY CARE PHYSICIAN: Dortha Kern, MD   REQUESTING/REFERRING PHYSICIAN: Derrill Kay, MD  CHIEF COMPLAINT:   Chief Complaint  Patient presents with  . Fall    HISTORY OF PRESENT ILLNESS:  Cynthia [name]  is a 76 y.o. female who presents with Fall and subsequent pelvic fracture. Patient was getting up from her bed, and fell. She has significant anterior pelvic pain. X-ray here shows fracture of the pubic ramus. Hospitals were called for admission.  PAST MEDICAL HISTORY:   Past Medical History:  Diagnosis Date  . Blood clot in vein   . Coronary artery disease   . GERD (gastroesophageal reflux disease)   . Heart attack   . Hx of CABG 2013  . Hyperlipidemia   . Hypertension     PAST SURGICAL HISTORY:   Past Surgical History:  Procedure Laterality Date  . CARDIAC SURGERY    . HIP SURGERY Left 12/2014    SOCIAL HISTORY:   Social History  Substance Use Topics  . Smoking status: Former Smoker    Packs/day: 0.50    Years: 35.00    Types: Cigarettes  . Smokeless tobacco: Never Used  . Alcohol use No    FAMILY HISTORY:   Family History  Problem Relation Age of Onset  . Hypertension Other   . Alcohol abuse Other     DRUG ALLERGIES:   Allergies  Allergen Reactions  . Penicillins Swelling and Other (See Comments)    Pt states that her tongue swells and turns white.      MEDICATIONS AT HOME:   Prior to Admission medications   Medication Sig Start Date End Date Taking? Authorizing Provider  albuterol (PROVENTIL HFA;VENTOLIN HFA) 108 (90 Base) MCG/ACT inhaler Inhale 1-2 puffs into the lungs every 4 (four) hours as needed for wheezing or shortness of breath.   Yes Historical Provider, MD  albuterol (PROVENTIL) (2.5 MG/3ML) 0.083% nebulizer solution Take 2.5 mg by nebulization every 4 (four) hours as  needed for wheezing or shortness of breath.   Yes Historical Provider, MD  aspirin EC 81 MG tablet Take 81 mg by mouth daily.   Yes Historical Provider, MD  calcium carbonate (TUMS - DOSED IN MG ELEMENTAL CALCIUM) 500 MG chewable tablet Chew 2 tablets by mouth 4 (four) times daily as needed for indigestion or heartburn.   Yes Historical Provider, MD  clopidogrel (PLAVIX) 75 MG tablet Take 75 mg by mouth daily.   Yes Historical Provider, MD  dextromethorphan-guaiFENesin (MUCINEX DM) 30-600 MG 12hr tablet Take 1 tablet by mouth 2 (two) times daily as needed for cough.   Yes Historical Provider, MD  dextromethorphan-guaiFENesin (ROBITUSSIN-DM) 10-100 MG/5ML liquid Take 20 mLs by mouth every 6 (six) hours as needed for cough.   Yes Historical Provider, MD  diclofenac sodium (VOLTAREN) 1 % GEL Apply 2 g topically 4 (four) times daily as needed (pain).   Yes Historical Provider, MD  DULoxetine (CYMBALTA) 60 MG capsule Take 60 mg by mouth daily.   Yes Historical Provider, MD  enalapril (VASOTEC) 2.5 MG tablet Take 2.5 mg by mouth 2 (two) times daily.   Yes Historical Provider, MD  ferrous sulfate 325 (65 FE) MG tablet Take 325 mg by mouth 2 (two) times daily.   Yes Historical Provider, MD  furosemide (LASIX) 40 MG tablet Take  40 mg by mouth daily.    Yes Historical Provider, MD  hydrALAZINE (APRESOLINE) 25 MG tablet Take 25 mg by mouth daily.    Yes Historical Provider, MD  isosorbide mononitrate (IMDUR) 30 MG 24 hr tablet Take 30 mg by mouth daily.   Yes Historical Provider, MD  latanoprost (XALATAN) 0.005 % ophthalmic solution Place 1 drop into both eyes at bedtime.   Yes Historical Provider, MD  loperamide (IMODIUM) 2 MG capsule Take 2-4 mg by mouth every 6 (six) hours as needed for diarrhea or loose stools. Take 2 capsules after first loose stool, then 1 capsule every 6 hours as needed. No more than 6 capsules per day.   Yes Historical Provider, MD  magnesium hydroxide (MILK OF MAGNESIA) 400 MG/5ML  suspension Take 15-30 mLs by mouth daily as needed for mild constipation.   Yes Historical Provider, MD  metoprolol succinate (TOPROL-XL) 25 MG 24 hr tablet Take 25 mg by mouth daily.   Yes Historical Provider, MD  naloxone [facility]) nasal spray 4 mg/0.1 mL Place 1 spray into the nose once as needed (narcotic overdose).    Yes Historical Provider, MD  nitroGLYCERIN (NITROSTAT) 0.3 MG SL tablet Place 0.3 mg under the tongue every 5 (five) minutes as needed for chest pain.   Yes Historical Provider, MD  ondansetron (ZOFRAN) 4 MG tablet Take 4 mg by mouth 3 (three) times daily as needed for nausea or vomiting.   Yes Historical Provider, MD  oxyCODONE-acetaminophen (PERCOCET/ROXICET) 5-325 MG tablet Take 1 tablet by mouth 2 (two) times daily as needed for severe pain.   Yes Historical Provider, MD  pravastatin (PRAVACHOL) 20 MG tablet Take 20 mg by mouth at bedtime.   Yes Historical Provider, MD  predniSONE (DELTASONE) 20 MG tablet Take 20 mg by mouth 3 (three) times daily as needed (breathing problems).   Yes Historical Provider, MD  ranitidine (ZANTAC) 150 MG tablet Take 1 tablet (150 mg total) by mouth at bedtime. 06/10/15 06/09/16 Yes Phineas Semen, MD  senna-docusate (SENOKOT-S) 8.6-50 MG tablet Take 1 tablet by mouth daily.   Yes Historical Provider, MD  tiotropium (SPIRIVA) 18 MCG inhalation capsule Place 18 mcg into inhaler and inhale daily.   Yes Historical Provider, MD  traZODone (DESYREL) 50 MG tablet Take 50 mg by mouth at bedtime.   Yes Historical Provider, MD  Vitamin D, Ergocalciferol, (DRISDOL) 50000 units CAPS capsule Take 50,000 Units by mouth every 30 (thirty) days. Take on the first day of each month.   Yes Historical Provider, MD  diclofenac sodium (VOLTAREN) 1 % GEL Apply 2 g topically 4 (four) times daily. 04/29/16   Phineas Semen, MD    REVIEW OF SYSTEMS:  Review of Systems  Constitutional: Negative for chills, fever, malaise/fatigue and weight loss.  HENT: Negative for ear  pain, hearing loss and tinnitus.   Eyes: Negative for blurred vision, double vision, pain and redness.  Respiratory: Negative for cough, hemoptysis and shortness of breath.   Cardiovascular: Negative for chest pain, palpitations, orthopnea and leg swelling.  Gastrointestinal: Negative for abdominal pain, constipation, diarrhea, nausea and vomiting.  Genitourinary: Negative for dysuria, frequency and hematuria.  Musculoskeletal: Positive for falls. Negative for back pain, joint pain and neck pain.       Anterior pelvic pain  Skin:       No acne, rash, or lesions  Neurological: Negative for dizziness, tremors, focal weakness and weakness.  Endo/Heme/Allergies: Negative for polydipsia. Does not bruise/bleed easily.  Psychiatric/Behavioral: Negative for depression. The  patient is not nervous/anxious and does not have insomnia.      VITAL SIGNS:   Vitals:   04/29/16 2220 04/29/16 2228  BP: 118/67   Pulse: (!) 53   Resp: (!) 21   Temp: 97.8 F (36.6 C)   TempSrc: Oral   SpO2: 99% 98%  Weight: 104.3 kg (230 lb)   Height: 6' (1.829 m)    Wt Readings from Last 3 Encounters:  04/29/16 104.3 kg (230 lb)  06/10/15 90.7 kg (200 lb)  04/20/15 111.6 kg (246 lb)    PHYSICAL EXAMINATION:  Physical Exam  Vitals reviewed. Constitutional: She is oriented to person, place, and time. She appears well-developed and well-nourished. No distress.  HENT:  Head: Normocephalic and atraumatic.  Mouth/Throat: Oropharynx is clear and moist.  Eyes: Conjunctivae and EOM are normal. Pupils are equal, round, and reactive to light. No scleral icterus.  Neck: Normal range of motion. Neck supple. No JVD present. No thyromegaly present.  Cardiovascular: Normal rate, regular rhythm and intact distal pulses.  Exam reveals no gallop and no friction rub.   No murmur heard. Respiratory: Effort normal and breath sounds normal. No respiratory distress. She has no wheezes. She has no rales.  GI: Soft. Bowel sounds  are normal. She exhibits no distension. There is no tenderness.  Musculoskeletal: Normal range of motion. She exhibits tenderness (Anterior pelvis). She exhibits no edema.  No arthritis, no gout  Lymphadenopathy:    She has no cervical adenopathy.  Neurological: She is alert and oriented to person, place, and time. No cranial nerve deficit.  No dysarthria, no aphasia  Skin: Skin is warm and dry. No rash noted. No erythema.  Psychiatric: She has a normal mood and affect. Her behavior is normal. Judgment and thought content normal.    LABORATORY PANEL:   CBC No results for input(s): WBC, HGB, HCT, PLT in the last 168 hours. ------------------------------------------------------------------------------------------------------------------  Chemistries  No results for input(s): NA, K, CL, CO2, GLUCOSE, BUN, CREATININE, CALCIUM, MG, AST, ALT, ALKPHOS, BILITOT in the last 168 hours.  Invalid input(s): GFRCGP ------------------------------------------------------------------------------------------------------------------  Cardiac Enzymes No results for input(s): TROPONINI in the last 168 hours. ------------------------------------------------------------------------------------------------------------------  RADIOLOGY:  Dg Knee Complete 4 Views Left  Result Date: 04/29/2016 CLINICAL DATA:  Pain after fall from couch today EXAM: LEFT KNEE - COMPLETE 4+ VIEW COMPARISON:  None. FINDINGS: Medial femorotibial joint space narrowing or trace joint effusion. No acute fracture or malalignment. Vascular clips are seen medially within the proximal leg. There is mild soft tissue induration along the lateral aspect of the thigh and knee. 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 No acute osseous abnormality. Trace joint effusion. Electronically Signed   By: Tollie Eth M.D.   On: 04/29/2016 23:08   Dg  Hip Unilat W Or Wo Pelvis 2-3 Views Left  Result Date: 04/29/2016 CLINICAL DATA:  Pain after fall EXAM: DG HIP (WITH OR WITHOUT PELVIS) 2-3V LEFT COMPARISON:  06/15/2014 FINDINGS: There is an undisplaced linear lucency involving the superior pubic ramus near the pubic symphysis extending to the inferior pubic ramus. Findings are consistent with an acute nondisplaced fracture. There appear to be old posttraumatic deformity of the inferior pubic rami bilaterally as well. There is an intact appearing uncemented left total hip arthroplasty. No hardware failure or malalignment. The bony pelvis is otherwise unremarkable apart from bilateral tubal ligation clips. IMPRESSION: Nondisplaced linear lucency involving the left parasymphysis consistent with an  acute fracture. Electronically Signed   By: Tollie Eth M.D.   On: 04/29/2016 23:14    EKG:   Orders placed or performed during the hospital encounter of 06/10/15  . ED EKG within 10 minutes  . ED EKG within 10 minutes  . EKG 12-Lead  . EKG 12-Lead    IMPRESSION AND PLAN:  Principal Problem:   Pelvic fracture (HCC) - pain control, orthopedics consult for recommendations, PT and OT evaluations Active Problems:   HTN (hypertension) - continue home meds   CAD (coronary artery disease) - continue home medications   HLD (hyperlipidemia) - home dose anti lipids   GERD (gastroesophageal reflux disease) - home dose PPI  All the records are reviewed and case discussed with ED provider. Management plans discussed with the patient and/or family.  DVT PROPHYLAXIS: SubQ lovenox  GI PROPHYLAXIS: PPI  ADMISSION STATUS: Observation  CODE STATUS: Full Code Status History    Date Active Date Inactive Code Status Order ID Comments User Context   09/08/2014  5:19 PM 09/09/2014  8:11 PM Full Code [id number]  Shaune Pollack, MD Inpatient      TOTAL TIME TAKING CARE OF THIS PATIENT: 40 minutes.    Caylyn Tedeschi FIELDING 04/30/2016, 12:14 AM  [facility]io [facility]  Hospitalists  Office  [phone]  CC: Primary care physician; Dortha Kern, MD

## 2016-04-30 NOTE — Clinical Social Work Note (Signed)
CSW watching for needs pending PT evaluation.  Argentina Ponder, MSW, Theresia Majors [phone]

## 2016-04-30 NOTE — Consult Note (Signed)
76 year old suffered a fall last night and has a left-sided pelvic fracture. She has a history of prior left total hip replacement was done well. She has significant pain with any weightbearing and was admitted for this On exam she is neurovascular intact left lower extremity with some pain with logrolling and pelvic compartment lateral compression test. X-rays reviewed and shows nondisplaced fracture left-sided pubic rami Impression is stable pelvic fracture Recommendation is physical therapy weightbearing as tolerated, pain control as needed

## 2016-04-30 NOTE — Progress Notes (Signed)
[facility] - [place] at [facility]   PATIENT NAME: Cynthia [name]    MR#:  [id number]  DATE OF BIRTH:  [birth year]  SUBJECTIVE:  CHIEF COMPLAINT:   Chief Complaint  Patient presents with  . Fall   Pelvic pain and generalized weakness REVIEW OF SYSTEMS:  Review of Systems  Constitutional: Positive for malaise/fatigue. Negative for chills and fever.  HENT: Negative for congestion.   Eyes: Negative for blurred vision and double vision.  Respiratory: Negative for cough, shortness of breath and stridor.   Cardiovascular: Negative for chest pain and leg swelling.  Gastrointestinal: Negative for abdominal pain, blood in stool, diarrhea, melena, nausea and vomiting.  Genitourinary: Negative for dysuria and hematuria.  Musculoskeletal: Positive for joint pain.  Skin: Negative for itching and rash.  Neurological: Positive for weakness. Negative for dizziness, focal weakness and loss of consciousness.  Psychiatric/Behavioral: Negative for depression. The patient does not have insomnia.     DRUG ALLERGIES:   Allergies  Allergen Reactions  . Penicillins Swelling and Other (See Comments)    Pt states that her tongue swells and turns white.     VITALS:  Blood pressure (!) 106/54, pulse (!) 57, temperature 98.1 F (36.7 C), temperature source Oral, resp. rate 18, height 6' (1.829 m), weight 206 lb (93.4 kg), SpO2 97 %. PHYSICAL EXAMINATION:  Physical Exam  Constitutional: She is oriented to person, place, and time and well-developed, well-nourished, and in no distress.  HENT:  Head: Normocephalic.  Mouth/Throat: Oropharynx is clear and moist.  Eyes: Conjunctivae and EOM are normal.  Neck: Normal range of motion. Neck supple. No JVD present. No tracheal deviation present.  Cardiovascular: Normal rate, regular rhythm and normal heart sounds.  Exam reveals no gallop.   No murmur heard. Pulmonary/Chest: Effort normal and breath sounds normal. No respiratory distress. She  has no wheezes. She has no rales.  Abdominal: Soft. Bowel sounds are normal. She exhibits no distension. There is no tenderness. There is no rebound.  Musculoskeletal: She exhibits no edema or tenderness.  Neurological: She is alert and oriented to person, place, and time.  Skin: No rash noted. No erythema.  Psychiatric: Affect normal.   LABORATORY PANEL:  Female CBC  Recent Labs Lab 04/30/16 0558  WBC 2.3*  HGB 11.6*  HCT 35.4  PLT 110*   ------------------------------------------------------------------------------------------------------------------ Chemistries   Recent Labs Lab 04/30/16 0558  NA 138  K 4.3  CL 107  CO2 29  GLUCOSE 85  BUN 31*  CREATININE 1.44*  CALCIUM 8.9   RADIOLOGY:  Dg Knee Complete 4 Views Left  Result Date: 04/29/2016 CLINICAL DATA:  Pain after fall from couch today EXAM: LEFT KNEE - COMPLETE 4+ VIEW COMPARISON:  None. FINDINGS: Medial femorotibial joint space narrowing or trace joint effusion. No acute fracture or malalignment. Vascular clips are seen medially within the proximal leg. There is mild soft tissue induration along the lateral aspect of the thigh and knee. 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 No acute osseous abnormality. Trace joint effusion. Electronically Signed   By: Tollie Eth M.D.   On: 04/29/2016 23:08   Dg Hip Unilat W Or Wo Pelvis 2-3 Views Left  Result Date: 04/29/2016 CLINICAL DATA:  Pain after fall EXAM: DG HIP (WITH OR WITHOUT PELVIS) 2-3V LEFT COMPARISON:  06/15/2014 FINDINGS: There is an undisplaced linear lucency involving the superior pubic ramus near the pubic symphysis extending to the inferior  pubic ramus. Findings are consistent with an acute nondisplaced fracture. There appear to be old posttraumatic deformity of the inferior pubic rami bilaterally as well. There is an intact appearing uncemented left total  hip arthroplasty. No hardware failure or malalignment. The bony pelvis is otherwise unremarkable apart from bilateral tubal ligation clips. IMPRESSION: Nondisplaced linear lucency involving the left parasymphysis consistent with an acute fracture. Electronically Signed   By: Tollie Eth M.D.   On: 04/29/2016 23:14   ASSESSMENT AND PLAN:   Pelvic fracture  pain control, orthopedics consult, Dr. Rosita Kea Suggested PT and OT evaluations PT  evaluation suggest a home health and PT.  Acute renal failure due to dehydration. Start normal saline IV and follow-up BMP.    HTN (hypertension) - continue home meds   CAD (coronary artery disease) - continue home medications   HLD (hyperlipidemia) - home dose anti lipids   GERD (gastroesophageal reflux disease) - home dose PPI Chronic leukopenia. Stable.  All the records are reviewed and case discussed with Care Management/Social Worker. Management plans discussed with the patient, family and they are in agreement.  CODE STATUS: Full Code  TOTAL TIME TAKING CARE OF THIS PATIENT: 28 minutes.   More than 50% of the time was spent in counseling/coordination of care: YES  POSSIBLE D/C IN 1 DAYS, DEPENDING ON CLINICAL CONDITION.   Shaune Pollack M.D on 04/30/2016 at 1:53 PM  Between 7am to 6pm - Pager - [phone]  After 6pm go to [email or web address] - password [email or web address] [facility]  [facility] [place] [facility]  Office  [phone]  CC: Primary care physician; Dortha Kern, MD  Note: This dictation was prepared with Dragon dictation along with smaller phrase technology. Any transcriptional errors that result from this process are unintentional.

## 2016-04-30 NOTE — Progress Notes (Signed)
PT Cancellation Note  Patient Details Name: Cynthia [name] MRN: [id number] DOB: 1940/04/01   Cancelled Treatment:    Reason Eval/Treat Not Completed: Patient's level of consciousness.  Did not respond to PT and will try later as pt is able to be assessed.   Ivar Drape 04/30/2016, 10:24 AM   Samul Dada, PT MS Acute Rehab Dept. Number: [facility] [id number] and [facility] [phone]

## 2016-04-30 NOTE — Evaluation (Signed)
Occupational Therapy Evaluation Patient Details Name: Cynthia [name] MRN: [id number] DOB: [birth year] Today's Date: 04/30/2016    History of Present Illness Patient reports she was at home with her husband and went to get out of bed and fell, she could not get herself up, reports she used her lifeline button to call for help.  She was transported to [facility] via EMS.  She reports left side pain at hip and right side shoulder pain.  Per MD: X-rays reviewed and shows nondisplaced fracture left-sided pubic rami, no surgical intervention at this time.    Clinical Impression   Patient seen for OT evaluation this date.  She presents with muscle weakness, decreased ability to perform functional transfers, functional mobility and decreased ability to perform self care tasks.  She would benefit from skilled OT to maximize her safety and independence in daily tasks.  She may require STR rehab at discharge to increase independence to be able to return home with husband.     Follow Up Recommendations  SNF    Equipment Recommendations       Recommendations for Other Services       Precautions / Restrictions Precautions Precautions: Fall Restrictions Weight Bearing Restrictions: Yes LLE Weight Bearing: Weight bearing as tolerated      Mobility Bed Mobility Overal bed mobility: Needs Assistance Bed Mobility: Supine to Sit;Sit to Supine     Supine to sit: Min assist Sit to supine: Min assist      Transfers Overall transfer level: Needs assistance   Transfers: Sit to/from Stand Sit to Stand: Min assist              Balance                                           ADL either performed or assessed with clinical judgement   ADL Overall ADL's : Needs assistance/impaired Eating/Feeding: Modified independent   Grooming: Set up;Sitting   Upper Body Bathing: Set up;Minimal assistance   Lower Body Bathing: Set up;Moderate assistance   Upper Body Dressing : Set  up;Minimal assistance   Lower Body Dressing: Maximal assistance;Set up   Toilet Transfer: Minimal assistance;BSC   Toileting- Clothing Manipulation and Hygiene: Maximal assistance Toileting - Clothing Manipulation Details (indicate cue type and reason): max assist to manage diaper             Vision         Perception     Praxis      Pertinent Vitals/Pain Pain Assessment: 0-10 Pain Score: 8  Pain Location: left hip Pain Descriptors / Indicators: Aching Pain Intervention(s): Limited activity within patient's tolerance;Monitored during session;Repositioned     Hand Dominance Right   Extremity/Trunk Assessment Upper Extremity Assessment Upper Extremity Assessment: Generalized weakness;RUE deficits/detail RUE Deficits / Details: patient reports pain at times with right shoulder    Lower Extremity Assessment Lower Extremity Assessment: Defer to PT evaluation       Communication Communication Communication: No difficulties   Cognition Arousal/Alertness: Awake/alert Behavior During Therapy: WFL for tasks assessed/performed Overall Cognitive Status: Within Functional Limits for tasks assessed                                     General Comments  Balance good during bedside commode transfer    Exercises  Shoulder Instructions      Home Living Family/patient expects to be discharged to:: Private residence Living Arrangements: Spouse/significant other Available Help at Discharge: Family Type of Home: Apartment Home Access: Stairs to enter Secretary/administrator of Steps: 4 Entrance Stairs-Rails: Can reach both Home Layout: One level     Bathroom Shower/Tub: Tub/shower unit;Curtain   Firefighter: Handicapped height     Home Equipment: Grab bars - tub/shower;Walker - 4 wheels;Walker - 2 wheels          Prior Functioning/Environment Level of Independence: Needs assistance  Gait / Transfers Assistance Needed: Reports she  ambulated with a walker when [facility]. ADL's / Homemaking Assistance Needed: She recently had a home health aide who was coming 5 days a week to help with basic self care tasks, husband assists with light homemaking and meal prep.              OT Problem List: Decreased strength;Pain;Decreased range of motion;Decreased activity tolerance;Decreased knowledge of use of DME or AE      OT Treatment/Interventions: Self-care/ADL training;DME and/or AE instruction;Therapeutic activities;Therapeutic exercise;Patient/family education    OT Goals(Current goals can be found in the care plan section) Acute Rehab OT Goals Patient Stated Goal: Patient reports she wants to do what she did before and wants to go back to the [facility] again. OT Goal Formulation: With patient Time For Goal Achievement: 05/14/16 Potential to Achieve Goals: Good  OT Frequency: Min 1X/week   Barriers to D/C:            Co-evaluation              End of Session Equipment Utilized During Treatment: Gait belt Nurse Communication: Mobility status  Activity Tolerance: Patient tolerated treatment well;Patient limited by pain Patient left: in bed;with call bell/phone within reach;with bed alarm set  OT Visit Diagnosis: Muscle weakness (generalized) (M62.81);Other abnormalities of gait and mobility (R26.89);Pain Pain - Right/Left: Left Pain - part of body: Hip                Time: 1030-1105 OT Time Calculation (min): 35 min Charges:  OT General Charges $OT Visit: 1 Procedure OT Evaluation $OT Eval Low Complexity: 1 Procedure OT Treatments $Self Care/Home Management : 8-22 mins G-Codes: OT G-codes **NOT FOR INPATIENT CLASS** Functional Assessment Tool Used: AM-PAC 6 Clicks Daily Activity;Clinical judgement Functional Limitation: Self care Self Care Current Status (Z6109): At least 40 percent but less than 60 percent impaired, limited or restricted Self Care Goal Status (U0454): At least  20 percent but less than 40 percent impaired, limited or restricted   Cynthia [name] T Cynthia [name], OTR/L, CLT   Cynthia [name] 04/30/2016, 11:26 AM

## 2016-04-30 NOTE — Progress Notes (Signed)
Spoke to Dr Imogene Burn in person, is reviewing orders for medications.

## 2016-04-30 NOTE — Progress Notes (Signed)
IV infiltrated. IV removed, new IV in different site placed. Ice pack applied to left arm at site of infiltration.

## 2016-04-30 NOTE — Progress Notes (Signed)
Dr Imogene Burn paged x2. BP 106/54, HR 57. Meds scheduled this am metoprolol, imdur, may need to possibly hold this am. Waiting on callback.

## 2016-05-01 LAB — BASIC METABOLIC PANEL
Anion gap: 4 — ABNORMAL LOW (ref 5–15)
BUN: 23 mg/dL — AB (ref 6–20)
CHLORIDE: 106 mmol/L (ref 101–111)
CO2: 28 mmol/L (ref 22–32)
Calcium: 9.1 mg/dL (ref 8.9–10.3)
Creatinine, Ser: 1.1 mg/dL — ABNORMAL HIGH (ref 0.44–1.00)
GFR calc Af Amer: 55 mL/min — ABNORMAL LOW (ref >60)
GFR calc non Af Amer: 48 mL/min — ABNORMAL LOW (ref >60)
GLUCOSE: 86 mg/dL (ref 65–99)
POTASSIUM: 4.5 mmol/L (ref 3.5–5.1)
Sodium: 138 mmol/L (ref 135–145)

## 2016-05-01 NOTE — Progress Notes (Signed)
Pt is alert and oriented. Medicated for pain this shift with good results. Iv infusing without difficulty. Ambulating to the bathroom with assistance. Able to sleep in between care.

## 2016-05-01 NOTE — Plan of Care (Signed)
Problem: Pain Managment: Goal: General experience of comfort will improve Outcome: Progressing Pain controled with oral pain medication.  Problem: Activity: Goal: Risk for activity intolerance will decrease Outcome: Progressing Pt is getting up and ambulating to the bathroom with assistance.  Problem: Nutrition: Goal: Adequate nutrition will be maintained Outcome: Progressing Eating and drinking without difficulty.

## 2016-05-01 NOTE — Discharge Summary (Signed)
[place] [facility] [place] NAME: Cynthia [name]    MR#:  [id number]  DATE OF BIRTH:  [birth year]  DATE OF ADMISSION:  04/29/2016   ADMITTING PHYSICIAN: Lance Coon, MD  DATE OF DISCHARGE: 05/01/2016 12:00 PM  PRIMARY CARE PHYSICIAN: Lynnell Jude, MD   ADMISSION DIAGNOSIS:  Fall, initial encounter [W19.XXXA] Acute pain of left knee [M25.562] Pain of left hip joint [M25.552] DISCHARGE DIAGNOSIS:  Principal Problem:   Pelvic fracture (HCC) Active Problems:   HTN (hypertension)   HLD (hyperlipidemia)   GERD (gastroesophageal reflux disease)   CAD (coronary artery disease) ARF due to dehydration. SECONDARY DIAGNOSIS:   Past Medical History:  Diagnosis Date  . Blood clot in vein   . Coronary artery disease   . GERD (gastroesophageal reflux disease)   . Heart attack   . Hx of CABG 2013  . Hyperlipidemia   . Hypertension    HOSPITAL COURSE:  Pelvic fracture  pain control, orthopedics consult, Dr. Rudene Christians Suggested PT and OT evaluations PT  evaluation suggest a home health and PT.  Acute renal failure due to dehydration. Improved with IV fluid support.  HTN (hypertension) - continue home meds CAD (coronary artery disease) - continue home medications HLD (hyperlipidemia) - home dose antilipids GERD (gastroesophageal reflux disease) - home dose PPI Chronic leukopenia. Stable.  DISCHARGE CONDITIONS:  Stable, discharge to home with home health and PT today. CONSULTS OBTAINED:  Treatment Team:  Hessie Knows, MD DRUG ALLERGIES:   Allergies  Allergen Reactions  . Penicillins Swelling and Other (See Comments)    Pt states that her tongue swells and turns white.     DISCHARGE MEDICATIONS:   Allergies as of 05/01/2016      Reactions   Penicillins Swelling, Other (See Comments)   Pt states that her tongue swells and turns white.        Medication List    TAKE these medications   albuterol (2.5 MG/3ML) 0.083% nebulizer  solution Commonly known as:  PROVENTIL Take 2.5 mg by nebulization every 4 (four) hours as needed for wheezing or shortness of breath.   albuterol 108 (90 Base) MCG/ACT inhaler Commonly known as:  PROVENTIL HFA;VENTOLIN HFA Inhale 1-2 puffs into the lungs every 4 (four) hours as needed for wheezing or shortness of breath.   aspirin EC 81 MG tablet Take 81 mg by mouth daily.   calcium carbonate 500 MG chewable tablet Commonly known as:  TUMS - dosed in mg elemental calcium Chew 2 tablets by mouth 4 (four) times daily as needed for indigestion or heartburn.   clopidogrel 75 MG tablet Commonly known as:  PLAVIX Take 75 mg by mouth daily.   dextromethorphan-guaiFENesin 10-100 MG/5ML liquid Commonly known as:  ROBITUSSIN-DM Take 20 mLs by mouth every 6 (six) hours as needed for cough.   dextromethorphan-guaiFENesin 30-600 MG 12hr tablet Commonly known as:  MUCINEX DM Take 1 tablet by mouth 2 (two) times daily as needed for cough.   diclofenac sodium 1 % Gel Commonly known as:  VOLTAREN Apply 2 g topically 4 (four) times daily as needed (pain). What changed:  Another medication with the same name was added. Make sure you understand how and when to take each.   diclofenac sodium 1 % Gel Commonly known as:  VOLTAREN Apply 2 g topically 4 (four) times daily. What changed:  You were already taking a medication with the same name, and this prescription was added. Make sure you understand how  and when to take each.   DULoxetine 60 MG capsule Commonly known as:  CYMBALTA Take 60 mg by mouth daily.   enalapril 2.5 MG tablet Commonly known as:  VASOTEC Take 2.5 mg by mouth 2 (two) times daily.   ferrous sulfate 325 (65 FE) MG tablet Take 325 mg by mouth 2 (two) times daily.   furosemide 40 MG tablet Commonly known as:  LASIX Take 40 mg by mouth daily.   hydrALAZINE 25 MG tablet Commonly known as:  APRESOLINE Take 25 mg by mouth daily.   isosorbide mononitrate 30 MG 24 hr  tablet Commonly known as:  IMDUR Take 30 mg by mouth daily.   latanoprost 0.005 % ophthalmic solution Commonly known as:  XALATAN Place 1 drop into both eyes at bedtime.   loperamide 2 MG capsule Commonly known as:  IMODIUM Take 2-4 mg by mouth every 6 (six) hours as needed for diarrhea or loose stools. Take 2 capsules after first loose stool, then 1 capsule every 6 hours as needed. No more than 6 capsules per day.   magnesium hydroxide 400 MG/5ML suspension Commonly known as:  MILK OF MAGNESIA Take 15-30 mLs by mouth daily as needed for mild constipation.   metoprolol succinate 25 MG 24 hr tablet Commonly known as:  TOPROL-XL Take 25 mg by mouth daily.   naloxone 4 MG/0.1ML Liqd nasal spray kit Commonly known as:  NARCAN Place 1 spray into the nose once as needed (narcotic overdose).   nitroGLYCERIN 0.3 MG SL tablet Commonly known as:  NITROSTAT Place 0.3 mg under the tongue every 5 (five) minutes as needed for chest pain.   ondansetron 4 MG tablet Commonly known as:  ZOFRAN Take 4 mg by mouth 3 (three) times daily as needed for nausea or vomiting.   oxyCODONE-acetaminophen 5-325 MG tablet Commonly known as:  PERCOCET/ROXICET Take 1 tablet by mouth 2 (two) times daily as needed for severe pain.   pravastatin 20 MG tablet Commonly known as:  PRAVACHOL Take 20 mg by mouth at bedtime.   predniSONE 20 MG tablet Commonly known as:  DELTASONE Take 20 mg by mouth 3 (three) times daily as needed (breathing problems).   ranitidine 150 MG tablet Commonly known as:  ZANTAC Take 1 tablet (150 mg total) by mouth at bedtime.   senna-docusate 8.6-50 MG tablet Commonly known as:  Senokot-S Take 1 tablet by mouth daily.   tiotropium 18 MCG inhalation capsule Commonly known as:  SPIRIVA Place 18 mcg into inhaler and inhale daily.   traZODone 50 MG tablet Commonly known as:  DESYREL Take 50 mg by mouth at bedtime.   Vitamin D (Ergocalciferol) 50000 units Caps  capsule Commonly known as:  DRISDOL Take 50,000 Units by mouth every 30 (thirty) days. Take on the first day of each month.        DISCHARGE INSTRUCTIONS:  See AVS.  If you experience worsening of your admission symptoms, develop shortness of breath, life threatening emergency, suicidal or homicidal thoughts you must seek medical attention immediately by calling 911 or calling your MD immediately  if symptoms less severe.  You Must read complete instructions/literature along with all the possible adverse reactions/side effects for all the Medicines you take and that have been prescribed to you. Take any new Medicines after you have completely understood and accpet all the possible adverse reactions/side effects.   Please note  You were cared for by a hospitalist during your hospital stay. If you have any questions about your  discharge medications or the care you received while you were in the hospital after you are discharged, you can call the unit and asked to speak with the hospitalist on call if the hospitalist that took care of you is not available. Once you are discharged, your primary care physician will handle any further medical issues. Please note that NO REFILLS for any discharge medications will be authorized once you are discharged, as it is imperative that you return to your primary care physician (or establish a relationship with a primary care physician if you do not have one) for your aftercare needs so that they can reassess your need for medications and monitor your lab values.    On the day of Discharge:  VITAL SIGNS:  Blood pressure (!) 157/81, pulse 63, temperature 98.3 F (36.8 C), temperature source Oral, resp. rate 18, height 6' (1.829 m), weight 206 lb (93.4 kg), SpO2 93 %. PHYSICAL EXAMINATION:  GENERAL:  76 y.o.-year-old patient lying in the bed with no acute distress.  EYES: Pupils equal, round, reactive to light and accommodation. No scleral icterus.  Extraocular muscles intact.  HEENT: Head atraumatic, normocephalic. Oropharynx and nasopharynx clear.  NECK:  Supple, no jugular venous distention. No thyroid enlargement, no tenderness.  LUNGS: Normal breath sounds bilaterally, no wheezing, rales,rhonchi or crepitation. No use of accessory muscles of respiration.  CARDIOVASCULAR: S1, S2 normal. No murmurs, rubs, or gallops.  ABDOMEN: Soft, non-tender, non-distended. Bowel sounds present. No organomegaly or mass.  EXTREMITIES: No pedal edema, cyanosis, or clubbing.  NEUROLOGIC: Cranial nerves II through XII are intact. Muscle strength 5/5 in all extremities. Sensation intact. Gait not checked.  PSYCHIATRIC: The patient is alert and oriented x 3.  SKIN: No obvious rash, lesion, or ulcer.  DATA REVIEW:   CBC  Recent Labs Lab 04/30/16 0558  WBC 2.3*  HGB 11.6*  HCT 35.4  PLT 110*    Chemistries   Recent Labs Lab 05/01/16 0441  NA 138  K 4.5  CL 106  CO2 28  GLUCOSE 86  BUN 23*  CREATININE 1.10*  CALCIUM 9.1     Microbiology Results  Results for orders placed or performed in visit on 06/08/13  Urine culture     Status: None   Collection Time: 06/09/13  1:57 PM  Result Value Ref Range Status   Micro Text Report   Final       ORGANISM 1                1000 CFU/ML GRAM NEGATIVE ROD   ANTIBIOTIC                                                        RADIOLOGY:  No results found.   Management plans discussed with the patient, family and they are in agreement.  CODE STATUS: Full Code   TOTAL TIME TAKING CARE OF THIS PATIENT: 26 minutes.    Demetrios Loll M.D on 05/01/2016 at 2:54 PM  Between 7am to 6pm - Pager - [phone]  After 6pm go to [email or web address] - password [email or web address] [facility]  [employer] [place] [place] [facility]  Office  [phone]  CC: Primary care physician; Lynnell Jude, MD   Note: This dictation was prepared with Dragon dictation along with smaller phrase technology. Any transcriptional errors  that result from this process are  unintentional. 

## 2016-05-01 NOTE — Care Management Note (Signed)
Case Management Note  Patient Details  Name: Cynthia [name] MRN: [id number] Date of Birth: Jun 01, 1940  Subjective/Objective:    A referral for home health PT and RN was called to She[facility] at [facility].  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 [name] has a pubic fracture.               Action/Plan:   Expected Discharge Date:  05/01/16               Expected Discharge Plan:     In-House Referral:     Discharge planning Services     Post Acute Care Choice:    Choice offered to:     DME Arranged:    DME Agency:     HH Arranged:    HH Agency:     Status of Service:     If discussed at [employer], dates discussed:    Additional Comments:  [name] A, RN 05/01/2016, 11:31 AM

## 2016-05-01 NOTE — Discharge Instructions (Signed)
Please seek medical attention for any high fevers, chest pain, shortness of breath, change in behavior, persistent vomiting, bloody stool or any other new or concerning symptoms.  Fall precaution.

## 2016-09-27 ENCOUNTER — Encounter: Payer: Self-pay | Admitting: Emergency Medicine

## 2016-09-27 ENCOUNTER — Emergency Department
Admission: EM | Admit: 2016-09-27 | Discharge: 2016-09-27 | Disposition: A | Discharge status: Home / Self Care | Payer: Medicare (Managed Care) | Attending: Emergency Medicine | Admitting: Emergency Medicine

## 2016-09-27 ENCOUNTER — Emergency Department: Payer: Medicare (Managed Care)

## 2016-09-27 DIAGNOSIS — R1030 Lower abdominal pain, unspecified: Secondary | ICD-10-CM

## 2016-09-27 DIAGNOSIS — I2510 Atherosclerotic heart disease of native coronary artery without angina pectoris: Secondary | ICD-10-CM | POA: Insufficient documentation

## 2016-09-27 DIAGNOSIS — Z79899 Other long term (current) drug therapy: Secondary | ICD-10-CM | POA: Diagnosis not present

## 2016-09-27 DIAGNOSIS — R1032 Left lower quadrant pain: Secondary | ICD-10-CM | POA: Diagnosis not present

## 2016-09-27 DIAGNOSIS — Z87891 Personal history of nicotine dependence: Secondary | ICD-10-CM | POA: Diagnosis not present

## 2016-09-27 DIAGNOSIS — I10 Essential (primary) hypertension: Secondary | ICD-10-CM | POA: Diagnosis not present

## 2016-09-27 DIAGNOSIS — N390 Urinary tract infection, site not specified: Secondary | ICD-10-CM | POA: Diagnosis not present

## 2016-09-27 DIAGNOSIS — Z7982 Long term (current) use of aspirin: Secondary | ICD-10-CM | POA: Diagnosis not present

## 2016-09-27 LAB — COMPREHENSIVE METABOLIC PANEL
ALBUMIN: 4.3 g/dL (ref 3.5–5.0)
ALK PHOS: 107 U/L (ref 38–126)
ALT: 16 U/L (ref 14–54)
ANION GAP: 7 (ref 5–15)
AST: 22 U/L (ref 15–41)
BUN: 17 mg/dL (ref 6–20)
CALCIUM: 9.2 mg/dL (ref 8.9–10.3)
CHLORIDE: 106 mmol/L (ref 101–111)
CO2: 29 mmol/L (ref 22–32)
Creatinine, Ser: 0.97 mg/dL (ref 0.44–1.00)
GFR calc non Af Amer: 56 mL/min — ABNORMAL LOW (ref >60)
GLUCOSE: 83 mg/dL (ref 65–99)
POTASSIUM: 4.4 mmol/L (ref 3.5–5.1)
SODIUM: 142 mmol/L (ref 135–145)
Total Bilirubin: 1.5 mg/dL — ABNORMAL HIGH (ref 0.3–1.2)
Total Protein: 7.3 g/dL (ref 6.5–8.1)

## 2016-09-27 LAB — URINALYSIS, COMPLETE (UACMP) WITH MICROSCOPIC
BACTERIA UA: NONE SEEN
Bilirubin Urine: NEGATIVE
GLUCOSE, UA: NEGATIVE mg/dL
HGB URINE DIPSTICK: NEGATIVE
KETONES UR: NEGATIVE mg/dL
NITRITE: NEGATIVE
PROTEIN: 30 mg/dL — AB
Specific Gravity, Urine: 1.026 (ref 1.005–1.030)
pH: 5 (ref 5.0–8.0)

## 2016-09-27 LAB — DIFFERENTIAL
Basophils Absolute: 0 10*3/uL (ref 0–0.1)
Basophils Relative: 1 %
EOS ABS: 0.1 10*3/uL (ref 0–0.7)
Eosinophils Relative: 3 %
LYMPHS PCT: 30 %
Lymphs Abs: 0.8 10*3/uL — ABNORMAL LOW (ref 1.0–3.6)
MONO ABS: 0.4 10*3/uL (ref 0.2–0.9)
Monocytes Relative: 16 %
NEUTROS PCT: 50 %
Neutro Abs: 1.3 10*3/uL — ABNORMAL LOW (ref 1.4–6.5)

## 2016-09-27 LAB — CBC
HEMATOCRIT: 37.6 % (ref 35.0–47.0)
Hemoglobin: 12.7 g/dL (ref 12.0–16.0)
MCH: 34.1 pg — ABNORMAL HIGH (ref 26.0–34.0)
MCHC: 33.8 g/dL (ref 32.0–36.0)
MCV: 101 fL — ABNORMAL HIGH (ref 80.0–100.0)
PLATELETS: 137 10*3/uL — AB (ref 150–440)
RBC: 3.73 MIL/uL — ABNORMAL LOW (ref 3.80–5.20)
RDW: 14.9 % — AB (ref 11.5–14.5)
WBC: 2.5 10*3/uL — ABNORMAL LOW (ref 3.6–11.0)

## 2016-09-27 LAB — LIPASE, BLOOD: Lipase: 20 U/L (ref 11–51)

## 2016-09-27 MED ORDER — HYDROCODONE-ACETAMINOPHEN 5-325 MG PO TABS
1.0000 | ORAL_TABLET | Freq: Once | ORAL | Status: AC
  Administered 2016-09-27: 1 via ORAL
  Filled 2016-09-27: qty 1

## 2016-09-27 MED ORDER — NITROFURANTOIN MONOHYD MACRO 100 MG PO CAPS: 100.0000 mg | ORAL_CAPSULE | Freq: Two times a day (BID) | ORAL | 0 refills | Status: AC

## 2016-09-27 MED ORDER — IOPAMIDOL (ISOVUE-300) INJECTION 61%
100.0000 mL | Freq: Once | INTRAVENOUS | Status: AC | PRN
  Administered 2016-09-27: 100 mL via INTRAVENOUS

## 2016-09-27 MED ORDER — IOPAMIDOL (ISOVUE-300) INJECTION 61%
30.0000 mL | Freq: Once | INTRAVENOUS | Status: AC | PRN
  Administered 2016-09-27: 30 mL via ORAL

## 2016-09-27 NOTE — ED Triage Notes (Signed)
Patient presents to ED via [facility] from pcp office with c/o lower abdominal pain. Patient denies vomiting but does report nausea. Patient guarding middle, lower abdomen.

## 2016-09-27 NOTE — ED Notes (Signed)
Patient made aware of need of urine sample. States she is unable to void at this time.  

## 2016-09-27 NOTE — ED Provider Notes (Signed)
[facility] Emergency Department Provider Note   ____________________________________________    I have reviewed the triage vital signs and the nursing notes.   HISTORY  Chief Complaint Abdominal Pain     HPI Cynthia [name] is a 76 y.o. female who presents with complaints of abdominal pain. Patient complains of left lower quadrant cramping abdominal pain which started abruptly today while she was at her therapist's office. She denies radiation of the pain. She says she has never had this before. No history of abdominal surgery. No nausea or vomiting. Normal stools. No fevers chills. No sick contacts reported.   Past Medical History:  Diagnosis Date  . Blood clot in vein   . Coronary artery disease   . GERD (gastroesophageal reflux disease)   . Heart attack (HCC)   . Hx of CABG 2013  . Hyperlipidemia   . Hypertension     Patient Active Problem List   Diagnosis Date Noted  . Pelvic fracture (HCC) 04/30/2016  . HTN (hypertension) 04/30/2016  . HLD (hyperlipidemia) 04/30/2016  . GERD (gastroesophageal reflux disease) 04/30/2016  . CAD (coronary artery disease) 04/30/2016  . Chest pain 09/08/2014  . Dehydration 09/08/2014    Past Surgical History:  Procedure Laterality Date  . CARDIAC SURGERY    . HIP SURGERY Left 12/2014    Prior to Admission medications   Medication Sig Start Date End Date Taking? Authorizing Provider  albuterol (PROVENTIL HFA;VENTOLIN HFA) 108 (90 Base) MCG/ACT inhaler Inhale 1-2 puffs into the lungs every 4 (four) hours as needed for wheezing or shortness of breath.    [provider]  albuterol (PROVENTIL) (2.5 MG/3ML) 0.083% nebulizer solution Take 2.5 mg by nebulization every 4 (four) hours as needed for wheezing or shortness of breath.    [provider]  aspirin EC 81 MG tablet Take 81 mg by mouth daily.    [provider]  calcium carbonate (TUMS - DOSED IN MG ELEMENTAL CALCIUM)  500 MG chewable tablet Chew 2 tablets by mouth 4 (four) times daily as needed for indigestion or heartburn.    [provider]  clopidogrel (PLAVIX) 75 MG tablet Take 75 mg by mouth daily.    [provider]  dextromethorphan-guaiFENesin (MUCINEX DM) 30-600 MG 12hr tablet Take 1 tablet by mouth 2 (two) times daily as needed for cough.    [provider]  dextromethorphan-guaiFENesin (ROBITUSSIN-DM) 10-100 MG/5ML liquid Take 20 mLs by mouth every 6 (six) hours as needed for cough.    [provider]  diclofenac sodium (VOLTAREN) 1 % GEL Apply 2 g topically 4 (four) times daily. 04/29/16   Phineas Semen, MD  diclofenac sodium (VOLTAREN) 1 % GEL Apply 2 g topically 4 (four) times daily as needed (pain).    [provider]  DULoxetine (CYMBALTA) 60 MG capsule Take 60 mg by mouth daily.    [provider]  enalapril (VASOTEC) 2.5 MG tablet Take 2.5 mg by mouth 2 (two) times daily.    [provider]  ferrous sulfate 325 (65 FE) MG tablet Take 325 mg by mouth 2 (two) times daily.    [provider]  furosemide (LASIX) 40 MG tablet Take 40 mg by mouth daily.     [provider]  hydrALAZINE (APRESOLINE) 25 MG tablet Take 25 mg by mouth daily.     [provider]  isosorbide mononitrate (IMDUR) 30 MG 24 hr tablet Take 30 mg by mouth daily.    [provider]  latanoprost (XALATAN) 0.005 % ophthalmic solution Place 1 drop into both eyes at bedtime.    [provider]  loperamide (IMODIUM) 2 MG capsule Take 2-4 mg by mouth every 6 (six) hours as needed for diarrhea or loose stools. Take 2 capsules after first loose stool, then 1 capsule every 6 hours as needed. No more than 6 capsules per day.    [provider]  magnesium hydroxide (MILK OF MAGNESIA) 400 MG/5ML suspension Take 15-30 mLs by mouth daily as needed for mild constipation.    [provider]  metoprolol succinate (TOPROL-XL)  25 MG 24 hr tablet Take 25 mg by mouth daily.    [provider]  naloxone [facility]) nasal spray 4 mg/0.1 mL Place 1 spray into the nose once as needed (narcotic overdose).     [provider]  nitrofurantoin, macrocrystal-monohydrate, (MACROBID) 100 MG capsule Take 1 capsule (100 mg total) by mouth 2 (two) times daily. 09/27/16 10/04/16  Jene Every, MD  nitroGLYCERIN (NITROSTAT) 0.3 MG SL tablet Place 0.3 mg under the tongue every 5 (five) minutes as needed for chest pain.    [provider]  ondansetron (ZOFRAN) 4 MG tablet Take 4 mg by mouth 3 (three) times daily as needed for nausea or vomiting.    [provider]  oxyCODONE-acetaminophen (PERCOCET/ROXICET) 5-325 MG tablet Take 1 tablet by mouth 2 (two) times daily as needed for severe pain.    [provider]  pravastatin (PRAVACHOL) 20 MG tablet Take 20 mg by mouth at bedtime.    [provider]  predniSONE (DELTASONE) 20 MG tablet Take 20 mg by mouth 3 (three) times daily as needed (breathing problems).    [provider]  ranitidine (ZANTAC) 150 MG tablet Take 1 tablet (150 mg total) by mouth at bedtime. 06/10/15 06/09/16  Phineas Semen, MD  senna-docusate (SENOKOT-S) 8.6-50 MG tablet Take 1 tablet by mouth daily.    [provider]  tiotropium (SPIRIVA) 18 MCG inhalation capsule Place 18 mcg into inhaler and inhale daily.    [provider]  traZODone (DESYREL) 50 MG tablet Take 50 mg by mouth at bedtime.    [provider]  Vitamin D, Ergocalciferol, (DRISDOL) 50000 units CAPS capsule Take 50,000 Units by mouth every 30 (thirty) days. Take on the first day of each month.    [provider]     Allergies Penicillins  Family History  Problem Relation Age of Onset  . Hypertension Other   . Alcohol abuse Other     Social History Social History  Substance Use Topics  . Smoking status: Former Smoker    Packs/day: 0.50    Years: 35.00      Types: Cigarettes  . Smokeless tobacco: Never Used  . Alcohol use No    Review of Systems  Constitutional: No fever/chills Eyes: No visual changes.  ENT: No sore throat. Cardiovascular: Denies chest pain. Respiratory: Denies shortness of breath. Gastrointestinal: As above Genitourinary: Negative for dysuria. Musculoskeletal: Negative for back pain. Skin: Negative for rash. Neurological: Negative for headaches   ____________________________________________   PHYSICAL EXAM:  VITAL SIGNS: ED Triage Vitals  Enc Vitals Group     BP 09/27/16 1144 (!) 181/99     Pulse Rate 09/27/16 1200 70     Resp 09/27/16 1200 17     Temp 09/27/16 1143 98.2 F (36.8 C)     Temp Source 09/27/16 1143 Oral     SpO2 09/27/16 1200 92 %  Weight 09/27/16 1143 95.7 kg (211 lb)     Height 09/27/16 1143 1.829 m (6')     Head Circumference --      Peak Flow --      Pain Score 09/27/16 1143 9     Pain Loc --      Pain Edu? --      Excl. in GC? --     Constitutional: Alert and oriented. No acute distress.  Eyes: Conjunctivae are normal.   Nose: No congestion/rhinnorhea. Mouth/Throat: Mucous membranes are moist.   Neck:  Painless ROM Cardiovascular: Normal rate, regular rhythm. Grossly normal heart sounds.  Good peripheral circulation. Respiratory: Normal respiratory effort.  No retractions. Lungs CTAB. Gastrointestinal: Mild Terrace palpation left lower quadrant.. No distention.  No CVA tenderness. Genitourinary: deferred Musculoskeletal:.  Warm and well perfused Neurologic:  Normal speech and language. No gross focal neurologic deficits are appreciated.  Skin:  Skin is warm, dry and intact. No rash noted. Psychiatric: Mood and affect are normal. Speech and behavior are normal.  ____________________________________________   LABS (all labs ordered are listed, but only abnormal results are displayed)  Labs Reviewed  CBC - Abnormal; Notable for the following:       Result Value    WBC 2.5 (*)    RBC 3.73 (*)    MCV 101.0 (*)    MCH 34.1 (*)    RDW 14.9 (*)    Platelets 137 (*)    All other components within normal limits  COMPREHENSIVE METABOLIC PANEL - Abnormal; Notable for the following:    Total Bilirubin 1.5 (*)    GFR calc non Af Amer 56 (*)    All other components within normal limits  URINALYSIS, COMPLETE (UACMP) WITH MICROSCOPIC - Abnormal; Notable for the following:    Color, Urine AMBER (*)    APPearance CLEAR (*)    Protein, ur 30 (*)    Leukocytes, UA MODERATE (*)    Squamous Epithelial / LPF 0-5 (*)    All other components within normal limits  DIFFERENTIAL - Abnormal; Notable for the following:    Neutro Abs 1.3 (*)    Lymphs Abs 0.8 (*)    All other components within normal limits  LIPASE, BLOOD   ____________________________________________  EKG  None ____________________________________________  RADIOLOGY  CT scan negative for diverticulitis, cirrhosis lesion in the liver, discussed with patient's nurse practitioner ____________________________________________   PROCEDURES  Procedure(s) performed: No    Critical Care performed:No ____________________________________________   INITIAL IMPRESSION / ASSESSMENT AND PLAN / ED COURSE  Pertinent labs & imaging results that were available during my care of the patient were reviewed by me and considered in my medical decision making (see chart for details).  Patient presents with left lower quadrant abdominal pain, mild tenderness to palpation there. Otherwise exam reassuring. We will check labs and consider imaging.  CT scan is reassuring. Lab work consistent with UTI, decreased white blood cell count ANC is okay, only outpatient follow-up for this. We'll treat with Macrobid. Patient is feeling better and is stating that she is quite hungry    ____________________________________________   FINAL CLINICAL IMPRESSION(S) / ED DIAGNOSES  Final diagnoses:  Lower abdominal pain   Lower urinary tract infectious disease      NEW MEDICATIONS STARTED DURING THIS VISIT:  New Prescriptions   NITROFURANTOIN, MACROCRYSTAL-MONOHYDRATE, (MACROBID) 100 MG CAPSULE    Take 1 capsule (100 mg total) by mouth 2 (two) times daily.     Note:  This document  was prepared using Conservation officer, historic buildingsDragon voice recognition software and may include unintentional dictation errors.    Jene EveryKinner, Gabriela Irigoyen, MD 09/27/16 1438

## 2017-03-26 ENCOUNTER — Encounter: Payer: Self-pay | Admitting: *Deleted

## 2017-03-26 ENCOUNTER — Emergency Department: Payer: Medicare (Managed Care)

## 2017-03-26 ENCOUNTER — Other Ambulatory Visit: Payer: Self-pay

## 2017-03-26 ENCOUNTER — Inpatient Hospital Stay
Admission: EM | Admit: 2017-03-26 | Discharge: 2017-03-28 | DRG: 191 | Disposition: A | Discharge status: Home / Self Care | Payer: Medicare (Managed Care) | Attending: Internal Medicine | Admitting: Internal Medicine

## 2017-03-26 DIAGNOSIS — I452 Bifascicular block: Secondary | ICD-10-CM | POA: Diagnosis present

## 2017-03-26 DIAGNOSIS — M79601 Pain in right arm: Secondary | ICD-10-CM | POA: Diagnosis present

## 2017-03-26 DIAGNOSIS — Z716 Tobacco abuse counseling: Secondary | ICD-10-CM

## 2017-03-26 DIAGNOSIS — R0603 Acute respiratory distress: Secondary | ICD-10-CM | POA: Diagnosis not present

## 2017-03-26 DIAGNOSIS — I252 Old myocardial infarction: Secondary | ICD-10-CM

## 2017-03-26 DIAGNOSIS — Z880 Allergy status to penicillin: Secondary | ICD-10-CM | POA: Diagnosis not present

## 2017-03-26 DIAGNOSIS — Z79899 Other long term (current) drug therapy: Secondary | ICD-10-CM | POA: Diagnosis not present

## 2017-03-26 DIAGNOSIS — K219 Gastro-esophageal reflux disease without esophagitis: Secondary | ICD-10-CM | POA: Diagnosis present

## 2017-03-26 DIAGNOSIS — J441 Chronic obstructive pulmonary disease with (acute) exacerbation: Secondary | ICD-10-CM | POA: Diagnosis not present

## 2017-03-26 DIAGNOSIS — Z79891 Long term (current) use of opiate analgesic: Secondary | ICD-10-CM | POA: Diagnosis not present

## 2017-03-26 DIAGNOSIS — W19XXXA Unspecified fall, initial encounter: Secondary | ICD-10-CM | POA: Diagnosis present

## 2017-03-26 DIAGNOSIS — Z7982 Long term (current) use of aspirin: Secondary | ICD-10-CM | POA: Diagnosis not present

## 2017-03-26 DIAGNOSIS — E785 Hyperlipidemia, unspecified: Secondary | ICD-10-CM | POA: Diagnosis present

## 2017-03-26 DIAGNOSIS — Z8249 Family history of ischemic heart disease and other diseases of the circulatory system: Secondary | ICD-10-CM

## 2017-03-26 DIAGNOSIS — I10 Essential (primary) hypertension: Secondary | ICD-10-CM | POA: Diagnosis present

## 2017-03-26 DIAGNOSIS — Z951 Presence of aortocoronary bypass graft: Secondary | ICD-10-CM | POA: Diagnosis not present

## 2017-03-26 DIAGNOSIS — I2510 Atherosclerotic heart disease of native coronary artery without angina pectoris: Secondary | ICD-10-CM | POA: Diagnosis present

## 2017-03-26 DIAGNOSIS — M25511 Pain in right shoulder: Secondary | ICD-10-CM | POA: Diagnosis present

## 2017-03-26 DIAGNOSIS — F17210 Nicotine dependence, cigarettes, uncomplicated: Secondary | ICD-10-CM | POA: Diagnosis present

## 2017-03-26 DIAGNOSIS — Z7902 Long term (current) use of antithrombotics/antiplatelets: Secondary | ICD-10-CM

## 2017-03-26 DIAGNOSIS — D61818 Other pancytopenia: Secondary | ICD-10-CM | POA: Diagnosis present

## 2017-03-26 LAB — CBC WITH DIFFERENTIAL/PLATELET
Basophils Absolute: 0 10*3/uL (ref 0–0.1)
Basophils Relative: 1 %
EOS ABS: 0.1 10*3/uL (ref 0–0.7)
EOS PCT: 3 %
HCT: 35.5 % (ref 35.0–47.0)
HEMOGLOBIN: 11.5 g/dL — AB (ref 12.0–16.0)
Lymphocytes Relative: 35 %
Lymphs Abs: 0.9 10*3/uL — ABNORMAL LOW (ref 1.0–3.6)
MCH: 34.2 pg — ABNORMAL HIGH (ref 26.0–34.0)
MCHC: 32.6 g/dL (ref 32.0–36.0)
MCV: 105.1 fL — ABNORMAL HIGH (ref 80.0–100.0)
MONOS PCT: 20 %
Monocytes Absolute: 0.5 10*3/uL (ref 0.2–0.9)
NEUTROS PCT: 41 %
Neutro Abs: 1 10*3/uL — ABNORMAL LOW (ref 1.4–6.5)
PLATELETS: 122 10*3/uL — AB (ref 150–440)
RBC: 3.37 MIL/uL — AB (ref 3.80–5.20)
RDW: 14.1 % (ref 11.5–14.5)
WBC: 2.5 10*3/uL — AB (ref 3.6–11.0)

## 2017-03-26 LAB — COMPREHENSIVE METABOLIC PANEL
ALBUMIN: 4 g/dL (ref 3.5–5.0)
ALK PHOS: 135 U/L — AB (ref 38–126)
ALT: 21 U/L (ref 14–54)
ANION GAP: 7 (ref 5–15)
AST: 23 U/L (ref 15–41)
BUN: 25 mg/dL — ABNORMAL HIGH (ref 6–20)
CALCIUM: 8.6 mg/dL — AB (ref 8.9–10.3)
CHLORIDE: 105 mmol/L (ref 101–111)
CO2: 29 mmol/L (ref 22–32)
Creatinine, Ser: 1.29 mg/dL — ABNORMAL HIGH (ref 0.44–1.00)
GFR calc Af Amer: 45 mL/min — ABNORMAL LOW (ref >60)
GFR calc non Af Amer: 39 mL/min — ABNORMAL LOW (ref >60)
GLUCOSE: 105 mg/dL — AB (ref 65–99)
Potassium: 4.2 mmol/L (ref 3.5–5.1)
Sodium: 141 mmol/L (ref 135–145)
Total Bilirubin: 0.9 mg/dL (ref 0.3–1.2)
Total Protein: 6.9 g/dL (ref 6.5–8.1)

## 2017-03-26 LAB — BRAIN NATRIURETIC PEPTIDE: B Natriuretic Peptide: 1502 pg/mL — ABNORMAL HIGH (ref 0.0–100.0)

## 2017-03-26 LAB — TROPONIN I: Troponin I: <0.03 ng/mL (ref <0.03)

## 2017-03-26 LAB — INFLUENZA PANEL BY PCR (TYPE A & B)
INFLBPCR: NEGATIVE
Influenza A By PCR: NEGATIVE

## 2017-03-26 MED ORDER — ISOSORBIDE MONONITRATE ER 30 MG PO TB24
30.0000 mg | ORAL_TABLET | Freq: Every day | ORAL | Status: DC
  Administered 2017-03-27 – 2017-03-28 (×2): 30 mg via ORAL
  Filled 2017-03-26 (×3): qty 1

## 2017-03-26 MED ORDER — ENOXAPARIN SODIUM 40 MG/0.4ML ~~LOC~~ SOLN
40.0000 mg | SUBCUTANEOUS | Status: DC
  Administered 2017-03-26 – 2017-03-27 (×2): 40 mg via SUBCUTANEOUS
  Filled 2017-03-26 (×2): qty 0.4

## 2017-03-26 MED ORDER — DULOXETINE HCL 60 MG PO CPEP
60.0000 mg | ORAL_CAPSULE | Freq: Every day | ORAL | Status: DC
  Administered 2017-03-27 – 2017-03-28 (×2): 60 mg via ORAL
  Filled 2017-03-26 (×2): qty 1

## 2017-03-26 MED ORDER — METHYLPREDNISOLONE SODIUM SUCC 125 MG IJ SOLR
125.0000 mg | Freq: Once | INTRAMUSCULAR | Status: AC
  Administered 2017-03-26: 125 mg via INTRAVENOUS

## 2017-03-26 MED ORDER — METOPROLOL SUCCINATE ER 50 MG PO TB24: 25.0000 mg | ORAL_TABLET | Freq: Every day | ORAL | Status: DC

## 2017-03-26 MED ORDER — BUDESONIDE 0.5 MG/2ML IN SUSP
0.5000 mg | Freq: Two times a day (BID) | RESPIRATORY_TRACT | Status: DC
  Administered 2017-03-27 – 2017-03-28 (×3): 0.5 mg via RESPIRATORY_TRACT
  Filled 2017-03-26 (×3): qty 2

## 2017-03-26 MED ORDER — IPRATROPIUM-ALBUTEROL 0.5-2.5 (3) MG/3ML IN SOLN
3.0000 mL | Freq: Once | RESPIRATORY_TRACT | Status: AC
  Administered 2017-03-26: 3 mL via RESPIRATORY_TRACT

## 2017-03-26 MED ORDER — ACETAMINOPHEN 650 MG RE SUPP: 650.0000 mg | Freq: Four times a day (QID) | RECTAL | Status: DC | PRN

## 2017-03-26 MED ORDER — IPRATROPIUM-ALBUTEROL 0.5-2.5 (3) MG/3ML IN SOLN
3.0000 mL | Freq: Four times a day (QID) | RESPIRATORY_TRACT | Status: DC
  Administered 2017-03-27 – 2017-03-28 (×6): 3 mL via RESPIRATORY_TRACT
  Filled 2017-03-26 (×6): qty 3

## 2017-03-26 MED ORDER — METHYLPREDNISOLONE SODIUM SUCC 40 MG IJ SOLR
40.0000 mg | Freq: Every day | INTRAMUSCULAR | Status: DC
  Administered 2017-03-27 – 2017-03-28 (×2): 40 mg via INTRAVENOUS
  Filled 2017-03-26 (×2): qty 1

## 2017-03-26 MED ORDER — ASPIRIN EC 81 MG PO TBEC
81.0000 mg | DELAYED_RELEASE_TABLET | Freq: Every day | ORAL | Status: DC
  Administered 2017-03-27 – 2017-03-28 (×2): 81 mg via ORAL
  Filled 2017-03-26 (×2): qty 1

## 2017-03-26 MED ORDER — FUROSEMIDE 40 MG PO TABS
40.0000 mg | ORAL_TABLET | Freq: Every day | ORAL | Status: DC
  Administered 2017-03-27 – 2017-03-28 (×2): 40 mg via ORAL
  Filled 2017-03-26 (×2): qty 1

## 2017-03-26 MED ORDER — HYDRALAZINE HCL 50 MG PO TABS
25.0000 mg | ORAL_TABLET | Freq: Every day | ORAL | Status: DC
  Administered 2017-03-27 – 2017-03-28 (×2): 25 mg via ORAL
  Filled 2017-03-26 (×2): qty 1

## 2017-03-26 MED ORDER — LATANOPROST 0.005 % OP SOLN
1.0000 [drp] | Freq: Every day | OPHTHALMIC | Status: DC
  Administered 2017-03-26 – 2017-03-27 (×2): 1 [drp] via OPHTHALMIC
  Filled 2017-03-26: qty 2.5

## 2017-03-26 MED ORDER — ENALAPRIL MALEATE 2.5 MG PO TABS
2.5000 mg | ORAL_TABLET | Freq: Two times a day (BID) | ORAL | Status: DC
  Administered 2017-03-26 – 2017-03-28 (×4): 2.5 mg via ORAL
  Filled 2017-03-26 (×4): qty 1

## 2017-03-26 MED ORDER — CLOPIDOGREL BISULFATE 75 MG PO TABS
75.0000 mg | ORAL_TABLET | Freq: Every day | ORAL | Status: DC
  Administered 2017-03-27 – 2017-03-28 (×2): 75 mg via ORAL
  Filled 2017-03-26 (×2): qty 1

## 2017-03-26 MED ORDER — NITROGLYCERIN 0.4 MG SL SUBL: 0.4000 mg | SUBLINGUAL_TABLET | SUBLINGUAL | Status: DC | PRN

## 2017-03-26 MED ORDER — IPRATROPIUM-ALBUTEROL 0.5-2.5 (3) MG/3ML IN SOLN
RESPIRATORY_TRACT | Status: AC
  Filled 2017-03-26: qty 6

## 2017-03-26 MED ORDER — OXYCODONE-ACETAMINOPHEN 5-325 MG PO TABS: 1.0000 | ORAL_TABLET | Freq: Two times a day (BID) | ORAL | Status: DC | PRN

## 2017-03-26 MED ORDER — ACETAMINOPHEN 325 MG PO TABS
650.0000 mg | ORAL_TABLET | Freq: Four times a day (QID) | ORAL | Status: DC | PRN
  Administered 2017-03-27 (×2): 650 mg via ORAL
  Filled 2017-03-26 (×2): qty 2

## 2017-03-26 MED ORDER — METHYLPREDNISOLONE SODIUM SUCC 125 MG IJ SOLR
INTRAMUSCULAR | Status: AC
  Filled 2017-03-26: qty 2

## 2017-03-26 MED ORDER — IPRATROPIUM-ALBUTEROL 0.5-2.5 (3) MG/3ML IN SOLN
3.0000 mL | Freq: Four times a day (QID) | RESPIRATORY_TRACT | Status: DC
  Administered 2017-03-26: 3 mL via RESPIRATORY_TRACT

## 2017-03-26 MED ORDER — TIOTROPIUM BROMIDE MONOHYDRATE 18 MCG IN CAPS
18.0000 ug | ORAL_CAPSULE | Freq: Every day | RESPIRATORY_TRACT | Status: DC
  Administered 2017-03-27 – 2017-03-28 (×2): 18 ug via RESPIRATORY_TRACT
  Filled 2017-03-26: qty 5

## 2017-03-26 MED ORDER — DICLOFENAC SODIUM 1 % TD GEL
2.0000 g | Freq: Four times a day (QID) | TRANSDERMAL | Status: DC
  Administered 2017-03-27 – 2017-03-28 (×5): 2 g via TOPICAL
  Filled 2017-03-26: qty 100

## 2017-03-26 MED ORDER — PRAVASTATIN SODIUM 20 MG PO TABS
20.0000 mg | ORAL_TABLET | Freq: Every day | ORAL | Status: DC
  Administered 2017-03-26 – 2017-03-27 (×2): 20 mg via ORAL
  Filled 2017-03-26 (×2): qty 1

## 2017-03-26 MED ORDER — IPRATROPIUM-ALBUTEROL 0.5-2.5 (3) MG/3ML IN SOLN
3.0000 mL | Freq: Once | RESPIRATORY_TRACT | Status: AC
  Administered 2017-03-26: 3 mL via RESPIRATORY_TRACT
  Filled 2017-03-26: qty 3

## 2017-03-26 NOTE — ED Triage Notes (Signed)
Per EMS report, patient c/o increased dyspnea today. Patient comes from home, but goes to [facility]. Patient was given 2 duonebs treatments in the ambulance.

## 2017-03-26 NOTE — ED Notes (Signed)
Patient given Ed sandwich tray and ginger ale.

## 2017-03-26 NOTE — H&P (Signed)
[facility] [place] at [facility]   PATIENT NAME: Cynthia [name]    MR#:  [id number]  DATE OF BIRTH:  15-Nov-1940  DATE OF ADMISSION:  03/26/2017  PRIMARY CARE PHYSICIAN: [facility]  REQUESTING/REFERRING PHYSICIAN: Dr Willy Eddy  CHIEF COMPLAINT:   Chief Complaint  Patient presents with  . Shortness of Breath    HISTORY OF PRESENT ILLNESS:  Cynthia [name]  is a 77 y.o. female presents with shortness of breath and chest pain.  Patient is audible wheezing despite being given Solu-Medrol and nebulizer treatments in the ED.  Patient complains of chest pain 9 out of 10 in intensity hurting bad.  She is not the greatest historian.  She has an audible wheeze.  She states she got a cold and started wheezing yesterday.  She is coughing up yellow phlegm.  She had a fall the other day some soreness in her right arm and shoulder.  PAST MEDICAL HISTORY:   Past Medical History:  Diagnosis Date  . Blood clot in vein   . Coronary artery disease   . GERD (gastroesophageal reflux disease)   . Heart attack (HCC)   . Hx of CABG 2013  . Hyperlipidemia   . Hypertension     PAST SURGICAL HISTORY:   Past Surgical History:  Procedure Laterality Date  . CARDIAC SURGERY    . HIP SURGERY Left 12/2014  . SHOULDER SURGERY Bilateral     SOCIAL HISTORY:   Social History   Tobacco Use  . Smoking status: Current Every Day Smoker    Packs/day: 0.10    Years: 35.00    Pack years: 3.50    Types: Cigarettes  . Smokeless tobacco: Never Used  Substance Use Topics  . Alcohol use: No    FAMILY HISTORY:   Family History  Problem Relation Age of Onset  . Hypertension Other   . Alcohol abuse Other     DRUG ALLERGIES:   Allergies  Allergen Reactions  . Penicillins Swelling and Other (See Comments)    Has patient had a PCN reaction causing immediate rash, facial/tongue/throat swelling, SOB or lightheadedness with hypotension: No Has patient had a PCN reaction causing  severe rash involving mucus membranes or skin necrosis: No Has patient had a PCN reaction that required hospitalization: No Has patient had a PCN reaction occurring within the last 10 years: No If all of the above answers are "NO", then may proceed with Cephalosporin use.      REVIEW OF SYSTEMS:  CONSTITUTIONAL: No fever, chills or sweats.  Positive for fatigue.  Positive for weight gain. EYES: No blurred or double vision.  EARS, NOSE, AND THROAT: No tinnitus or ear pain. No sore throat.  Positive for runny nose. RESPIRATORY: Positive for cough, shortness of breath, and wheezing.  No hemoptysis.  CARDIOVASCULAR: Positive for no chest pain.  orthopnea, edema.  GASTROINTESTINAL: Positive for nausea, and diarrhea no . abdominal pain. No blood in bowel movements GENITOURINARY: No dysuria, hematuria.  ENDOCRINE: No polyuria, nocturia,  HEMATOLOGY: No anemia, easy bruising or bleeding SKIN: No rash or lesion. MUSCULOSKELETAL: Some shoulder pain NEUROLOGIC: No tingling, numbness, weakness.  PSYCHIATRY: No anxiety or depression.   MEDICATIONS AT HOME:   Prior to Admission medications   Medication Sig Start Date End Date Taking? Authorizing Provider  albuterol (PROVENTIL HFA;VENTOLIN HFA) 108 (90 Base) MCG/ACT inhaler Inhale 1-2 puffs into the lungs every 4 (four) hours as needed for wheezing or shortness of breath.   Yes [provider]  albuterol (PROVENTIL) (2.5 MG/3ML) 0.083% nebulizer solution Take 2.5 mg by nebulization every 4 (four) hours as needed for wheezing or shortness of breath.   Yes [provider]  aspirin EC 81 MG tablet Take 81 mg by mouth daily.   Yes [provider]  clopidogrel (PLAVIX) 75 MG tablet Take 75 mg by mouth daily.   Yes [provider]  diclofenac sodium (VOLTAREN) 1 % GEL Apply 2 g topically 4 (four) times daily. 04/29/16  Yes Phineas Semen, MD  DULoxetine (CYMBALTA) 60 MG capsule Take 60 mg by mouth daily.   Yes [provider]  enalapril (VASOTEC) 2.5 MG tablet Take 2.5 mg by mouth 2 (two) times daily.   Yes [provider]  furosemide (LASIX) 40 MG tablet Take 40 mg by mouth daily.    Yes [provider]  isosorbide mononitrate (IMDUR) 30 MG 24 hr tablet Take 30 mg by mouth daily.   Yes [provider]  latanoprost (XALATAN) 0.005 % ophthalmic solution Place 1 drop into both eyes at bedtime.   Yes [provider]  metoprolol succinate (TOPROL-XL) 25 MG 24 hr tablet Take 25 mg by mouth daily.   Yes [provider]  nitroGLYCERIN (NITROSTAT) 0.3 MG SL tablet Place 0.3 mg under the tongue every 5 (five) minutes as needed for chest pain.   Yes [provider]  oxyCODONE-acetaminophen (PERCOCET/ROXICET) 5-325 MG tablet Take 1 tablet by mouth 2 (two) times daily as needed for severe pain.   Yes [provider]  pravastatin (PRAVACHOL) 20 MG tablet Take 20 mg by mouth at bedtime.   Yes [provider]  tiotropium (SPIRIVA) 18 MCG inhalation capsule Place 18 mcg into inhaler and inhale daily.   Yes [provider]  Vitamin D, Ergocalciferol, (DRISDOL) 50000 units CAPS capsule Take 50,000 Units by mouth every 30 (thirty) days. Take on the first day of each month.   Yes [provider]  hydrALAZINE (APRESOLINE) 25 MG tablet Take 25 mg by mouth daily.     [provider]  predniSONE (DELTASONE) 20 MG tablet Take 20 mg by mouth 3 (three) times daily as needed (breathing problems).    [provider]      VITAL SIGNS:  Blood pressure (!) 161/90, pulse (!) 56, temperature 98.1 F (36.7 C), temperature source Oral, resp. rate (!) 21, height 6' (1.829 m), weight 88.9 kg (196 lb), SpO2 99 %.  PHYSICAL EXAMINATION:  GENERAL:  77 y.o.-year-old patient lying in the bed with audible wheeze heard without a stethoscope.  EYES: Pupils equal, round, reactive to light and accommodation. No scleral icterus. Extraocular  muscles intact.  HEENT: Head atraumatic, normocephalic. Oropharynx and nasopharynx clear.  NECK:  Supple, no jugular venous distention. No thyroid enlargement, no tenderness.  LUNGS:  decreased breath sounds bilaterally, positive for wheezing  throughout entire lung field.  No rales,rhonchi or crepitation. No use of accessory muscles of respiration.  CARDIOVASCULAR: S1, S2 normal. No murmurs, rubs, or gallops.  ABDOMEN: Soft, nontender, nondistended. Bowel sounds present. No organomegaly or mass.  EXTREMITIES: Trace pedal edema, no cyanosis, or clubbing.  NEUROLOGIC: Cranial nerves II through XII are intact. Muscle strength 5/5 in all extremities. Sensation intact. Gait not checked.  PSYCHIATRIC: The patient is alert and oriented x 3.  SKIN: No rash, lesion, or ulcer.   LABORATORY PANEL:   CBC Recent Labs  Lab 03/26/17 1729  WBC 2.5*  HGB 11.5*  HCT 35.5  PLT 122*   ------------------------------------------------------------------------------------------------------------------  Chemistries  Recent Labs  Lab 03/26/17 1729  NA 141  K 4.2  CL 105  CO2 29  GLUCOSE 105*  BUN 25*  CREATININE 1.29*  CALCIUM 8.6*  AST 23  ALT 21  ALKPHOS 135*  BILITOT 0.9   ------------------------------------------------------------------------------------------------------------------    RADIOLOGY:  Dg Chest 2 View  Result Date: 03/26/2017 CLINICAL DATA:  Patient with shortness of breath. EXAM: CHEST  2 VIEW COMPARISON:  Chest radiograph 06/10/2015 FINDINGS: Monitoring leads overlie the patient. Stable cardiomegaly status post median sternotomy. Aortic atherosclerosis. No consolidative pulmonary opacities. No pleural effusion or pneumothorax. Regional skeleton is unremarkable. IMPRESSION: No acute cardiopulmonary process.  Cardiomegaly. Electronically Signed   By: Annia Beltrew  Davis M.D.   On: 03/26/2017 18:26    EKG:   Sinus arrhythmia with right bundle branch block and left anterior  fascicular block  IMPRESSION AND PLAN:   1.  COPD exacerbation.  Solu-Medrol, budesonide and DuoNeb nebulizer solution.  Patient will need to move better air and have less wheezing prior to disposition.  Listen to the lungs on a daily basis. 2.  Essential hypertension.  Blood pressure is elevated likely because of her trouble breathing.  Continue her usual medications and monitor closely. 3.  History of CAD.  Continue aspirin Plavix and beta-blocker. 4.  Hyperlipidemia unspecified on pravastatin. 5.  Tobacco abuse smoking cessation counseling done 4 minutes by me.  Patient states she only smokes 1 cigarette after she gets back [facility].  She is not allowed to smoke at the [facility]. 6.  Pancytopenia send off hepatitis C   I have reviewed the patient's EKG, chest x-ray and laboratory data  Management plans discussed with the patient, and she is in agreement.  ER physician spoke with the [facility] physician about admission.  CODE STATUS: Full code  TOTAL TIME TAKING CARE OF THIS PATIENT: 50 minutes.    Alford Highlandichard Aztlan Coll M.D on 03/26/2017 at 8:54 PM  Between 7am to 6pm - Pager - [phone]  After 6pm call admission pager [phone]  [facility]  [phone]  CC: Primary care physician; [facility]

## 2017-03-26 NOTE — ED Provider Notes (Signed)
[facility] Emergency Department Provider Note    First MD Initiated Contact with Patient 03/26/17 1721     (approximate)  I have reviewed the triage vital signs and the nursing notes.   HISTORY  Chief Complaint Shortness of Breath    HPI Cynthia [name] is a 77 y.o. female with a history of bronchitis as well as CAD and CABG presents with a chief complaint of shortness of breath and chest pains.  Says he is feeling short of breath with productive cough starting yesterday.  States his breathing got significantly worse today.  Denies any lower extremity swelling.  No orthopnea.  No chest pain at this time.  No fevers.  Patient was brought in by EMS and given nebulizer treatment with significant improvement in her breathing but is still having wheeze and some chest tightness.   Past Medical History:  Diagnosis Date  . Blood clot in vein   . Coronary artery disease   . GERD (gastroesophageal reflux disease)   . Heart attack (HCC)   . Hx of CABG 2013  . Hyperlipidemia   . Hypertension    Family History  Problem Relation Age of Onset  . Hypertension Other   . Alcohol abuse Other    Past Surgical History:  Procedure Laterality Date  . CARDIAC SURGERY    . HIP SURGERY Left 12/2014   Patient Active Problem List   Diagnosis Date Noted  . Pelvic fracture (HCC) 04/30/2016  . HTN (hypertension) 04/30/2016  . HLD (hyperlipidemia) 04/30/2016  . GERD (gastroesophageal reflux disease) 04/30/2016  . CAD (coronary artery disease) 04/30/2016  . Chest pain 09/08/2014  . Dehydration 09/08/2014      Prior to Admission medications   Medication Sig Start Date End Date Taking? Authorizing Provider  albuterol (PROVENTIL HFA;VENTOLIN HFA) 108 (90 Base) MCG/ACT inhaler Inhale 1-2 puffs into the lungs every 4 (four) hours as needed for wheezing or shortness of breath.   Yes [provider]  albuterol (PROVENTIL) (2.5 MG/3ML) 0.083% nebulizer solution  Take 2.5 mg by nebulization every 4 (four) hours as needed for wheezing or shortness of breath.   Yes [provider]  aspirin EC 81 MG tablet Take 81 mg by mouth daily.   Yes [provider]  clopidogrel (PLAVIX) 75 MG tablet Take 75 mg by mouth daily.   Yes [provider]  diclofenac sodium (VOLTAREN) 1 % GEL Apply 2 g topically 4 (four) times daily. 04/29/16  Yes Phineas SemenGoodman, Graydon, MD  DULoxetine (CYMBALTA) 60 MG capsule Take 60 mg by mouth daily.   Yes [provider]  enalapril (VASOTEC) 2.5 MG tablet Take 2.5 mg by mouth 2 (two) times daily.   Yes [provider]  furosemide (LASIX) 40 MG tablet Take 40 mg by mouth daily.    Yes [provider]  isosorbide mononitrate (IMDUR) 30 MG 24 hr tablet Take 30 mg by mouth daily.   Yes [provider]  latanoprost (XALATAN) 0.005 % ophthalmic solution Place 1 drop into both eyes at bedtime.   Yes [provider]  metoprolol succinate (TOPROL-XL) 25 MG 24 hr tablet Take 25 mg by mouth daily.   Yes [provider]  nitroGLYCERIN (NITROSTAT) 0.3 MG SL tablet Place 0.3 mg under the tongue every 5 (five) minutes as needed for chest pain.   Yes [provider]  oxyCODONE-acetaminophen (PERCOCET/ROXICET) 5-325 MG tablet Take 1 tablet by mouth 2 (two) times daily as needed for severe pain.  Yes [provider]  pravastatin (PRAVACHOL) 20 MG tablet Take 20 mg by mouth at bedtime.   Yes [provider]  tiotropium (SPIRIVA) 18 MCG inhalation capsule Place 18 mcg into inhaler and inhale daily.   Yes [provider]  Vitamin D, Ergocalciferol, (DRISDOL) 50000 units CAPS capsule Take 50,000 Units by mouth every 30 (thirty) days. Take on the first day of each month.   Yes [provider]  hydrALAZINE (APRESOLINE) 25 MG tablet Take 25 mg by mouth daily.     [provider]  predniSONE (DELTASONE) 20 MG tablet Take 20 mg by mouth 3  (three) times daily as needed (breathing problems).    [provider]    Allergies Penicillins    Social History Social History   Tobacco Use  . Smoking status: Former Smoker    Packs/day: 0.50    Years: 35.00    Pack years: 17.50    Types: Cigarettes  . Smokeless tobacco: Never Used  Substance Use Topics  . Alcohol use: No  . Drug use: No    Review of Systems Patient denies headaches, rhinorrhea, blurry vision, numbness, shortness of breath, chest pain, edema, cough, abdominal pain, nausea, vomiting, diarrhea, dysuria, fevers, rashes or hallucinations unless otherwise stated above in HPI. ____________________________________________   PHYSICAL EXAM:  VITAL SIGNS: Vitals:   03/26/17 1900 03/26/17 1930  BP: (!) 145/95 (!) 152/89  Pulse: (!) 56 (!) 57  Resp: (!) 21 (!) 22  Temp:    SpO2: 92% 92%    Constitutional: Alert and oriented.  in no acute distress. Eyes: Conjunctivae are normal.  Head: Atraumatic. Nose: No congestion/rhinnorhea. Mouth/Throat: Mucous membranes are moist.   Neck: No stridor. Painless ROM.  Cardiovascular: Normal rate, regular rhythm. Grossly normal heart sounds.  Good peripheral circulation. Respiratory: mild tachypnea, diffuse wheeze with prolonged expiratory phase  Gastrointestinal: Soft and nontender. No distention. No abdominal bruits. No CVA tenderness. Genitourinary:  Musculoskeletal: No lower extremity tenderness nor edema.  No joint effusions. Neurologic:  Normal speech and language. No gross focal neurologic deficits are appreciated. No facial droop Skin:  Skin is warm, dry and intact. No rash noted. Psychiatric: Mood and affect are normal. Speech and behavior are normal.  ____________________________________________   LABS (all labs ordered are listed, but only abnormal results are displayed)  Results for orders placed or performed during the hospital encounter of 03/26/17 (from the past 24 hour(s))  CBC with  Differential/Platelet     Status: Abnormal   Collection Time: 03/26/17  5:29 PM  Result Value Ref Range   WBC 2.5 (L) 3.6 - 11.0 K/uL   RBC 3.37 (L) 3.80 - 5.20 MIL/uL   Hemoglobin 11.5 (L) 12.0 - 16.0 g/dL   HCT 96.0 45.4 - 09.8 %   MCV 105.1 (H) 80.0 - 100.0 fL   MCH 34.2 (H) 26.0 - 34.0 pg   MCHC 32.6 32.0 - 36.0 g/dL   RDW 11.9 14.7 - 82.9 %   Platelets 122 (L) 150 - 440 K/uL   Neutrophils Relative % 41 %   Neutro Abs 1.0 (L) 1.4 - 6.5 K/uL   Lymphocytes Relative 35 %   Lymphs Abs 0.9 (L) 1.0 - 3.6 K/uL   Monocytes Relative 20 %   Monocytes Absolute 0.5 0.2 - 0.9 K/uL   Eosinophils Relative 3 %   Eosinophils Absolute 0.1 0 - 0.7 K/uL   Basophils Relative 1 %   Basophils Absolute 0.0 0 - 0.1 K/uL  Comprehensive metabolic panel  Status: Abnormal   Collection Time: 03/26/17  5:29 PM  Result Value Ref Range   Sodium 141 135 - 145 mmol/L   Potassium 4.2 3.5 - 5.1 mmol/L   Chloride 105 101 - 111 mmol/L   CO2 29 22 - 32 mmol/L   Glucose, Bld 105 (H) 65 - 99 mg/dL   BUN 25 (H) 6 - 20 mg/dL   Creatinine, Ser 1.61 (H) 0.44 - 1.00 mg/dL   Calcium 8.6 (L) 8.9 - 10.3 mg/dL   Total Protein 6.9 6.5 - 8.1 g/dL   Albumin 4.0 3.5 - 5.0 g/dL   AST 23 15 - 41 U/L   ALT 21 14 - 54 U/L   Alkaline Phosphatase 135 (H) 38 - 126 U/L   Total Bilirubin 0.9 0.3 - 1.2 mg/dL   GFR calc non Af Amer 39 (L) >60 mL/min   GFR calc Af Amer 45 (L) >60 mL/min   Anion gap 7 5 - 15   ____________________________________________  EKG My review and personal interpretation at Time: 17:04   Indication: sob  Rate: 75  Rhythm: sinus Axis: normal Other: normal intervals, no stemi ____________________________________________  RADIOLOGY  I personally reviewed all radiographic images ordered to evaluate for the above acute complaints and reviewed radiology reports and findings.  These findings were personally discussed with the patient.  Please see medical record for radiology  report.  ____________________________________________   PROCEDURES  Procedure(s) performed:  Procedures    Critical Care performed: no ____________________________________________   INITIAL IMPRESSION / ASSESSMENT AND PLAN / ED COURSE  Pertinent labs & imaging results that were available during my care of the patient were reviewed by me and considered in my medical decision making (see chart for details).  DDX: Asthma, copd, CHF, pna, ptx, malignancy, Pe, anemia   Milly Iram Astorino is a 77 y.o. who presents to the ED with shortness of breath as described above.  Based on the wheeze seems primarily COPD or bronchitic in nature.  No fevers.  Chest x-ray shows no evidence of congestive heart failure.  Patient did have some improvement with nebulizers therefore will give steroids.  Will check for flu.  Will assess for improvement.  Clinical Course as of Mar 27 2027  Wynelle Link Mar 26, 2017  2026 This reassessed patient.  Still with tachypnea prolonged expiratory ways and speaking short sentences.  No hypoxia do not believe patient requires BiPAP at this time but given her mild respiratory distress do believe patient would benefit from admission for further medical management.  [PR]    Clinical Course User Index [PR] Willy Eddy, MD     ____________________________________________   FINAL CLINICAL IMPRESSION(S) / ED DIAGNOSES  Final diagnoses:  COPD exacerbation (HCC)  Acute respiratory distress      NEW MEDICATIONS STARTED DURING THIS VISIT:  New Prescriptions   No medications on file     Note:  This document was prepared using Dragon voice recognition software and may include unintentional dictation errors.    Willy Eddy, MD 03/26/17 2029

## 2017-03-27 LAB — RESPIRATORY PANEL BY PCR
Adenovirus: NOT DETECTED
BORDETELLA PERTUSSIS-RVPCR: NOT DETECTED
CORONAVIRUS 229E-RVPPCR: NOT DETECTED
CORONAVIRUS OC43-RVPPCR: NOT DETECTED
Chlamydophila pneumoniae: NOT DETECTED
Coronavirus HKU1: NOT DETECTED
Coronavirus NL63: NOT DETECTED
INFLUENZA A-RVPPCR: NOT DETECTED
INFLUENZA B-RVPPCR: NOT DETECTED
MYCOPLASMA PNEUMONIAE-RVPPCR: NOT DETECTED
Metapneumovirus: DETECTED — AB
PARAINFLUENZA VIRUS 1-RVPPCR: NOT DETECTED
PARAINFLUENZA VIRUS 4-RVPPCR: NOT DETECTED
Parainfluenza Virus 2: NOT DETECTED
Parainfluenza Virus 3: NOT DETECTED
RESPIRATORY SYNCYTIAL VIRUS-RVPPCR: NOT DETECTED
Rhinovirus / Enterovirus: NOT DETECTED

## 2017-03-27 LAB — BASIC METABOLIC PANEL
ANION GAP: 10 (ref 5–15)
BUN: 23 mg/dL — ABNORMAL HIGH (ref 6–20)
CALCIUM: 9 mg/dL (ref 8.9–10.3)
CO2: 24 mmol/L (ref 22–32)
Chloride: 107 mmol/L (ref 101–111)
Creatinine, Ser: 1.15 mg/dL — ABNORMAL HIGH (ref 0.44–1.00)
GFR, EST AFRICAN AMERICAN: 52 mL/min — AB (ref >60)
GFR, EST NON AFRICAN AMERICAN: 45 mL/min — AB (ref >60)
GLUCOSE: 132 mg/dL — AB (ref 65–99)
POTASSIUM: 4.5 mmol/L (ref 3.5–5.1)
SODIUM: 141 mmol/L (ref 135–145)

## 2017-03-27 LAB — CBC
HCT: 36.9 % (ref 35.0–47.0)
Hemoglobin: 12.1 g/dL (ref 12.0–16.0)
MCH: 33.8 pg (ref 26.0–34.0)
MCHC: 32.7 g/dL (ref 32.0–36.0)
MCV: 103.3 fL — AB (ref 80.0–100.0)
Platelets: 111 10*3/uL — ABNORMAL LOW (ref 150–440)
RBC: 3.57 MIL/uL — ABNORMAL LOW (ref 3.80–5.20)
RDW: 13.8 % (ref 11.5–14.5)
WBC: 1.7 10*3/uL — ABNORMAL LOW (ref 3.6–11.0)

## 2017-03-27 LAB — TROPONIN I: Troponin I: <0.03 ng/mL (ref <0.03)

## 2017-03-27 MED ORDER — SODIUM CHLORIDE 0.9% FLUSH
3.0000 mL | Freq: Two times a day (BID) | INTRAVENOUS | Status: DC
  Administered 2017-03-27 – 2017-03-28 (×2): 3 mL via INTRAVENOUS

## 2017-03-27 MED ORDER — OXYCODONE-ACETAMINOPHEN 5-325 MG PO TABS
1.0000 | ORAL_TABLET | Freq: Two times a day (BID) | ORAL | Status: DC | PRN
  Filled 2017-03-27: qty 1

## 2017-03-27 MED ORDER — ENSURE ENLIVE PO LIQD
237.0000 mL | Freq: Two times a day (BID) | ORAL | Status: DC
  Administered 2017-03-27 – 2017-03-28 (×2): 237 mL via ORAL

## 2017-03-27 MED ORDER — ADULT MULTIVITAMIN W/MINERALS CH
1.0000 | ORAL_TABLET | Freq: Every day | ORAL | Status: DC
  Administered 2017-03-27 – 2017-03-28 (×2): 1 via ORAL
  Filled 2017-03-27 (×2): qty 1

## 2017-03-27 MED ORDER — HALOPERIDOL LACTATE 5 MG/ML IJ SOLN
2.0000 mg | Freq: Once | INTRAMUSCULAR | Status: AC
  Administered 2017-03-27: 2 mg via INTRAVENOUS
  Filled 2017-03-27: qty 1

## 2017-03-27 MED ORDER — LORATADINE 10 MG PO TABS
10.0000 mg | ORAL_TABLET | Freq: Every day | ORAL | Status: DC
  Administered 2017-03-27 – 2017-03-28 (×2): 10 mg via ORAL
  Filled 2017-03-27 (×2): qty 1

## 2017-03-27 NOTE — Progress Notes (Signed)
Initial Nutrition Assessment  DOCUMENTATION CODES:   Not applicable  INTERVENTION:   Ensure Enlive po BID, each supplement provides 350 kcal and 20 grams of protein  MVI daily   Liberalize diet   NUTRITION DIAGNOSIS:   Inadequate oral intake related to acute illness as evidenced by meal completion < 50%.  GOAL:   Patient will meet greater than or equal to 90% of their needs  MONITOR:   PO intake, Supplement acceptance, Weight trends, Labs, I & O's  REASON FOR ASSESSMENT:   Malnutrition Screening Tool    ASSESSMENT:   77 y.o. female with a history of bronchitis as well as CAD and CABG presents with a chief complaint of shortness of breath and chest pains. Pt admitted for COPD exacerbation    Met with pt in room today. Pt seems slightly confused today but reports good appetite and oral intake pta. Pt's lunch tray was sitting on pt's side table today; pt had eaten about 50% of her lunch. Pt reports that she likes strawberry and chocolate Ensure. Pt reports stable weight but per chart appears to have lost 39lbs(18%) over the past 6 months; this is severe. RD is unsure if pt has had any true weight loss as pt has only had one weight this admit which appears reported. RD requested new weight on this pt from nursing. RD will order supplements and liberalize diet.   Medications reviewed and include: aspirin, plavix, lovenox, solu-medrol  Labs reviewed: BUN 23(H), creat 1.15(H) BNP- 1502(H)- 3/3 Wbc- 1.7(L)  Nutrition-Focused physical exam completed. Findings are no fat depletion, no muscle depletion, and no edema.   Diet Order:  Diet Heart Room service appropriate? Yes; Fluid consistency: Thin  EDUCATION NEEDS:   Education needs have been addressed  Skin: Reviewed RN Assessment  Last BM:  3/4- type 6  Height:   Ht Readings from Last 1 Encounters:  03/26/17 6' (1.829 m)    Weight:   Wt Readings from Last 1 Encounters:  03/26/17 172 lb (78 kg)    Ideal Body  Weight:  72.7 kg  BMI:  Body mass index is 23.33 kg/m.  Estimated Nutritional Needs:   Kcal:  1800-2100kcal/day   Protein:  94-109g/day   Fluid:  >1.9L/day   Koleen Distance MS, RD, LDN Pager #[phone] After Hours Pager: [phone]

## 2017-03-27 NOTE — Progress Notes (Signed)
Notified Dr Sheryle Hailiamond of pt confused at the present time. Pt was able to answer questions appropriately when admitted. Received order for medication

## 2017-03-27 NOTE — Progress Notes (Signed)
[facility] is with [facility] and lives at home. [facility] rep Cat came to see patient today. RN case manager aware of above.   Baker Hughes IncorporatedBailey Clayburn Weekly, LCSW [phone]

## 2017-03-27 NOTE — Progress Notes (Signed)
Patient admitted for CHF/COPD exacerbation, HTN, CXR: cardiomegaly. Patient's HR in-house: 56 - 67  Patient takes metoprolol succinate 25 mg daily PTA. Spoke to MD to hold dose for now. MD notified and agrees w/ plan.  Thomasene Rippleavid Legrande Hao, PharmD, BCPS Clinical Pharmacist 03/27/2017

## 2017-03-27 NOTE — Progress Notes (Signed)
[facility] [place] [facility]   PATIENT NAME: Cynthia [name]    MR#:  [id number]  DATE OF BIRTH:  10/15/1940  SUBJECTIVE:  CHIEF COMPLAINT:   Chief Complaint  Patient presents with  . Shortness of Breath  Came with worsening shortness of breath cough.  For slightly better today.  REVIEW OF SYSTEMS:  CONSTITUTIONAL: No fever, fatigue or weakness.  EYES: No blurred or double vision.  EARS, NOSE, AND THROAT: No tinnitus or ear pain.  RESPIRATORY: Have cough, shortness of breath, wheezing , no hemoptysis.  CARDIOVASCULAR: No chest pain, orthopnea, edema.  GASTROINTESTINAL: No nausea, vomiting, diarrhea or abdominal pain.  GENITOURINARY: No dysuria, hematuria.  ENDOCRINE: No polyuria, nocturia,  HEMATOLOGY: No anemia, easy bruising or bleeding SKIN: No rash or lesion. MUSCULOSKELETAL: No joint pain or arthritis.   NEUROLOGIC: No tingling, numbness, weakness.  PSYCHIATRY: No anxiety or depression.   ROS  DRUG ALLERGIES:   Allergies  Allergen Reactions  . Penicillins Swelling and Other (See Comments)    Has patient had a PCN reaction causing immediate rash, facial/tongue/throat swelling, SOB or lightheadedness with hypotension: No Has patient had a PCN reaction causing severe rash involving mucus membranes or skin necrosis: No Has patient had a PCN reaction that required hospitalization: No Has patient had a PCN reaction occurring within the last 10 years: No If all of the above answers are "NO", then may proceed with Cephalosporin use.      VITALS:  Blood pressure (!) 154/84, pulse 73, temperature 97.9 F (36.6 C), temperature source Oral, resp. rate 18, height 6' (1.829 m), weight 78 kg (172 lb), SpO2 95 %.  PHYSICAL EXAMINATION:  GENERAL:  77 y.o.-year-old patient lying in the bed with no acute distress.  EYES: Pupils equal, round, reactive to light and accommodation. No scleral icterus. Extraocular muscles intact.  HEENT: Head atraumatic,  normocephalic. Oropharynx and nasopharynx clear.  NECK:  Supple, no jugular venous distention. No thyroid enlargement, no tenderness.  LUNGS: Normal breath sounds bilaterally, bilateral wheezing, no crepitation. No use of accessory muscles of respiration.  CARDIOVASCULAR: S1, S2 normal. No murmurs, rubs, or gallops.  ABDOMEN: Soft, nontender, nondistended. Bowel sounds present. No organomegaly or mass.  EXTREMITIES: No pedal edema, cyanosis, or clubbing.  NEUROLOGIC: Cranial nerves II through XII are intact. Muscle strength 4/5 in all extremities. Sensation intact. Gait not checked.  PSYCHIATRIC: The patient is alert and oriented x 2.  SKIN: No obvious rash, lesion, or ulcer.   Physical Exam LABORATORY PANEL:   CBC Recent Labs  Lab 03/27/17 0333  WBC 1.7*  HGB 12.1  HCT 36.9  PLT 111*   ------------------------------------------------------------------------------------------------------------------  Chemistries  Recent Labs  Lab 03/26/17 1729 03/27/17 0333  NA 141 141  K 4.2 4.5  CL 105 107  CO2 29 24  GLUCOSE 105* 132*  BUN 25* 23*  CREATININE 1.29* 1.15*  CALCIUM 8.6* 9.0  AST 23  --   ALT 21  --   ALKPHOS 135*  --   BILITOT 0.9  --    ------------------------------------------------------------------------------------------------------------------  Cardiac Enzymes Recent Labs  Lab 03/26/17 2228 03/27/17 0333  TROPONINI <0.03 <0.03   ------------------------------------------------------------------------------------------------------------------  RADIOLOGY:  Dg Chest 2 View  Result Date: 03/26/2017 CLINICAL DATA:  Patient with shortness of breath. EXAM: CHEST  2 VIEW COMPARISON:  Chest radiograph 06/10/2015 FINDINGS: Monitoring leads overlie the patient. Stable cardiomegaly status post median sternotomy. Aortic atherosclerosis. No consolidative pulmonary opacities. No pleural effusion or pneumothorax. Regional skeleton is unremarkable. IMPRESSION: No  acute  cardiopulmonary process.  Cardiomegaly. Electronically Signed   By: Annia Beltrew  Davis M.D.   On: 03/26/2017 18:26    ASSESSMENT AND PLAN:   Active Problems:   COPD exacerbation (HCC)  1.  COPD exacerbation.  Solu-Medrol, budesonide and DuoNeb nebulizer solution.     Influenza negative, found to have metapneumovirus on respiratory panel. 2.  Essential hypertension.  Blood pressure is elevated likely because of her trouble breathing.  Continue her usual medications and monitor closely. 3.  History of CAD.  Continue aspirin Plavix and beta-blocker. 4.  Hyperlipidemia unspecified on pravastatin. 5.  Tobacco abuse smoking cessation counseling done 4 minutes by me.   6.  Pancytopenia send off hepatitis C- pending result.   All the records are reviewed and case discussed with Care Management/Social Workerr. Management plans discussed with the patient, family and they are in agreement.  CODE STATUS:full.  TOTAL TIME TAKING CARE OF THIS PATIENT: 35 minutes.  Her husband is in the room during my visit.   POSSIBLE D/C IN 1-2 DAYS, DEPENDING ON CLINICAL CONDITION.   Altamese DillingVaibhavkumar Keshonda Monsour M.D on 03/27/2017   Between 7am to 6pm - Pager - [phone]  After 6pm go to [email or web address] - password [email or web address]  [facility]  Office  [phone]  CC: Primary care physician; System, Pcp Not In  Note: This dictation was prepared with Dragon dictation along with smaller phrase technology. Any transcriptional errors that result from this process are unintentional.

## 2017-03-27 NOTE — Progress Notes (Signed)
Physical Therapy Evaluation Patient Details Name: Cynthia [name] MRN: [id number] DOB: [birth year] Today's Date: 03/27/2017   History of Present Illness  Cynthia [name]  is a 77 y.o. female presents with shortness of breath and chest pain.  Patient had audible wheezing despite being given Solu-Medrol and nebulizer treatments in the ED.  She states she got a cold and started wheezing yesterday.  She is coughing up yellow phlegm.  She had a fall the other day some soreness in her right arm and shoulder. She is currently admitted for acute COPD exacerbation. PMH includes CAD, MI, CABG, HTN, and hyperlipidemia.   Clinical Impression  Pt admitted with above diagnosis. Pt currently with functional limitations due to the deficits listed below (see PT Problem List). Pt is modified independent for bed mobility but requires increase in time. She is able to ambulate multiple laps in room as well as ambulate from bed to commode with therapist. Cues for safe use of rolling walker and occasional assist to stear walker as pt bumps into wall and trash can with walker. SaO2>90% on room air throughout ambulation and pt denies DOE however audible labored breaths soundes. Pt able to transfer from low bed with CGA only. Safe hand placement. Increase in time required due to LE weakness. Cues to descend onto commode with use of hand rail. Pt would benefit from [facility] PT for conditioning and balance. She has a rollator but would benefit from a rolling walker for added stability to decrease fall risk. Pt complaining of R shoulder pain. She has a history of chronic R shoulder pain but also reports acute worsening since fall onto R shoulder. Indicated in H&P but no imaging noted of R shoulder. RN notified. Pt will benefit from PT services to address deficits in strength, balance, and mobility in order to return to full function at home.       Follow Up Recommendations Home health PT    Equipment Recommendations  Rolling walker with 5"  wheels;Other (comment)(Pt has rollator but not front-wheeled walker)    Recommendations for Other Services       Precautions / Restrictions Precautions Precautions: Fall Restrictions Weight Bearing Restrictions: No      Mobility  Bed Mobility Overal bed mobility: Modified Independent             General bed mobility comments: HOB elevated and use of bed rails. Increase in time  Transfers Overall transfer level: Needs assistance Equipment used: Rolling walker (2 wheeled) Transfers: Sit to/from Stand Sit to Stand: Min guard         General transfer comment: Pt able to transfer from low bed with CGA only. Safe hand placement. Increase in time required due to LE weakness. Cues to descend onto commode with use of hand rail  Ambulation/Gait Ambulation/Gait assistance: Min guard Ambulation Distance (Feet): 50 Feet Assistive device: Rolling walker (2 wheeled)   Gait velocity: Decreased   General Gait Details: Pt able to ambulate multiple laps in room as well as ambulate from bed to commode with therapist. Cues for safe use of rolling walker and occasional assist to stear as pt bumps into wall and trash can with walker. SaO2>90% on room air throughout ambulation and pt denies DOE however audible labored breaths soundes  Stairs            Wheelchair Mobility    Modified Rankin (Stroke Patients Only)       Balance Overall balance assessment: Needs assistance Sitting-balance support: No upper extremity supported Sitting  balance-Leahy Scale: Good     Standing balance support: No upper extremity supported Standing balance-Leahy Scale: Fair Standing balance comment: Able to maintain balance with feet apart/together. Negative Romberg. Unable to attempt single leg balance                             Pertinent Vitals/Pain Pain Assessment: 0-10 Pain Score: 8  Pain Location: R shoulder pain, 10/10 headache pain currently Pain Intervention(s): Patient  requesting pain meds-RN notified    Home Living Family/patient expects to be discharged to:: Private residence Living Arrangements: Spouse/significant other Available Help at Discharge: Family;Personal care attendant;Other (Comment)(HH aid 5d/wk) Type of Home: Apartment Home Access: Stairs to enter Entrance Stairs-Rails: Right;Left Entrance Stairs-Number of Steps: 4 Home Layout: One level Home Equipment: Grab bars - tub/shower;Walker - 4 wheels;Tub bench;Bedside commode      Prior Function Level of Independence: Needs assistance   Gait / Transfers Assistance Needed: Ambulates with rollator outside of home. Furniture walker. "A couple falls in the last 12 months"  ADL's / Homemaking Assistance Needed: HH aid who comes 5d/wk, two times/day, 2h in AM and 4 hr in PM, 6 hours on Sunday. Requires assist with ADLs/IADLs        Hand Dominance   Dominant Hand: Right    Extremity/Trunk Assessment   Upper Extremity Assessment Upper Extremity Assessment: RUE deficits/detail RUE Deficits / Details: Pt reports pain with AROM of R shoulder and is only able to flex to approximately 90 degrees. Pain with palpation to upper humerus near shoulder. Pt reports history of chronic R shoulder pain as well as acute pain since recent fall. LUE strength is also decreased. Denies bilateral UE N/T    Lower Extremity Assessment Lower Extremity Assessment: Generalized weakness       Communication   Communication: No difficulties  Cognition Arousal/Alertness: Awake/alert Behavior During Therapy: WFL for tasks assessed/performed Overall Cognitive Status: Impaired/Different from baseline Area of Impairment: Orientation                 Orientation Level: Disoriented to;Time;Situation             General Comments: Pt currently disoriented to month/year. Per husband and pt most of the time she is oriented to time.      General Comments      Exercises     Assessment/Plan    PT  Assessment Patient needs continued PT services  PT Problem List Decreased strength;Decreased activity tolerance;Decreased balance;Decreased mobility;Cardiopulmonary status limiting activity       PT Treatment Interventions DME instruction;Gait training;Functional mobility training;Stair training;Therapeutic activities;Therapeutic exercise;Balance training;Neuromuscular re-education;Patient/family education    PT Goals (Current goals can be found in the Care Plan section)  Acute Rehab PT Goals Patient Stated Goal: Return to prior function at home PT Goal Formulation: With patient/family Time For Goal Achievement: 04/10/17 Potential to Achieve Goals: Good    Frequency Min 2X/week   Barriers to discharge        Co-evaluation               AM-PAC PT "6 Clicks" Daily Activity  Outcome Measure Difficulty turning over in bed (including adjusting bedclothes, sheets and blankets)?: A Little Difficulty moving from lying on back to sitting on the side of the bed? : A Little Difficulty sitting down on and standing up from a chair with arms (e.g., wheelchair, bedside commode, etc,.)?: A Little Help needed moving to and from a bed to chair (  including a wheelchair)?: A Little Help needed walking in hospital room?: A Little Help needed climbing 3-5 steps with a railing? : A Little 6 Click Score: 18    End of Session Equipment Utilized During Treatment: Gait belt Activity Tolerance: Patient tolerated treatment well Patient left: with call bell/phone within reach;Other (comment)(In bathroom, RN and CNA notified, pt to pull cord,) Nurse Communication: Patient requests pain meds PT Visit Diagnosis: Muscle weakness (generalized) (M62.81);History of falling (Z91.81);Difficulty in walking, not elsewhere classified (R26.2);Unsteadiness on feet (R26.81)    Time: 1610-9604 PT Time Calculation (min) (ACUTE ONLY): 23 min   Charges:   PT Evaluation $PT Eval Low Complexity: 1 Low PT  Treatments $Therapeutic Activity: 8-22 mins   PT G Codes:        Sharalyn Ink Codi Folkerts PT, DPT    Ellenore Roscoe 03/27/2017, 12:33 PM

## 2017-03-27 NOTE — Plan of Care (Signed)
  Education: Knowledge of General Education information will improve 03/27/2017 1259 - Progressing by Tomie ChinaJackson, Hiren Peplinski Cecelie, RN   Education: Knowledge of General Education information will improve 03/27/2017 1259 - Progressing by Tomie ChinaJackson, Deania Siguenza Cecelie, RN   Education: Knowledge of General Education information will improve 03/27/2017 1259 - Progressing by Tomie ChinaJackson, Treyveon Mochizuki Cecelie, RN   Education: Knowledge of General Education information will improve 03/27/2017 1259 - Progressing by Tomie ChinaJackson, Florentine Diekman Cecelie, RN   Education: Knowledge of General Education information will improve 03/27/2017 1259 - Progressing by Tomie ChinaJackson, Ivania Teagarden Cecelie, RN   Education: Knowledge of General Education information will improve 03/27/2017 1259 - Progressing by Tomie ChinaJackson, Jordi Lacko Cecelie, RN   Education: Knowledge of General Education information will improve 03/27/2017 1259 - Progressing by Tomie ChinaJackson, Surya Schroeter Cecelie, RN

## 2017-03-28 LAB — CBC
HCT: 35 % (ref 35.0–47.0)
Hemoglobin: 11.8 g/dL — ABNORMAL LOW (ref 12.0–16.0)
MCH: 34.3 pg — AB (ref 26.0–34.0)
MCHC: 33.6 g/dL (ref 32.0–36.0)
MCV: 102.2 fL — ABNORMAL HIGH (ref 80.0–100.0)
PLATELETS: 123 10*3/uL — AB (ref 150–440)
RBC: 3.43 MIL/uL — ABNORMAL LOW (ref 3.80–5.20)
RDW: 13.7 % (ref 11.5–14.5)
WBC: 3.7 10*3/uL (ref 3.6–11.0)

## 2017-03-28 LAB — HEPATITIS C ANTIBODY: HCV Ab: 0.6 s/co ratio (ref 0.0–0.9)

## 2017-03-28 MED ORDER — ENSURE ENLIVE PO LIQD: 237.0000 mL | Freq: Two times a day (BID) | ORAL | 12 refills | Status: DC

## 2017-03-28 MED ORDER — FLUTICASONE-SALMETEROL 100-50 MCG/DOSE IN AEPB: 1.0000 | INHALATION_SPRAY | Freq: Two times a day (BID) | RESPIRATORY_TRACT | 0 refills | Status: DC

## 2017-03-28 MED ORDER — PREDNISONE 10 MG (21) PO TBPK: ORAL_TABLET | ORAL | 0 refills | Status: DC

## 2017-03-28 NOTE — Discharge Summary (Signed)
[facility] - [place] at [facility]   PATIENT NAME: Cynthia [name]    MR#:  [id number]  DATE OF BIRTH:  [birth year]  DATE OF ADMISSION:  03/26/2017 ADMITTING PHYSICIAN: Alford Highland, MD  DATE OF DISCHARGE: 03/28/2017   PRIMARY CARE PHYSICIAN: System, Pcp Not In    ADMISSION DIAGNOSIS:  Acute respiratory distress [R06.03] COPD exacerbation (HCC) [J44.1]  DISCHARGE DIAGNOSIS:  Active Problems:   COPD exacerbation (HCC)   SECONDARY DIAGNOSIS:   Past Medical History:  Diagnosis Date  . Blood clot in vein   . Coronary artery disease   . GERD (gastroesophageal reflux disease)   . Heart attack (HCC)   . Hx of CABG 2013  . Hyperlipidemia   . Hypertension     HOSPITAL COURSE:   1. COPD exacerbation. Solu-Medrol, budesonide and DuoNeb nebulizer solution.    Influenza negative, found to have metapneumovirus on respiratory panel.   Improved next day, d/c with oral tapering steroids and Advair. 2.Essential hypertension. Blood pressure is elevated likely because of her trouble breathing. Continue her usual medications and monitor closely. 3. History of CAD. Continue aspirin Plavix and beta-blocker. 4. Hyperlipidemia unspecified on pravastatin. 5. Tobacco abuse smoking cessation counseling done 4 minutes by me.  6. Pancytopenia send off hepatitis C- negative.      Her WBCs improved to > 3000 on discharge, she always had low WBCs as per husband.     DISCHARGE CONDITIONS:   Stable.  CONSULTS OBTAINED:    DRUG ALLERGIES:   Allergies  Allergen Reactions  . Penicillins Swelling and Other (See Comments)    Has patient had a PCN reaction causing immediate rash, facial/tongue/throat swelling, SOB or lightheadedness with hypotension: No Has patient had a PCN reaction causing severe rash involving mucus membranes or skin necrosis: No Has patient had a PCN reaction that required hospitalization: No Has patient had a PCN reaction occurring within  the last 10 years: No If all of the above answers are "NO", then may proceed with Cephalosporin use.      DISCHARGE MEDICATIONS:   Allergies as of 03/28/2017      Reactions   Penicillins Swelling, Other (See Comments)   Has patient had a PCN reaction causing immediate rash, facial/tongue/throat swelling, SOB or lightheadedness with hypotension: No Has patient had a PCN reaction causing severe rash involving mucus membranes or skin necrosis: No Has patient had a PCN reaction that required hospitalization: No Has patient had a PCN reaction occurring within the last 10 years: No If all of the above answers are "NO", then may proceed with Cephalosporin use.        Medication List    STOP taking these medications   predniSONE 20 MG tablet Commonly known as:  DELTASONE Replaced by:  predniSONE 10 MG (21) Tbpk tablet     TAKE these medications   albuterol (2.5 MG/3ML) 0.083% nebulizer solution Commonly known as:  PROVENTIL Take 2.5 mg by nebulization every 4 (four) hours as needed for wheezing or shortness of breath.   albuterol 108 (90 Base) MCG/ACT inhaler Commonly known as:  PROVENTIL HFA;VENTOLIN HFA Inhale 1-2 puffs into the lungs every 4 (four) hours as needed for wheezing or shortness of breath.   aspirin EC 81 MG tablet Take 81 mg by mouth daily.   clopidogrel 75 MG tablet Commonly known as:  PLAVIX Take 75 mg by mouth daily.   diclofenac sodium 1 % Gel Commonly known as:  VOLTAREN Apply 2 g topically 4 (four) times  daily.   DULoxetine 60 MG capsule Commonly known as:  CYMBALTA Take 60 mg by mouth daily.   enalapril 2.5 MG tablet Commonly known as:  VASOTEC Take 2.5 mg by mouth 2 (two) times daily.   feeding supplement (ENSURE ENLIVE) Liqd Take 237 mLs by mouth 2 (two) times daily between meals.   Fluticasone-Salmeterol 100-50 MCG/DOSE Aepb Commonly known as:  ADVAIR DISKUS Inhale 1 puff into the lungs 2 (two) times daily.   furosemide 40 MG  tablet Commonly known as:  LASIX Take 40 mg by mouth daily.   hydrALAZINE 25 MG tablet Commonly known as:  APRESOLINE Take 25 mg by mouth daily.   isosorbide mononitrate 30 MG 24 hr tablet Commonly known as:  IMDUR Take 30 mg by mouth daily.   latanoprost 0.005 % ophthalmic solution Commonly known as:  XALATAN Place 1 drop into both eyes at bedtime.   metoprolol succinate 25 MG 24 hr tablet Commonly known as:  TOPROL-XL Take 25 mg by mouth daily.   nitroGLYCERIN 0.3 MG SL tablet Commonly known as:  NITROSTAT Place 0.3 mg under the tongue every 5 (five) minutes as needed for chest pain.   oxyCODONE-acetaminophen 5-325 MG tablet Commonly known as:  PERCOCET/ROXICET Take 1 tablet by mouth 2 (two) times daily as needed for severe pain.   pravastatin 20 MG tablet Commonly known as:  PRAVACHOL Take 20 mg by mouth at bedtime.   predniSONE 10 MG (21) Tbpk tablet Commonly known as:  STERAPRED UNI-PAK 21 TAB Take 6 tabs first day, 5 tab on day 2, then 4 on day 3rd, 3 tabs on day 4th , 2 tab on day 5th, and 1 tab on 6th day. Replaces:  predniSONE 20 MG tablet   tiotropium 18 MCG inhalation capsule Commonly known as:  SPIRIVA Place 18 mcg into inhaler and inhale daily.   Vitamin D (Ergocalciferol) 50000 units Caps capsule Commonly known as:  DRISDOL Take 50,000 Units by mouth every 30 (thirty) days. Take on the first day of each month.        DISCHARGE INSTRUCTIONS:    Follow with PMD in 1-2 weeks.  If you experience worsening of your admission symptoms, develop shortness of breath, life threatening emergency, suicidal or homicidal thoughts you must seek medical attention immediately by calling 911 or calling your MD immediately  if symptoms less severe.  You Must read complete instructions/literature along with all the possible adverse reactions/side effects for all the Medicines you take and that have been prescribed to you. Take any new Medicines after you have  completely understood and accept all the possible adverse reactions/side effects.   Please note  You were cared for by a hospitalist during your hospital stay. If you have any questions about your discharge medications or the care you received while you were in the hospital after you are discharged, you can call the unit and asked to speak with the hospitalist on call if the hospitalist that took care of you is not available. Once you are discharged, your primary care physician will handle any further medical issues. Please note that NO REFILLS for any discharge medications will be authorized once you are discharged, as it is imperative that you return to your primary care physician (or establish a relationship with a primary care physician if you do not have one) for your aftercare needs so that they can reassess your need for medications and monitor your lab values.    Today   CHIEF COMPLAINT:  Chief Complaint  Patient presents with  . Shortness of Breath    HISTORY OF PRESENT ILLNESS:  Cynthia [name]  is a 77 y.o. female with a known history of presents with shortness of breath and chest pain.  Patient is audible wheezing despite being given Solu-Medrol and nebulizer treatments in the ED.  Patient complains of chest pain 9 out of 10 in intensity hurting bad.  She is not the greatest historian.  She has an audible wheeze.  She states she got a cold and started wheezing yesterday.  She is coughing up yellow phlegm.  She had a fall the other day some soreness in her right arm and shoulder.   VITAL SIGNS:  Blood pressure (!) 148/88, pulse 76, temperature 98.7 F (37.1 C), temperature source Oral, resp. rate 19, height 6' (1.829 m), weight 78 kg (172 lb), SpO2 98 %.  I/O:    Intake/Output Summary (Last 24 hours) at 03/28/2017 1006 Last data filed at 03/27/2017 1900 Gross per 24 hour  Intake 240 ml  Output -  Net 240 ml    PHYSICAL EXAMINATION:  GENERAL:  77 y.o.-year-old patient lying in  the bed with no acute distress.  EYES: Pupils equal, round, reactive to light and accommodation. No scleral icterus. Extraocular muscles intact.  HEENT: Head atraumatic, normocephalic. Oropharynx and nasopharynx clear.  NECK:  Supple, no jugular venous distention. No thyroid enlargement, no tenderness.  LUNGS: Normal breath sounds bilaterally, no wheezing, rales,rhonchi or crepitation. No use of accessory muscles of respiration.  CARDIOVASCULAR: S1, S2 normal. No murmurs, rubs, or gallops.  ABDOMEN: Soft, non-tender, non-distended. Bowel sounds present. No organomegaly or mass.  EXTREMITIES: No pedal edema, cyanosis, or clubbing.  NEUROLOGIC: Cranial nerves II through XII are intact. Muscle strength 4-5/5 in all extremities. Sensation intact. Gait not checked.  PSYCHIATRIC: The patient is alert and oriented x 3.  SKIN: No obvious rash, lesion, or ulcer.   DATA REVIEW:   CBC Recent Labs  Lab 03/28/17 0839  WBC 3.7  HGB 11.8*  HCT 35.0  PLT 123*    Chemistries  Recent Labs  Lab 03/26/17 1729 03/27/17 0333  NA 141 141  K 4.2 4.5  CL 105 107  CO2 29 24  GLUCOSE 105* 132*  BUN 25* 23*  CREATININE 1.29* 1.15*  CALCIUM 8.6* 9.0  AST 23  --   ALT 21  --   ALKPHOS 135*  --   BILITOT 0.9  --     Cardiac Enzymes Recent Labs  Lab 03/27/17 0333  TROPONINI <0.03    Microbiology Results  Results for orders placed or performed during the hospital encounter of 03/26/17  Respiratory Panel by PCR     Status: Abnormal   Collection Time: 03/26/17  8:23 PM  Result Value Ref Range Status   Adenovirus NOT DETECTED NOT DETECTED Final   Coronavirus 229E NOT DETECTED NOT DETECTED Final   Coronavirus HKU1 NOT DETECTED NOT DETECTED Final   Coronavirus NL63 NOT DETECTED NOT DETECTED Final   Coronavirus OC43 NOT DETECTED NOT DETECTED Final   Metapneumovirus DETECTED (A) NOT DETECTED Final   Rhinovirus / Enterovirus NOT DETECTED NOT DETECTED Final   Influenza A NOT DETECTED NOT DETECTED  Final   Influenza B NOT DETECTED NOT DETECTED Final   Parainfluenza Virus 1 NOT DETECTED NOT DETECTED Final   Parainfluenza Virus 2 NOT DETECTED NOT DETECTED Final   Parainfluenza Virus 3 NOT DETECTED NOT DETECTED Final   Parainfluenza Virus 4 NOT DETECTED NOT DETECTED Final  Respiratory Syncytial Virus NOT DETECTED NOT DETECTED Final   Bordetella pertussis NOT DETECTED NOT DETECTED Final   Chlamydophila pneumoniae NOT DETECTED NOT DETECTED Final   Mycoplasma pneumoniae NOT DETECTED NOT DETECTED Final    RADIOLOGY:  Dg Chest 2 View  Result Date: 03/26/2017 CLINICAL DATA:  Patient with shortness of breath. EXAM: CHEST  2 VIEW COMPARISON:  Chest radiograph 06/10/2015 FINDINGS: Monitoring leads overlie the patient. Stable cardiomegaly status post median sternotomy. Aortic atherosclerosis. No consolidative pulmonary opacities. No pleural effusion or pneumothorax. Regional skeleton is unremarkable. IMPRESSION: No acute cardiopulmonary process.  Cardiomegaly. Electronically Signed   By: Annia Belt M.D.   On: 03/26/2017 18:26    EKG:   Orders placed or performed during the hospital encounter of 03/26/17  . EKG 12-Lead  . EKG 12-Lead      Management plans discussed with the patient, family and they are in agreement.  CODE STATUS:     Code Status Orders  (From admission, onward)        Start     Ordered   03/26/17 2049  Full code  Continuous     03/26/17 2049    Code Status History    Date Active Date Inactive Code Status Order ID Comments User Context   04/30/2016 01:33 05/01/2016 15:26 Full Code [id number]  Oralia Manis, MD ED   09/08/2014 17:19 09/09/2014 20:11 Full Code [id number]  Shaune Pollack, MD Inpatient      TOTAL TIME TAKING CARE OF THIS PATIENT: 35 minutes.    Altamese Dilling M.D on 03/28/2017 at 10:06 AM  Between 7am to 6pm - Pager - [phone]  After 6pm go to [email or web address] - password [email or web address]  [facility]  Office   [phone]  CC: Primary care physician; System, Pcp Not In   Note: This dictation was prepared with Dragon dictation along with smaller phrase technology. Any transcriptional errors that result from this process are unintentional.

## 2017-03-28 NOTE — Plan of Care (Signed)
Patient is short of breath with upper airway wheezing with minimal exertion.

## 2017-03-28 NOTE — Discharge Planning (Signed)
Patient IV removed.  RN assessment and VS revealed stability for DC to home with husband and [facility].  Discharge papers given, explained and educated.  Informed of s/sx to call Dr. With and/or pace.  Given paper scripts.  Wheeled to front and family transporting home via car.

## 2017-03-28 NOTE — Care Management (Signed)
[facility] patient. Spoke with Mady GemmaJanice Braxton, SW. Informed her of discharge. Requested patient have RN and PT. She will have this arranged through [facility] program.

## 2017-03-31 ENCOUNTER — Emergency Department
Admission: EM | Admit: 2017-03-31 | Discharge: 2017-03-31 | Disposition: A | Discharge status: Home / Self Care | Payer: Medicare (Managed Care) | Attending: Emergency Medicine | Admitting: Emergency Medicine

## 2017-03-31 ENCOUNTER — Emergency Department: Payer: Medicare (Managed Care)

## 2017-03-31 DIAGNOSIS — N390 Urinary tract infection, site not specified: Secondary | ICD-10-CM | POA: Diagnosis not present

## 2017-03-31 DIAGNOSIS — Z79899 Other long term (current) drug therapy: Secondary | ICD-10-CM | POA: Insufficient documentation

## 2017-03-31 DIAGNOSIS — I119 Hypertensive heart disease without heart failure: Secondary | ICD-10-CM | POA: Diagnosis not present

## 2017-03-31 DIAGNOSIS — F0390 Unspecified dementia without behavioral disturbance: Secondary | ICD-10-CM | POA: Insufficient documentation

## 2017-03-31 DIAGNOSIS — F17210 Nicotine dependence, cigarettes, uncomplicated: Secondary | ICD-10-CM | POA: Diagnosis not present

## 2017-03-31 DIAGNOSIS — Z7982 Long term (current) use of aspirin: Secondary | ICD-10-CM | POA: Insufficient documentation

## 2017-03-31 DIAGNOSIS — R51 Headache: Secondary | ICD-10-CM | POA: Diagnosis present

## 2017-03-31 DIAGNOSIS — I2510 Atherosclerotic heart disease of native coronary artery without angina pectoris: Secondary | ICD-10-CM | POA: Diagnosis not present

## 2017-03-31 DIAGNOSIS — J449 Chronic obstructive pulmonary disease, unspecified: Secondary | ICD-10-CM | POA: Insufficient documentation

## 2017-03-31 DIAGNOSIS — W19XXXA Unspecified fall, initial encounter: Secondary | ICD-10-CM

## 2017-03-31 LAB — CBC WITH DIFFERENTIAL/PLATELET
Basophils Absolute: 0 10*3/uL (ref 0–0.1)
Basophils Relative: 0 %
EOS ABS: 0 10*3/uL (ref 0–0.7)
EOS PCT: 1 %
HCT: 38 % (ref 35.0–47.0)
Hemoglobin: 12.4 g/dL (ref 12.0–16.0)
LYMPHS ABS: 1 10*3/uL (ref 1.0–3.6)
Lymphocytes Relative: 29 %
MCH: 33.5 pg (ref 26.0–34.0)
MCHC: 32.5 g/dL (ref 32.0–36.0)
MCV: 103.2 fL — ABNORMAL HIGH (ref 80.0–100.0)
MONO ABS: 0.4 10*3/uL (ref 0.2–0.9)
MONOS PCT: 13 %
Neutro Abs: 2.1 10*3/uL (ref 1.4–6.5)
Neutrophils Relative %: 57 %
PLATELETS: 165 10*3/uL (ref 150–440)
RBC: 3.68 MIL/uL — ABNORMAL LOW (ref 3.80–5.20)
RDW: 13.6 % (ref 11.5–14.5)
WBC: 3.6 10*3/uL (ref 3.6–11.0)

## 2017-03-31 LAB — URINALYSIS, COMPLETE (UACMP) WITH MICROSCOPIC
Bilirubin Urine: NEGATIVE
GLUCOSE, UA: NEGATIVE mg/dL
Ketones, ur: NEGATIVE mg/dL
Nitrite: NEGATIVE
PH: 7 (ref 5.0–8.0)
Protein, ur: 30 mg/dL — AB
Specific Gravity, Urine: 1.018 (ref 1.005–1.030)

## 2017-03-31 LAB — BASIC METABOLIC PANEL
Anion gap: 9 (ref 5–15)
BUN: 25 mg/dL — AB (ref 6–20)
CO2: 27 mmol/L (ref 22–32)
CREATININE: 1.22 mg/dL — AB (ref 0.44–1.00)
Calcium: 8.9 mg/dL (ref 8.9–10.3)
Chloride: 104 mmol/L (ref 101–111)
GFR calc Af Amer: 49 mL/min — ABNORMAL LOW (ref >60)
GFR, EST NON AFRICAN AMERICAN: 42 mL/min — AB (ref >60)
Glucose, Bld: 94 mg/dL (ref 65–99)
Potassium: 4.4 mmol/L (ref 3.5–5.1)
SODIUM: 140 mmol/L (ref 135–145)

## 2017-03-31 LAB — TROPONIN I
TROPONIN I: 0.03 ng/mL — AB (ref <0.03)
Troponin I: 0.03 ng/mL (ref <0.03)

## 2017-03-31 MED ORDER — CEPHALEXIN 500 MG PO CAPS: 500.0000 mg | ORAL_CAPSULE | Freq: Three times a day (TID) | ORAL | 0 refills | Status: DC

## 2017-03-31 MED ORDER — PROCHLORPERAZINE EDISYLATE 5 MG/ML IJ SOLN
10.0000 mg | Freq: Once | INTRAMUSCULAR | Status: AC
  Administered 2017-03-31: 10 mg via INTRAVENOUS
  Filled 2017-03-31: qty 2

## 2017-03-31 MED ORDER — SODIUM CHLORIDE 0.9 % IV SOLN
1.0000 g | Freq: Once | INTRAVENOUS | Status: AC
  Administered 2017-03-31: 1 g via INTRAVENOUS
  Filled 2017-03-31: qty 10

## 2017-03-31 NOTE — Discharge Instructions (Signed)
Please seek medical attention for any high fevers, chest pain, shortness of breath, change in behavior, persistent vomiting, bloody stool or any other new or concerning symptoms.  

## 2017-03-31 NOTE — ED Notes (Signed)
[facility] MD came to station and spoke with this RN. They will provide transport at D/C. Dr. Shelda Palim McGrath instructed this RN to call [phone] and ask for transport at the time of D/C and to also notify him at [phone].  RN will monitor.

## 2017-03-31 NOTE — ED Notes (Signed)
Labs sent at this time. ([place])

## 2017-03-31 NOTE — ED Triage Notes (Signed)
Pt presents today via [facility] for mechanical fall off of toilet. Pt is A/ox4 but is complaining of L/S pain and HA. Pt has a hx of CVA, Stroke scale Negative at this time.

## 2017-03-31 NOTE — ED Provider Notes (Signed)
[facility] Emergency Department Provider Note   ____________________________________________   I have reviewed the triage vital signs and the nursing notes.   HISTORY  Chief Complaint Fall   History limited by and level 5 caveat due to: Dementia   HPI Cynthia [name] is a 77 y.o. female who presents to the emergency department today after a fall.  The patient does have dementia.  Patient cannot say why she fell.  She states that she did hit her head and she has had subsequent headache.  She does not remember if she had any palpitations during the fall.  She does not remember if she was lightheaded.  She is unsure if she tripped.  Per medical record review patient has a history of CAD, HTN.  Past Medical History:  Diagnosis Date  . Blood clot in vein   . Coronary artery disease   . GERD (gastroesophageal reflux disease)   . Heart attack (HCC)   . Hx of CABG 2013  . Hyperlipidemia   . Hypertension     Patient Active Problem List   Diagnosis Date Noted  . COPD exacerbation (HCC) 03/26/2017  . Pelvic fracture (HCC) 04/30/2016  . HTN (hypertension) 04/30/2016  . HLD (hyperlipidemia) 04/30/2016  . GERD (gastroesophageal reflux disease) 04/30/2016  . CAD (coronary artery disease) 04/30/2016  . Chest pain 09/08/2014  . Dehydration 09/08/2014    Past Surgical History:  Procedure Laterality Date  . CARDIAC SURGERY    . HIP SURGERY Left 12/2014  . SHOULDER SURGERY Bilateral     Prior to Admission medications   Medication Sig Start Date End Date Taking? Authorizing Provider  aspirin EC 81 MG tablet Take 81 mg by mouth daily.   Yes [provider]  clopidogrel (PLAVIX) 75 MG tablet Take 75 mg by mouth daily.   Yes [provider]  DULoxetine (CYMBALTA) 60 MG capsule Take 60 mg by mouth daily.   Yes [provider]  enalapril (VASOTEC) 2.5 MG tablet Take 2.5 mg by mouth 2 (two) times daily.   Yes [provider]  feeding supplement, ENSURE ENLIVE, (ENSURE ENLIVE) LIQD Take 237 mLs by mouth 2 (two) times daily between meals. 03/28/17  Yes Altamese Dilling, MD  Fluticasone-Salmeterol (ADVAIR DISKUS) 100-50 MCG/DOSE AEPB Inhale 1 puff into the lungs 2 (two) times daily. 03/28/17 03/28/18 Yes Altamese Dilling, MD  furosemide (LASIX) 40 MG tablet Take 40 mg by mouth daily.    Yes [provider]  hydrALAZINE (APRESOLINE) 25 MG tablet Take 25 mg by mouth daily.    Yes [provider]  isosorbide mononitrate (IMDUR) 30 MG 24 hr tablet Take 30 mg by mouth daily.   Yes [provider]  latanoprost (XALATAN) 0.005 % ophthalmic solution Place 1 drop into both eyes at bedtime.   Yes [provider]  metoprolol succinate (TOPROL-XL) 25 MG 24 hr tablet Take 25 mg by mouth daily.   Yes [provider]  pravastatin (PRAVACHOL) 20 MG tablet Take 20 mg by mouth at bedtime.   Yes [provider]  predniSONE (STERAPRED UNI-PAK 21 TAB) 10 MG (21) TBPK tablet Take 6 tabs first day, 5 tab on day 2, then 4 on day 3rd, 3 tabs on day 4th , 2 tab on day 5th, and 1 tab on 6th day. Patient taking differently: Take 10 mg by mouth daily. Take 6 tabs first day, 5 tab on day 2, then 4 on day 3rd, 3 tabs on day 4th ,  2 tab on day 5th, and 1 tab on 6th day. 03/28/17  Yes Altamese Dilling, MD  tiotropium (SPIRIVA) 18 MCG inhalation capsule Place 18 mcg into inhaler and inhale daily.   Yes [provider]  Vitamin D, Ergocalciferol, (DRISDOL) 50000 units CAPS capsule Take 50,000 Units by mouth every 30 (thirty) days. Take on the first day of each month.   Yes [provider]  albuterol (PROVENTIL HFA;VENTOLIN HFA) 108 (90 Base) MCG/ACT inhaler Inhale 1-2 puffs into the lungs every 4 (four) hours as needed for wheezing or shortness of breath.    [provider]  albuterol (PROVENTIL) (2.5 MG/3ML) 0.083% nebulizer solution Take 2.5 mg by nebulization every  4 (four) hours as needed for wheezing or shortness of breath.    [provider]  diclofenac sodium (VOLTAREN) 1 % GEL Apply 2 g topically 4 (four) times daily. 04/29/16   Phineas Semen, MD  nitroGLYCERIN (NITROSTAT) 0.3 MG SL tablet Place 0.3 mg under the tongue every 5 (five) minutes as needed for chest pain.    [provider]  oxyCODONE-acetaminophen (PERCOCET/ROXICET) 5-325 MG tablet Take 1 tablet by mouth 2 (two) times daily as needed for severe pain.    [provider]    Allergies Penicillins  Family History  Problem Relation Age of Onset  . Hypertension Other   . Alcohol abuse Other     Social History Social History   Tobacco Use  . Smoking status: Current Every Day Smoker    Packs/day: 0.10    Years: 35.00    Pack years: 3.50    Types: Cigarettes  . Smokeless tobacco: Never Used  Substance Use Topics  . Alcohol use: No  . Drug use: No    Review of Systems Constitutional: No fever/chills Eyes: No visual changes. ENT: No sore throat. Cardiovascular: Denies chest pain. Respiratory: Denies shortness of breath. Gastrointestinal: No abdominal pain.  No nausea, no vomiting.  No diarrhea.   Genitourinary: Negative for dysuria. Musculoskeletal: Positive for left hip pain. Skin: Negative for rash. Neurological: Positive for headache.  ____________________________________________   PHYSICAL EXAM:  VITAL SIGNS: ED Triage Vitals  Enc Vitals Group     BP 03/31/17 0826 139/74     Pulse Rate 03/31/17 0830 62     Resp 03/31/17 0830 12     Temp 03/31/17 0830 97.9 F (36.6 C)     Temp src --      SpO2 03/31/17 0826 96 %     Weight 03/31/17 0827 172 lb (78 kg)     Height 03/31/17 0827 6' (1.829 m)     Head Circumference --      Peak Flow --      Pain Score 03/31/17 0827 9   Constitutional: Alert and alert. Not oriented to events. Well appearing and in no distress. Eyes: Conjunctivae are normal.  ENT   Head: Normocephalic and  atraumatic.   Nose: No congestion/rhinnorhea.   Mouth/Throat: Mucous membranes are moist.   Neck: No stridor. Hematological/Lymphatic/Immunilogical: No cervical lymphadenopathy. Cardiovascular: Normal rate, regular rhythm.  No murmurs, rubs, or gallops. Respiratory: Normal respiratory effort without tachypnea nor retractions. Breath sounds are clear and equal bilaterally. No wheezes/rales/rhonchi. Gastrointestinal: Soft and non tender. No rebound. No guarding.  Genitourinary: Deferred Musculoskeletal: Normal range of motion in all extremities. No lower extremity edema. Neurologic:  Demented. Moving all extremities.  Skin:  Skin is warm, dry and intact. No rash noted. Psychiatric: Mood and affect are normal. Speech and behavior are normal.  Patient exhibits appropriate insight and judgment.  ____________________________________________    LABS (pertinent positives/negatives)  UA hazy, moderate leukocytes, 6-30 wbcs and rbcs Trop 0.03 x 2 BMP cr 1.22, bun 25, k 4.4, na 140 CBC wbc 3.6, hgb 12.4, plt 165  ____________________________________________   EKG  I, Phineas SemenGraydon Sarya Linenberger, attending physician, personally viewed and interpreted this EKG  EKG Time: [id number] Rate: 109 Rhythm: sinus rhythm with pac Axis: normal Intervals: qtc 512 QRS: RBBB, LPFB ST changes: no st elevation Impression: abnormal ekg   ____________________________________________    RADIOLOGY  CT head Normal  Left hip No acute process  ____________________________________________   PROCEDURES  Procedures  ____________________________________________   INITIAL IMPRESSION / ASSESSMENT AND PLAN / ED COURSE  Pertinent labs & imaging results that were available during my care of the patient were reviewed by me and considered in my medical decision making (see chart for details).  Patient presented to the emergency department today after a fall.  Patient complaining of some left hip pain.  CT  scan of the head was negative for any bleed or traumatic findings.  Hip x-ray was negative.  Blood work was checked to see if there was an obvious etiology of the patient's fall.  Troponin initially elevated 0.03.  Had a discussion with the patient and family about rechecking troponin.  She denies any chest pain.  Repeat troponin was 0.03.  At this point I doubt ACS.  Patient's urine was concerning for possible urinary tract infection will give dose of antibiotics and plan on discharging home with further antibiotics.  __________________________   FINAL CLINICAL IMPRESSION(S) / ED DIAGNOSES  Final diagnoses:  Fall, initial encounter  Lower urinary tract infectious disease     Note: This dictation was prepared with Nurse, children'sDragon dictation. Any transcriptional errors that result from this process are unintentional     Phineas SemenGoodman, Jayzen Paver, MD 04/01/17 1115

## 2017-03-31 NOTE — ED Notes (Signed)
Contacted caregiver to pick patient up from ED.

## 2017-03-31 NOTE — ED Notes (Signed)
Date and time results received: 03/31/17 0956   Test: Trop Critical Value: 0.03  Name of Provider Notified: Derrill KayGoodman

## 2017-04-01 LAB — URINE CULTURE: Culture: NO GROWTH

## 2017-07-05 ENCOUNTER — Emergency Department: Payer: Medicare (Managed Care)

## 2017-07-05 ENCOUNTER — Observation Stay
Admission: EM | Admit: 2017-07-05 | Discharge: 2017-07-06 | Disposition: A | Discharge status: Home / Self Care | Payer: Medicare (Managed Care) | Attending: Internal Medicine | Admitting: Internal Medicine

## 2017-07-05 ENCOUNTER — Encounter: Payer: Self-pay | Admitting: *Deleted

## 2017-07-05 ENCOUNTER — Other Ambulatory Visit: Payer: Self-pay

## 2017-07-05 ENCOUNTER — Observation Stay (HOSPITAL_BASED_OUTPATIENT_CLINIC_OR_DEPARTMENT_OTHER): Payer: Medicare (Managed Care)

## 2017-07-05 DIAGNOSIS — E785 Hyperlipidemia, unspecified: Secondary | ICD-10-CM | POA: Insufficient documentation

## 2017-07-05 DIAGNOSIS — Z7902 Long term (current) use of antithrombotics/antiplatelets: Secondary | ICD-10-CM | POA: Diagnosis not present

## 2017-07-05 DIAGNOSIS — I252 Old myocardial infarction: Secondary | ICD-10-CM | POA: Insufficient documentation

## 2017-07-05 DIAGNOSIS — Z7982 Long term (current) use of aspirin: Secondary | ICD-10-CM | POA: Diagnosis not present

## 2017-07-05 DIAGNOSIS — F17210 Nicotine dependence, cigarettes, uncomplicated: Secondary | ICD-10-CM | POA: Diagnosis not present

## 2017-07-05 DIAGNOSIS — E860 Dehydration: Secondary | ICD-10-CM | POA: Diagnosis not present

## 2017-07-05 DIAGNOSIS — R079 Chest pain, unspecified: Secondary | ICD-10-CM

## 2017-07-05 DIAGNOSIS — K219 Gastro-esophageal reflux disease without esophagitis: Secondary | ICD-10-CM | POA: Diagnosis not present

## 2017-07-05 DIAGNOSIS — Z880 Allergy status to penicillin: Secondary | ICD-10-CM | POA: Diagnosis not present

## 2017-07-05 DIAGNOSIS — Z79899 Other long term (current) drug therapy: Secondary | ICD-10-CM | POA: Insufficient documentation

## 2017-07-05 DIAGNOSIS — Z951 Presence of aortocoronary bypass graft: Secondary | ICD-10-CM | POA: Diagnosis not present

## 2017-07-05 DIAGNOSIS — I2510 Atherosclerotic heart disease of native coronary artery without angina pectoris: Principal | ICD-10-CM | POA: Insufficient documentation

## 2017-07-05 LAB — CBC
HEMATOCRIT: 37.8 % (ref 35.0–47.0)
Hemoglobin: 12.6 g/dL (ref 12.0–16.0)
MCH: 33.6 pg (ref 26.0–34.0)
MCHC: 33.4 g/dL (ref 32.0–36.0)
MCV: 100.8 fL — AB (ref 80.0–100.0)
PLATELETS: 180 10*3/uL (ref 150–440)
RBC: 3.75 MIL/uL — AB (ref 3.80–5.20)
RDW: 13.7 % (ref 11.5–14.5)
WBC: 3.5 10*3/uL — ABNORMAL LOW (ref 3.6–11.0)

## 2017-07-05 LAB — TROPONIN I
TROPONIN I: 0.03 ng/mL — AB (ref <0.03)
Troponin I: 0.03 ng/mL (ref <0.03)
Troponin I: 0.03 ng/mL (ref <0.03)

## 2017-07-05 LAB — BASIC METABOLIC PANEL
Anion gap: 7 (ref 5–15)
BUN: 25 mg/dL — AB (ref 6–20)
CHLORIDE: 105 mmol/L (ref 101–111)
CO2: 29 mmol/L (ref 22–32)
CREATININE: 1.51 mg/dL — AB (ref 0.44–1.00)
Calcium: 8.9 mg/dL (ref 8.9–10.3)
GFR calc Af Amer: 38 mL/min — ABNORMAL LOW (ref >60)
GFR calc non Af Amer: 32 mL/min — ABNORMAL LOW (ref >60)
Glucose, Bld: 84 mg/dL (ref 65–99)
POTASSIUM: 4 mmol/L (ref 3.5–5.1)
Sodium: 141 mmol/L (ref 135–145)

## 2017-07-05 LAB — NM MYOCAR MULTI W/SPECT W/WALL MOTION / EF
LV sys vol: 107 mL
LVDIAVOL: 167 mL (ref 46–106)
Peak HR: 81 {beats}/min
Percent HR: 56 %
Rest HR: 71 {beats}/min
TID: 1.08

## 2017-07-05 LAB — FOLATE: FOLATE: 8.3 ng/mL (ref >5.9)

## 2017-07-05 LAB — VITAMIN B12: VITAMIN B 12: 193 pg/mL (ref 180–914)

## 2017-07-05 MED ORDER — MOMETASONE FURO-FORMOTEROL FUM 100-5 MCG/ACT IN AERO
2.0000 | INHALATION_SPRAY | Freq: Two times a day (BID) | RESPIRATORY_TRACT | Status: DC
  Administered 2017-07-05 – 2017-07-06 (×3): 2 via RESPIRATORY_TRACT
  Filled 2017-07-05: qty 8.8

## 2017-07-05 MED ORDER — ISOSORBIDE MONONITRATE ER 30 MG PO TB24
30.0000 mg | ORAL_TABLET | Freq: Every day | ORAL | Status: DC
  Administered 2017-07-05 – 2017-07-06 (×2): 30 mg via ORAL
  Filled 2017-07-05 (×2): qty 1

## 2017-07-05 MED ORDER — IPRATROPIUM-ALBUTEROL 0.5-2.5 (3) MG/3ML IN SOLN
6.0000 mL | Freq: Four times a day (QID) | RESPIRATORY_TRACT | Status: DC
  Administered 2017-07-05 – 2017-07-06 (×5): 6 mL via RESPIRATORY_TRACT
  Filled 2017-07-05 (×2): qty 6
  Filled 2017-07-05: qty 3
  Filled 2017-07-05: qty 6
  Filled 2017-07-05: qty 3

## 2017-07-05 MED ORDER — HYDRALAZINE HCL 25 MG PO TABS
25.0000 mg | ORAL_TABLET | Freq: Every day | ORAL | Status: DC
  Administered 2017-07-05 – 2017-07-06 (×2): 25 mg via ORAL
  Filled 2017-07-05 (×2): qty 1

## 2017-07-05 MED ORDER — TIOTROPIUM BROMIDE MONOHYDRATE 18 MCG IN CAPS
18.0000 ug | ORAL_CAPSULE | Freq: Every day | RESPIRATORY_TRACT | Status: DC
  Administered 2017-07-05 – 2017-07-06 (×2): 18 ug via RESPIRATORY_TRACT
  Filled 2017-07-05: qty 5

## 2017-07-05 MED ORDER — BISACODYL 5 MG PO TBEC: 5.0000 mg | DELAYED_RELEASE_TABLET | Freq: Every day | ORAL | Status: DC | PRN

## 2017-07-05 MED ORDER — ENOXAPARIN SODIUM 40 MG/0.4ML ~~LOC~~ SOLN
40.0000 mg | SUBCUTANEOUS | Status: DC
  Administered 2017-07-05: 40 mg via SUBCUTANEOUS
  Filled 2017-07-05: qty 0.4

## 2017-07-05 MED ORDER — MORPHINE SULFATE (PF) 2 MG/ML IV SOLN
2.0000 mg | INTRAVENOUS | Status: DC | PRN
  Administered 2017-07-05 – 2017-07-06 (×2): 2 mg via INTRAVENOUS
  Filled 2017-07-05 (×2): qty 1

## 2017-07-05 MED ORDER — ACETAMINOPHEN 325 MG PO TABS
650.0000 mg | ORAL_TABLET | ORAL | Status: DC | PRN
  Filled 2017-07-05: qty 2

## 2017-07-05 MED ORDER — SENNOSIDES-DOCUSATE SODIUM 8.6-50 MG PO TABS: 1.0000 | ORAL_TABLET | Freq: Every evening | ORAL | Status: DC | PRN

## 2017-07-05 MED ORDER — MORPHINE SULFATE (PF) 4 MG/ML IV SOLN
4.0000 mg | INTRAVENOUS | Status: DC | PRN
  Administered 2017-07-05: 4 mg via INTRAVENOUS
  Filled 2017-07-05: qty 1

## 2017-07-05 MED ORDER — IPRATROPIUM-ALBUTEROL 0.5-2.5 (3) MG/3ML IN SOLN
RESPIRATORY_TRACT | Status: AC
  Filled 2017-07-05: qty 6

## 2017-07-05 MED ORDER — LOSARTAN POTASSIUM 25 MG PO TABS
25.0000 mg | ORAL_TABLET | Freq: Every day | ORAL | Status: DC
  Administered 2017-07-05 – 2017-07-06 (×2): 25 mg via ORAL
  Filled 2017-07-05 (×2): qty 1

## 2017-07-05 MED ORDER — METOPROLOL SUCCINATE ER 25 MG PO TB24
25.0000 mg | ORAL_TABLET | Freq: Every day | ORAL | Status: DC
  Administered 2017-07-05 – 2017-07-06 (×2): 25 mg via ORAL
  Filled 2017-07-05 (×2): qty 1

## 2017-07-05 MED ORDER — FUROSEMIDE 40 MG PO TABS
40.0000 mg | ORAL_TABLET | Freq: Every day | ORAL | Status: DC
  Administered 2017-07-05 – 2017-07-06 (×2): 40 mg via ORAL
  Filled 2017-07-05 (×2): qty 1

## 2017-07-05 MED ORDER — LATANOPROST 0.005 % OP SOLN
1.0000 [drp] | Freq: Every day | OPHTHALMIC | Status: DC
  Administered 2017-07-05: 1 [drp] via OPHTHALMIC
  Filled 2017-07-05: qty 2.5

## 2017-07-05 MED ORDER — ONDANSETRON HCL 4 MG/2ML IJ SOLN: 4.0000 mg | Freq: Four times a day (QID) | INTRAMUSCULAR | Status: DC | PRN

## 2017-07-05 MED ORDER — ALBUTEROL SULFATE (2.5 MG/3ML) 0.083% IN NEBU
2.5000 mg | INHALATION_SOLUTION | RESPIRATORY_TRACT | Status: DC | PRN
Start: 2017-07-05 — End: 2017-07-06
  Administered 2017-07-05: 2.5 mg via RESPIRATORY_TRACT
  Filled 2017-07-05: qty 3

## 2017-07-05 MED ORDER — NITROGLYCERIN 0.4 MG SL SUBL: 0.4000 mg | SUBLINGUAL_TABLET | SUBLINGUAL | Status: DC | PRN

## 2017-07-05 MED ORDER — TECHNETIUM TC 99M TETROFOSMIN IV KIT
14.4350 | PACK | Freq: Once | INTRAVENOUS | Status: AC | PRN
  Administered 2017-07-05: 14.435 via INTRAVENOUS

## 2017-07-05 MED ORDER — REGADENOSON 0.4 MG/5ML IV SOLN
0.4000 mg | Freq: Once | INTRAVENOUS | Status: AC
  Administered 2017-07-05: 0.4 mg via INTRAVENOUS

## 2017-07-05 MED ORDER — IPRATROPIUM-ALBUTEROL 0.5-2.5 (3) MG/3ML IN SOLN
6.0000 mL | Freq: Once | RESPIRATORY_TRACT | Status: AC
  Administered 2017-07-05: 6 mL via RESPIRATORY_TRACT

## 2017-07-05 MED ORDER — ASPIRIN EC 81 MG PO TBEC
81.0000 mg | DELAYED_RELEASE_TABLET | Freq: Every day | ORAL | Status: DC
  Administered 2017-07-05 – 2017-07-06 (×2): 81 mg via ORAL
  Filled 2017-07-05 (×2): qty 1

## 2017-07-05 MED ORDER — PRAVASTATIN SODIUM 20 MG PO TABS
20.0000 mg | ORAL_TABLET | Freq: Every day | ORAL | Status: DC
  Administered 2017-07-05: 20 mg via ORAL
  Filled 2017-07-05: qty 1

## 2017-07-05 MED ORDER — ENSURE ENLIVE PO LIQD
237.0000 mL | Freq: Two times a day (BID) | ORAL | Status: DC
  Administered 2017-07-05: 237 mL via ORAL

## 2017-07-05 MED ORDER — TECHNETIUM TC 99M TETROFOSMIN IV KIT
32.2510 | PACK | Freq: Once | INTRAVENOUS | Status: AC | PRN
  Administered 2017-07-05: 32.251 via INTRAVENOUS

## 2017-07-05 MED ORDER — METHYLPREDNISOLONE SODIUM SUCC 125 MG IJ SOLR
60.0000 mg | Freq: Two times a day (BID) | INTRAMUSCULAR | Status: DC
  Administered 2017-07-05 – 2017-07-06 (×3): 60 mg via INTRAVENOUS
  Filled 2017-07-05 (×3): qty 2

## 2017-07-05 MED ORDER — ENALAPRIL MALEATE 2.5 MG PO TABS
2.5000 mg | ORAL_TABLET | Freq: Two times a day (BID) | ORAL | Status: DC
  Filled 2017-07-05 (×2): qty 1

## 2017-07-05 MED ORDER — CLOPIDOGREL BISULFATE 75 MG PO TABS
75.0000 mg | ORAL_TABLET | Freq: Every day | ORAL | Status: DC
  Administered 2017-07-05 – 2017-07-06 (×2): 75 mg via ORAL
  Filled 2017-07-05 (×2): qty 1

## 2017-07-05 MED ORDER — DULOXETINE HCL 30 MG PO CPEP
60.0000 mg | ORAL_CAPSULE | Freq: Every day | ORAL | Status: DC
  Administered 2017-07-05 – 2017-07-06 (×2): 60 mg via ORAL
  Filled 2017-07-05 (×2): qty 2

## 2017-07-05 NOTE — Plan of Care (Signed)
  Problem: Education: Goal: Knowledge of General Education information will improve Outcome: Progressing   Problem: Clinical Measurements: Goal: Ability to maintain clinical measurements within normal limits will improve Outcome: Progressing Goal: Will remain free from infection Outcome: Progressing Goal: Respiratory complications will improve Outcome: Progressing

## 2017-07-05 NOTE — ED Triage Notes (Signed)
Arrived from home via EMS with onset on Chest Pain approx 1.5 hrs ago. Pt has an extensive cardiac hx. Pt took 3 nitros at home. En route received 324 aspirin and 1 duoneb. Pt is A&O and able to give hx

## 2017-07-05 NOTE — H&P (Addendum)
[facility] [place] [facility]   PATIENT NAME: Cynthia [name]    MR#:  [id number]  DATE OF BIRTH:  06/13/1940  DATE OF ADMISSION:  07/05/2017  PRIMARY CARE PHYSICIAN: System, Pcp Not In   REQUESTING/REFERRING PHYSICIAN: Darci Currentandolph N Brown, MD  CHIEF COMPLAINT:  No chief complaint on file.   HISTORY OF PRESENT ILLNESS:  Cynthia [name]  is a 77 y.o. female with a known history of CAD p/w CP. Endorses CP onset @~2030PM, waking pt up from sleep. CP characterized both as sharp and dull, right-sided, coming/going, non-positional, non-reproducible, non-exertional, non-reproducible, non-pleuritic. Endorses 3-4wk Hx cough, non-productive, (+) SOB. Is on ACE-I (Enalapril 2.5 BID). Is an active smoker. Hoarse voice x2d, (-) sore throat. Denies fever, chills, diaphoresis, night sweats, rigors, N/V, LH/LOC. Her lungs are clear.  PAST MEDICAL HISTORY:   Past Medical History:  Diagnosis Date  . Blood clot in vein   . Coronary artery disease   . GERD (gastroesophageal reflux disease)   . Heart attack (HCC)   . Hx of CABG 2013  . Hyperlipidemia   . Hypertension     PAST SURGICAL HISTORY:   Past Surgical History:  Procedure Laterality Date  . CARDIAC SURGERY    . HIP SURGERY Left 12/2014  . SHOULDER SURGERY Bilateral     SOCIAL HISTORY:   Social History   Tobacco Use  . Smoking status: Current Every Day Smoker    Packs/day: 0.10    Years: 35.00    Pack years: 3.50    Types: Cigarettes  . Smokeless tobacco: Never Used  Substance Use Topics  . Alcohol use: No    FAMILY HISTORY:   Family History  Problem Relation Age of Onset  . Hypertension Other   . Alcohol abuse Other     DRUG ALLERGIES:   Allergies  Allergen Reactions  . Penicillins Swelling and Other (See Comments)    Has patient had a PCN reaction causing immediate rash, facial/tongue/throat swelling, SOB or lightheadedness with hypotension: No Has patient had a PCN reaction causing severe rash  involving mucus membranes or skin necrosis: No Has patient had a PCN reaction that required hospitalization: No Has patient had a PCN reaction occurring within the last 10 years: No If all of the above answers are "NO", then may proceed with Cephalosporin use.      REVIEW OF SYSTEMS:   Review of Systems  Constitutional: Negative for chills, diaphoresis, fever, malaise/fatigue and weight loss.  HENT: Negative for congestion, ear pain, hearing loss, nosebleeds, sinus pain, sore throat and tinnitus.   Eyes: Negative for blurred vision, double vision and photophobia.  Respiratory: Positive for cough and shortness of breath. Negative for hemoptysis, sputum production and wheezing.   Cardiovascular: Positive for chest pain. Negative for palpitations, orthopnea, claudication, leg swelling and PND.  Gastrointestinal: Negative for abdominal pain, blood in stool, constipation, diarrhea, heartburn, melena, nausea and vomiting.  Genitourinary: Negative for dysuria, frequency, hematuria and urgency.  Musculoskeletal: Negative for back pain, joint pain, myalgias and neck pain.  Skin: Negative for itching and rash.  Neurological: Negative for dizziness, tingling, tremors, sensory change, speech change, focal weakness, seizures, loss of consciousness, weakness and headaches.  Psychiatric/Behavioral: Negative for memory loss. The patient does not have insomnia.    MEDICATIONS AT HOME:   Prior to Admission medications   Medication Sig Start Date End Date Taking? Authorizing Provider  albuterol (PROVENTIL HFA;VENTOLIN HFA) 108 (90 Base) MCG/ACT inhaler Inhale 1-2 puffs into the lungs every  4 (four) hours as needed for wheezing or shortness of breath.    [provider]  albuterol (PROVENTIL) (2.5 MG/3ML) 0.083% nebulizer solution Take 2.5 mg by nebulization every 4 (four) hours as needed for wheezing or shortness of breath.    [provider]  aspirin EC 81 MG tablet Take 81 mg by mouth  daily.    [provider]  cephALEXin (KEFLEX) 500 MG capsule Take 1 capsule (500 mg total) by mouth 3 (three) times daily. 03/31/17   Phineas Semen, MD  clopidogrel (PLAVIX) 75 MG tablet Take 75 mg by mouth daily.    [provider]  diclofenac sodium (VOLTAREN) 1 % GEL Apply 2 g topically 4 (four) times daily. 04/29/16   Phineas Semen, MD  DULoxetine (CYMBALTA) 60 MG capsule Take 60 mg by mouth daily.    [provider]  enalapril (VASOTEC) 2.5 MG tablet Take 2.5 mg by mouth 2 (two) times daily.    [provider]  feeding supplement, ENSURE ENLIVE, (ENSURE ENLIVE) LIQD Take 237 mLs by mouth 2 (two) times daily between meals. 03/28/17   Altamese Dilling, MD  Fluticasone-Salmeterol (ADVAIR DISKUS) 100-50 MCG/DOSE AEPB Inhale 1 puff into the lungs 2 (two) times daily. 03/28/17 03/28/18  Altamese Dilling, MD  furosemide (LASIX) 40 MG tablet Take 40 mg by mouth daily.     [provider]  hydrALAZINE (APRESOLINE) 25 MG tablet Take 25 mg by mouth daily.     [provider]  isosorbide mononitrate (IMDUR) 30 MG 24 hr tablet Take 30 mg by mouth daily.    [provider]  latanoprost (XALATAN) 0.005 % ophthalmic solution Place 1 drop into both eyes at bedtime.    [provider]  metoprolol succinate (TOPROL-XL) 25 MG 24 hr tablet Take 25 mg by mouth daily.    [provider]  nitroGLYCERIN (NITROSTAT) 0.3 MG SL tablet Place 0.3 mg under the tongue every 5 (five) minutes as needed for chest pain.    [provider]  oxyCODONE-acetaminophen (PERCOCET/ROXICET) 5-325 MG tablet Take 1 tablet by mouth 2 (two) times daily as needed for severe pain.    [provider]  pravastatin (PRAVACHOL) 20 MG tablet Take 20 mg by mouth at bedtime.    [provider]  predniSONE (STERAPRED UNI-PAK 21 TAB) 10 MG (21) TBPK tablet Take 6 tabs first day, 5 tab on day 2, then 4 on day 3rd, 3 tabs on day 4th , 2 tab  on day 5th, and 1 tab on 6th day. Patient taking differently: Take 10 mg by mouth daily. Take 6 tabs first day, 5 tab on day 2, then 4 on day 3rd, 3 tabs on day 4th , 2 tab on day 5th, and 1 tab on 6th day. 03/28/17   Altamese Dilling, MD  tiotropium (SPIRIVA) 18 MCG inhalation capsule Place 18 mcg into inhaler and inhale daily.    [provider]  Vitamin D, Ergocalciferol, (DRISDOL) 50000 units CAPS capsule Take 50,000 Units by mouth every 30 (thirty) days. Take on the first day of each month.    [provider]      VITAL SIGNS:  Blood pressure (!) 144/79, pulse 69, temperature 97.7 F (36.5 C), temperature source Oral, resp. rate 18, height 6' (1.829 m), weight 97 kg (213 lb 12.8 oz), SpO2 93 %.  PHYSICAL EXAMINATION:  Physical Exam  Constitutional: She is oriented to person, place, and time. She appears well-developed and well-nourished. She is active and cooperative.  Non-toxic appearance. She does not have a sickly appearance. She does not appear ill. No distress. She is not intubated.  HENT:  Head: Normocephalic and atraumatic.  Mouth/Throat: Oropharynx is clear and moist. No oropharyngeal exudate.  Eyes: Conjunctivae, EOM and lids are normal. No scleral icterus.  Neck: Neck supple. No JVD present. No thyromegaly present.  Cardiovascular: Normal rate, regular rhythm, S1 normal, S2 normal and normal heart sounds.  No extrasystoles are present. Exam reveals no gallop, no S3, no S4, no distant heart sounds and no friction rub.  No murmur heard. Pulmonary/Chest: Effort normal and breath sounds normal. No accessory muscle usage or stridor. No apnea, no tachypnea and no bradypnea. She is not intubated. No respiratory distress. She has no decreased breath sounds. She has no wheezes. She has no rhonchi. She has no rales.  Abdominal: Soft. Bowel sounds are normal. She exhibits no distension. There is no tenderness. There is no rebound and no guarding.  Musculoskeletal:  Normal range of motion. She exhibits no edema or tenderness.  Lymphadenopathy:    She has no cervical adenopathy.  Neurological: She is alert and oriented to person, place, and time. She is not disoriented.  Skin: Skin is warm, dry and intact. No rash noted. She is not diaphoretic. No erythema.  Psychiatric: She has a normal mood and affect. Her speech is normal and behavior is normal. Judgment and thought content normal. Cognition and memory are normal.   LABORATORY PANEL:   CBC Recent Labs  Lab 07/05/17 0040  WBC 3.5*  HGB 12.6  HCT 37.8  PLT 180   ------------------------------------------------------------------------------------------------------------------  Chemistries  Recent Labs  Lab 07/05/17 0040  NA 141  K 4.0  CL 105  CO2 29  GLUCOSE 84  BUN 25*  CREATININE 1.51*  CALCIUM 8.9   ------------------------------------------------------------------------------------------------------------------  Cardiac Enzymes Recent Labs  Lab 07/05/17 0040  TROPONINI 0.03*   ------------------------------------------------------------------------------------------------------------------  RADIOLOGY:  Dg Chest Port 1 View  Result Date: 07/05/2017 CLINICAL DATA:  Chest pain EXAM: PORTABLE CHEST 1 VIEW COMPARISON:  03/26/2017 FINDINGS: Remote median sternotomy for CABG. Moderate cardiomegaly is unchanged. No pulmonary edema or focal airspace consolidation. No pleural effusion or pneumothorax. IMPRESSION: Cardiomegaly without pulmonary edema. Electronically Signed   By: Deatra Robinson M.D.   On: 07/05/2017 01:15   IMPRESSION AND PLAN:   A/P: 8F w/ CAD p/w 1d Hx atypical CP, tobacco use D/O. Also w/ CKD III-IV, leukopenia, macrocytosis w/o anemia. -Trop-I 0.03 x1, 2x rpt pending -EKG (-) acute ischemic changes -Tele, continuous cardiac monitoring -ASA81 -Morphine, NTG PRN -O2 -NPO -Non-ambulating nuclear pharmacological stress test -D/C Enalapril, start Losartan -c/w  Plavix, statin, beta blocker, long-acting nitrate -Encouraged smoking cessation -Cr 1.51, baseline 1.1-1.4, near baseline. CKD III-IV. Avoid nephrotoxins -WBC 3.5. Afebrile, SIRS (-) -MCV 100.8, B12 and folate pending -c/w other home meds/formulary subs -NPO for stress -Lovenox -Full code -Observation, < 2 midnights   All the records are reviewed and case discussed with ED provider. Management plans discussed with the patient, family and they are in agreement.  CODE STATUS: Full code  TOTAL TIME TAKING CARE OF THIS PATIENT: 60 minutes.    Barbaraann Rondo M.D on 07/05/2017 at 3:21 AM  Between 7am to 6pm - Pager - [phone]  After 6pm go to [email or web address] - Social research officer, government  [facility] [place] [facility]  Office  [phone]  CC: Primary care physician; System, Pcp Not In   Note: This dictation was prepared with Dragon dictation along with smaller phrase technology. Any  transcriptional errors that result from this process are unintentional.

## 2017-07-05 NOTE — Progress Notes (Signed)
Pt off the floor for stress test.

## 2017-07-05 NOTE — Progress Notes (Signed)
Abnormal stress test  Abnormal, potentially high risk pharmacologic myocardial perfusion stress test.  There is a large in size, severe, fixed inferior/inferoseptal defect consistent with scar.  There is a small in size, severe, fixed defect involving the basal anterolateral and inferolateral segments most consistent with scar but cannot rule out artifact.  No significant ischemia is identified.  The left ventricular ejection fraction is moderately to severely decreased (32%) with inferior akinesis and basal anterolateral hypokinesis. The left ventricle is dilated.  Compared with prior study from 09/09/14, there has been no significant interval change.  Likely chronic changes from scars.  Atypical symptoms. Wheezing and coughing.  Will start solumedrol and scheduled nebs.  Discussed with Dr. Lady GaryFath. He will see patient. Consult added.

## 2017-07-05 NOTE — Care Management Obs Status (Signed)
MEDICARE OBSERVATION STATUS NOTIFICATION   Patient Details  Name: Cynthia [name] MRN: [id number] Date of Birth: 11/02/1940   Medicare Observation Status Notification Given:  Yes    Eber HongGreene, Athziri Freundlich R, RN 07/05/2017, 4:07 PM

## 2017-07-05 NOTE — Progress Notes (Signed)
Patient arrived to 2A Room 258. Patient denies pain and all questions answered. Patient oriented to unit and use of call bell/room phone. Skin assessment completed with Hansel StarlingAdrienne RN and skin intact. A&Ox4, VSS, and NSR on verified tele-box #[id number]. Nursing staff will continue to monitor for any changes in patient status. Lamonte RicherKara A Braidan Ricciardi, RN

## 2017-07-05 NOTE — Consult Note (Signed)
Cardiology Consultation Note    Patient ID: Cynthia [name], MRN: [id number], DOB/AGE: September 28, 1940 77 y.o. Admit date: 07/05/2017   Date of Consult: 07/05/2017 Primary Physician: System, Pcp Not In Primary Cardiologist: Dr. Juliann Pares  Chief Complaint: chest pain Reason for Consultation: chest pain. Abnormal stress test Requesting MD: Dr. Elpidio Anis  HPI: Cynthia [name] is a 77 y.o. female with history of coronary artery disease status post coronary artery bypass grafting in t [id number].  She had an ejection fraction of 27% at that time.  She had a saphenous vein graft to the PDA OM and a free left internal mammary graft to the LAD.  She was last seen as an outpatient in 2016 after recent hospitalization.  Functional study at that time showed no reversible ischemia with fixed defect.  She was continued with medical management.  She had a functional study in 2016 showing large fixed inferobasilar inferoseptal defect with no reversible ischemia.  EF was 30 to 44%.  She has had a cath post CABG which showed diffuse disease with occluded saphenous vein grafts.  Medical management was recommended.  She was admitted today after awakening from sleep with sharp and dull chest pain right-sided in location.  It was not exertional.  It was also associated with a cough and wheezing.  He ruled out for myocardial infarction.  Her symptoms she was scheduled for a Lexiscan sestamibi study today which was read as showing large inferoseptal inferolateral basilar fixed defect with no reversibility with an EF of 32%.  Comparison to study from 2016 showed no appreciable change.  She has been treated as an outpatient with dual antiplatelet therapy with aspirin and Plavix, isosorbide, hydralazine, metoprolol.  She is also on losartan.  Due to wheezing she is been given Solu-Medrol.  EKG revealed sinus arrhythmia with nonspecific ST-T wave changes.  Chest x-ray revealed cardiomegaly with no pulmonary edema.  Patient has acute on  chronic renal insufficiency with serum creatinine 1.51.  Baseline appears to be 1.1-1.2. Past Medical History:  Diagnosis Date  . Blood clot in vein   . Coronary artery disease   . GERD (gastroesophageal reflux disease)   . Heart attack (HCC)   . Hx of CABG 2013  . Hyperlipidemia   . Hypertension       Surgical History:  Past Surgical History:  Procedure Laterality Date  . CARDIAC SURGERY    . HIP SURGERY Left 12/2014  . SHOULDER SURGERY Bilateral      Home Meds: Prior to Admission medications   Medication Sig Start Date End Date Taking? Authorizing Provider  acetaminophen (TYLENOL) 650 MG CR tablet Take 650 mg by mouth every 12 (twelve) hours as needed for pain.   Yes [provider]  albuterol (PROVENTIL) (2.5 MG/3ML) 0.083% nebulizer solution Take 2.5 mg by nebulization every 4 (four) hours as needed for wheezing or shortness of breath.   Yes [provider]  aspirin EC 81 MG tablet Take 81 mg by mouth daily.   Yes [provider]  DULoxetine (CYMBALTA) 60 MG capsule Take 60 mg by mouth daily.   Yes [provider]  furosemide (LASIX) 40 MG tablet Take 40 mg by mouth every Monday, Wednesday, and Friday.    Yes [provider]  gabapentin (NEURONTIN) 300 MG capsule Take 300 mg by mouth at bedtime.   Yes [provider]  HYDROcodone-acetaminophen (NORCO/VICODIN) 5-325 MG tablet Take 1 tablet by mouth 2 (two) times daily as needed for moderate pain.  Yes [provider]  hydrocortisone cream 0.5 % Apply 1 application topically 2 (two) times daily as needed for itching.   Yes [provider]  ipratropium-albuterol (DUONEB) 0.5-2.5 (3) MG/3ML SOLN Take 3 mLs by nebulization every 6 (six) hours.    Yes [provider]  isosorbide mononitrate (IMDUR) 30 MG 24 hr tablet Take 30 mg by mouth daily.   Yes [provider]  latanoprost (XALATAN) 0.005 % ophthalmic solution Place 1 drop into both eyes at  bedtime.   Yes [provider]  naloxone (NARCAN) nasal spray 4 mg/0.1 mL Place 1 spray into the nose once. Repeat every 3 minutes if needed for unresponsiveness   Yes [provider]  nitroGLYCERIN (NITROSTAT) 0.3 MG SL tablet Place 0.3 mg under the tongue every 5 (five) minutes as needed for chest pain.   Yes [provider]  Polyethyl Glycol-Propyl Glycol (SYSTANE) 0.4-0.3 % GEL ophthalmic gel Place 1 application into both eyes at bedtime.   Yes [provider]  QUEtiapine (SEROQUEL) 25 MG tablet Take 25 mg by mouth at bedtime.   Yes [provider]  salmeterol (SEREVENT) 50 MCG/DOSE diskus inhaler Inhale 1 puff into the lungs 2 (two) times daily.   Yes [provider]  sennosides-docusate sodium (SENOKOT-S) 8.6-50 MG tablet Take 1 tablet by mouth daily.   Yes [provider]  Zinc Oxide (DESITIN) 13 % CREA Place 1 application into the perineum 4 (four) times daily as needed (skin protectant).   Yes [provider]  cephALEXin (KEFLEX) 500 MG capsule Take 1 capsule (500 mg total) by mouth 3 (three) times daily. Patient not taking: Reported on 07/05/2017 03/31/17   Phineas Semen, MD  feeding supplement, ENSURE ENLIVE, (ENSURE ENLIVE) LIQD Take 237 mLs by mouth 2 (two) times daily between meals. Patient not taking: Reported on 07/05/2017 03/28/17   Altamese Dilling, MD  Fluticasone-Salmeterol (ADVAIR DISKUS) 100-50 MCG/DOSE AEPB Inhale 1 puff into the lungs 2 (two) times daily. Patient not taking: Reported on 07/05/2017 03/28/17 03/28/18  Altamese Dilling, MD  predniSONE (STERAPRED UNI-PAK 21 TAB) 10 MG (21) TBPK tablet Take 6 tabs first day, 5 tab on day 2, then 4 on day 3rd, 3 tabs on day 4th , 2 tab on day 5th, and 1 tab on 6th day. Patient not taking: Reported on 07/05/2017 03/28/17   Altamese Dilling, MD    Inpatient Medications:  . aspirin EC  81 mg Oral Daily  . clopidogrel  75 mg Oral Daily  . DULoxetine  60  mg Oral Daily  . enoxaparin (LOVENOX) injection  40 mg Subcutaneous Q24H  . feeding supplement (ENSURE ENLIVE)  237 mL Oral BID BM  . furosemide  40 mg Oral Daily  . hydrALAZINE  25 mg Oral Daily  . ipratropium-albuterol  6 mL Nebulization Q6H  . isosorbide mononitrate  30 mg Oral Daily  . latanoprost  1 drop Both Eyes QHS  . losartan  25 mg Oral Daily  . methylPREDNISolone (SOLU-MEDROL) injection  60 mg Intravenous BID  . metoprolol succinate  25 mg Oral Daily  . mometasone-formoterol  2 puff Inhalation BID  . pravastatin  20 mg Oral QHS  . tiotropium  18 mcg Inhalation Daily     Allergies:  Allergies  Allergen Reactions  . Penicillins Swelling and Other (See Comments)    Has patient had a PCN reaction causing immediate rash, facial/tongue/throat swelling, SOB or lightheadedness with hypotension: No Has patient had a PCN reaction causing severe rash  involving mucus membranes or skin necrosis: No Has patient had a PCN reaction that required hospitalization: No Has patient had a PCN reaction occurring within the last 10 years: No If all of the above answers are "NO", then may proceed with Cephalosporin use.      Social History   Socioeconomic History  . Marital status: Married    Spouse name: Not on file  . Number of children: Not on file  . Years of education: Not on file  . Highest education level: Not on file  Occupational History  . Not on file  Social Needs  . Financial resource strain: Not on file  . Food insecurity:    Worry: Not on file    Inability: Not on file  . Transportation needs:    Medical: Not on file    Non-medical: Not on file  Tobacco Use  . Smoking status: Current Every Day Smoker    Packs/day: 0.10    Years: 35.00    Pack years: 3.50    Types: Cigarettes  . Smokeless tobacco: Never Used  Substance and Sexual Activity  . Alcohol use: No  . Drug use: No  . Sexual activity: Not on file  Lifestyle  . Physical activity:    Days per week: Not  on file    Minutes per session: Not on file  . Stress: Not on file  Relationships  . Social connections:    Talks on phone: Not on file    Gets together: Not on file    Attends religious service: Not on file    Active member of club or organization: Not on file    Attends meetings of clubs or organizations: Not on file    Relationship status: Not on file  . Intimate partner violence:    Fear of current or ex partner: Not on file    Emotionally abused: Not on file    Physically abused: Not on file    Forced sexual activity: Not on file  Other Topics Concern  . Not on file  Social History Narrative  . Not on file     Family History  Problem Relation Age of Onset  . Hypertension Other   . Alcohol abuse Other      Review of Systems: A 12-system review of systems was performed and is negative except as noted in the HPI.  Labs: Recent Labs    07/05/17 0040 07/05/17 0344 07/05/17 0657  TROPONINI 0.03* 0.03* 0.03*   Lab Results  Component Value Date   WBC 3.5 (L) 07/05/2017   HGB 12.6 07/05/2017   HCT 37.8 07/05/2017   MCV 100.8 (H) 07/05/2017   PLT 180 07/05/2017    Recent Labs  Lab 07/05/17 0040  NA 141  K 4.0  CL 105  CO2 29  BUN 25*  CREATININE 1.51*  CALCIUM 8.9  GLUCOSE 84   Lab Results  Component Value Date   CHOL 162 10/19/2012   HDL 44 10/19/2012   LDLCALC 107 (H) 10/19/2012   TRIG 57 10/19/2012   No results found for: DDIMER  Radiology/Studies:  Nm Myocar Multi W/spect W/wall Motion / Ef  Result Date: 07/05/2017  Abnormal, potentially high risk pharmacologic myocardial perfusion stress test.  There is a large in size, severe, fixed inferior/inferoseptal defect consistent with scar.  There is a small in size, severe, fixed defect involving the basal anterolateral and inferolateral segments most consistent with scar but cannot rule out artifact.  No significant ischemia is  identified.  The left ventricular ejection fraction is moderately to  severely decreased (32%) with inferior akinesis and basal anterolateral hypokinesis. The left ventricle is dilated.  Compared with prior study from 09/09/14, there has been no significant interval change.    Dg Chest Port 1 View  Result Date: 07/05/2017 CLINICAL DATA:  Chest pain EXAM: PORTABLE CHEST 1 VIEW COMPARISON:  03/26/2017 FINDINGS: Remote median sternotomy for CABG. Moderate cardiomegaly is unchanged. No pulmonary edema or focal airspace consolidation. No pleural effusion or pneumothorax. IMPRESSION: Cardiomegaly without pulmonary edema. Electronically Signed   By: Deatra Robinson M.D.   On: 07/05/2017 01:15    Wt Readings from Last 3 Encounters:  07/05/17 97 kg (213 lb 12.8 oz)  03/31/17 78 kg (172 lb)  03/26/17 78 kg (172 lb)    EKG: Sinus rhythm with nonspecific ST-T wave changes.  Physical Exam:  Blood pressure (!) 156/90, pulse 70, temperature 98.1 F (36.7 C), temperature source Oral, resp. rate 18, height 6' (1.829 m), weight 97 kg (213 lb 12.8 oz), SpO2 98 %. Body mass index is 29 kg/m. General: Well developed, well nourished, in no acute distress. Head: Normocephalic, atraumatic, sclera non-icteric, no xanthomas, nares are without discharge.  Neck: Negative for carotid bruits. JVD not elevated. Lungs: Wheezes bilaterally. Heart: RRR with S1 S2. No murmurs, rubs, or gallops appreciated. Abdomen: Soft, non-tender, non-distended with normoactive bowel sounds. No hepatomegaly. No rebound/guarding. No obvious abdominal masses. Msk:  Strength and tone appear normal for age. Extremities: No clubbing or cyanosis.  1-2+ lower extremity edema.  Distal pedal pulses are 2+ and equal bilaterally. Neuro: Alert and oriented X 3. No facial asymmetry. No focal deficit. Moves all extremities spontaneously. Psych:  Responds to questions appropriately with a normal affect.     Assessment and Plan  77 year old female with history of coronary disease status post coronary bypass grafting  with with a free LIMA to the LAD as well as saphenous vein graft to the OM and PDA.  Vein grafts were occluded by cath after CABG.  She has been treated medically due to diffuse disease with this.  She had a functional study in 2016 showing a large area of inferoapical inferobasilar fixed defect with no significant reversibility.  Functional study done today showed similar findings.  Patient is currently wheezing.  Based on coronary anatomy from previous catheter as well as functional study in 2016 as well as the one done today suggests no appreciable change.  Will attempt to treat her broncho-constriction with bronchodilators and follow.  Should symptoms worsen, consideration for relook cardiac catheterization could be raised however given lack of reversible ischemia as well as known diffuse disease with occluded vein graft to the inferior and inferobasilar area, likely medical management is warranted.  We will follow with you.  Signed, Dalia Heading MD 07/05/2017, 2:57 PM Pager: [phone]

## 2017-07-05 NOTE — Progress Notes (Signed)
Pt back from stress test, vss.

## 2017-07-06 MED ORDER — NITROGLYCERIN 2 % TD OINT
0.5000 [in_us] | TOPICAL_OINTMENT | Freq: Four times a day (QID) | TRANSDERMAL | Status: DC
  Administered 2017-07-06: 0.5 [in_us] via TOPICAL
  Filled 2017-07-06: qty 1

## 2017-07-06 MED ORDER — TIOTROPIUM BROMIDE MONOHYDRATE 18 MCG IN CAPS: 18.0000 ug | ORAL_CAPSULE | Freq: Every day | RESPIRATORY_TRACT | 12 refills | Status: DC

## 2017-07-06 MED ORDER — NITROGLYCERIN 2 % TD OINT
TOPICAL_OINTMENT | TRANSDERMAL | Status: AC
  Filled 2017-07-06: qty 1

## 2017-07-06 MED ORDER — HYDRALAZINE HCL 20 MG/ML IJ SOLN: 5.0000 mg | Freq: Four times a day (QID) | INTRAMUSCULAR | Status: DC | PRN

## 2017-07-06 NOTE — Discharge Instructions (Signed)
Coronary Artery Disease, Female °Coronary artery disease (CAD) is a condition in which the arteries that lead to the heart (coronary arteries) become narrow or blocked. The narrowing or blockage can lead to decreased blood flow to the heart. Prolonged reduced blood flow can cause a heart attack (myocardial infarction or MI). This condition may also be called coronary heart disease. °Because CAD is the leading cause of death in women, it is important to understand what causes this condition and how it is treated. °What are the causes? °CAD is most often caused by atherosclerosis. This is the buildup of fat and cholesterol (plaque) on the inside of the arteries. Over time, the plaque may narrow or block the artery, reducing blood flow to the heart. Plaque can also become weak and break off within a coronary artery and cause a sudden blockage. Other less common causes of CAD include: °· An embolism or blood clot in a coronary artery. °· A tearing of the artery (spontaneous coronary artery dissection). °· An aneurysm. °· Inflammation (vasculitis) in the artery wall. ° °What increases the risk? °The following factors may make you more likely to develop this condition: °· Age. Women over age 76 are at a greater risk of CAD. °· Family history of CAD. °· High blood pressure (hypertension). °· Diabetes. °· High cholesterol levels. °· Tobacco use. °· Lack of exercise. °· Menopause. °? All postmenopausal women are at greater risk of CAD. °? Women who have experienced menopause between the ages of 40-45 (early menopause) are at a higher risk of CAD. °? Women who have experienced menopause before age 40 (premature menopause) are at a very high risk of CAD. °· Excessive alcohol use °· A diet high in saturated and trans fats, such as fried food and processed meat. ° °Other possible risk factors include: °· High stress levels. °· Depression °· Obesity. °· Sleep apnea. ° °What are the signs or symptoms? °Many people do not have any  symptoms during the early stages of CAD. As the condition progresses, symptoms may include: °· Chest pain (angina). The pain can: °? Feel like crushing or squeezing, or a tightness, pressure, fullness, or heaviness in the chest. °? Last more than a few minutes or can stop and recur. The pain tends to get worse with exercise or stress and to fade with rest. °· Pain in the arms, neck, jaw, or back. °· Unexplained heartburn or indigestion. °· Shortness of breath. °· Nausea. °· Sudden cold sweats. °· Sudden light-headedness. °· Fluttering or fast heartbeat (palpitations). ° °Many women have chest discomfort and the other symptoms. However, women often have unusual (atypical) symptoms, such as: °· Fatigue. °· Vomiting. °· Unexplained feelings of nervousness or anxiety. °· Unexplained weakness. °· Dizziness or fainting. ° °How is this diagnosed? °This condition is diagnosed based on: °· Your family and medical history. °· A physical exam. °· Tests, including: °? A test to check the electrical signals in your heart (electrocardiogram). °? Exercise stress test. This looks for signs of blockage when the heart is stressed with exercise, such as running on a treadmill. °? Pharmacologic stress test. This test looks for signs of blockage when the heart is being stressed with a medicine. °? Blood tests. °? Coronary angiogram. This is a procedure to look at the coronary arteries to see if there is any blockage. During this test, a dye is injected into your arteries so they appear on an X-ray. °? A test that uses sound waves to take a picture of   your heart (echocardiogram). °? Chest X-ray. ° °How is this treated? °This condition may be treated by: °· Healthy lifestyle changes to reduce risk factors. °· Medicines such as: °? Antiplatelet medicines and blood-thinning medicines, such as aspirin. These help prevent blood clots. °? Nitroglycerin. °? Blood pressure medicines. °? Cholesterol-lowering medicine. °· Coronary angioplasty and  stenting. During this procedure, a thin, flexible tube is inserted through a blood vessel and into a blocked artery. A balloon or similar device on the end of the tube is inflated to open up the artery. In some cases, a small, mesh tube (stent) is inserted into the artery to keep it open. °· Coronary artery bypass surgery. During this surgery, veins or arteries from other parts of the body are used to create a bypass around the blockage and allow blood to reach your heart. ° °Follow these instructions at home: °Medicines °· Take over-the-counter and prescription medicines only as told by your health care provider. °· Do not take the following medicines unless your health care provider approves: °? NSAIDs, such as ibuprofen, naproxen, or celecoxib. °? Vitamin supplements that contain vitamin A, vitamin E, or both. °? Hormone replacement therapy that contains estrogen with or without progestin. °Lifestyle °· Follow an exercise program approved by your health care provider. Aim for 150 minutes of moderate exercise or 75 minutes of vigorous exercise each week. °· Maintain a healthy weight or lose weight as approved by your health care provider. °· Rest when you are tired. °· Learn to manage stress or try to limit your stress. Ask your health care provider for suggestions if you need help. °· Get screened for depression and seek treatment, if needed. °· Do not use any products that contain nicotine or tobacco, such as cigarettes and e-cigarettes. If you need help quitting, ask your health care provider. °· Do not use illegal drugs. °Eating and drinking °· Follow a heart-healthy diet. A dietitian can help educate you about healthy food options and changes. In general, eat plenty of fruits and vegetables, lean meats, and whole grains. °· Avoid foods high in: °? Sugar. °? Salt (sodium). °? Saturated fats, such as processed or fatty meat. °? Trans fats, such as fried food. °· Use healthy cooking methods such as roasting,  grilling, broiling, baking, poaching, steaming, or stir-frying. °· If you drink alcohol, and your health care provider approves, limit your alcohol intake to no more than 1 drink per day. One drink equals 12 ounces of beer, 5 ounces of wine, or 1½ ounces of hard liquor. °General instructions °· Manage any other health conditions, such as hypertension and diabetes. These conditions affect your heart. °· Your health care provider may ask you to monitor your blood pressure. Ideally, your blood pressure should be below 130/80. °· Keep all follow-up visits as told by your health care provider. This is important. °Get help right away if: °· You have pain in your chest, neck, arm, jaw, stomach, or back that: °? Lasts more than a few minutes. °? Is recurring. °? Is not relieved by taking medicine under your tongue (sublingualnitroglycerin). °· You have profuse sweating without cause. °· You have unexplained: °? Heartburn or indigestion. °? Shortness of breath or difficulty breathing. °? Fluttering or fast heartbeat (palpitations). °? Nausea or vomiting. °? Fatigue. °? Feelings of nervousness or anxiety. °? Weakness. °? Diarrhea. °· You have sudden light-headedness or dizziness. °· You faint. °· You feel like hurting yourself or think about taking your own life. °These symptoms may represent   a serious problem that is an emergency. Do not wait to see if the symptoms will go away. Get medical help right away. Call your local emergency services (911 in the [place] Do not drive yourself to the hospital. °Summary °· Coronary artery disease (CAD) is a process in which the arteries that lead to the heart (coronary arteries) become narrow or blocked. The narrowing or blockage can lead to a heart attack. °· Many women have chest discomfort and other common symptoms of CAD. However, women often have different (atypical) symptoms, such as fatigue, vomiting, and dizziness or weakness. °· CAD can be treated with lifestyle changes,  medicines, surgery, or a combination of these treatments. °This information is not intended to replace advice given to you by your health care provider. Make sure you discuss any questions you have with your health care provider. °Document Released: 04/04/2011 Document Revised: 01/01/2016 Document Reviewed: 01/01/2016 °[employer] Interactive Patient Education © 2018 [employer] ° °

## 2017-07-06 NOTE — ED Provider Notes (Signed)
[facility] Emergency Department Provider Note    First MD Initiated Contact with Patient 07/05/17 0207     (approximate)  I have reviewed the triage vital signs and the nursing notes.   HISTORY  Chief Complaint No chief complaint on file.    HPI Cynthia [name] is a 77 y.o. female with below list of chronic medical conditions including CAD, MI status post CABG presents to the emergency department with acute onset of sharp central chest pain that awoke the patient from sleep at approximately 10:30 PM tonight.  Patient denies any aggravating factors.  Patient admits to 3 to 4-week history of nonproductive cough and dyspnea.  Patient denies any fever.  Patient denies any lower external knee pain or swelling.  Patient admits to taking 3 sublingual nitroglycerin at home without improvement.  Patient was given 3 and 24 mg of aspirin in route from EMS as well as 1 DuoNeb   Past Medical History:  Diagnosis Date  . Blood clot in vein   . Coronary artery disease   . GERD (gastroesophageal reflux disease)   . Heart attack (HCC)   . Hx of CABG 2013  . Hyperlipidemia   . Hypertension     Patient Active Problem List   Diagnosis Date Noted  . COPD exacerbation (HCC) 03/26/2017  . Pelvic fracture (HCC) 04/30/2016  . HTN (hypertension) 04/30/2016  . HLD (hyperlipidemia) 04/30/2016  . GERD (gastroesophageal reflux disease) 04/30/2016  . CAD (coronary artery disease) 04/30/2016  . Chest pain 09/08/2014  . Dehydration 09/08/2014    Past Surgical History:  Procedure Laterality Date  . CARDIAC SURGERY    . HIP SURGERY Left 12/2014  . SHOULDER SURGERY Bilateral     Prior to Admission medications   Medication Sig Start Date End Date Taking? Authorizing Provider  acetaminophen (TYLENOL) 650 MG CR tablet Take 650 mg by mouth every 12 (twelve) hours as needed for pain.   Yes [provider]  albuterol (PROVENTIL) (2.5 MG/3ML) 0.083% nebulizer solution  Take 2.5 mg by nebulization every 4 (four) hours as needed for wheezing or shortness of breath.   Yes [provider]  aspirin EC 81 MG tablet Take 81 mg by mouth daily.   Yes [provider]  DULoxetine (CYMBALTA) 60 MG capsule Take 60 mg by mouth daily.   Yes [provider]  furosemide (LASIX) 40 MG tablet Take 40 mg by mouth every Monday, Wednesday, and Friday.    Yes [provider]  gabapentin (NEURONTIN) 300 MG capsule Take 300 mg by mouth at bedtime.   Yes [provider]  HYDROcodone-acetaminophen (NORCO/VICODIN) 5-325 MG tablet Take 1 tablet by mouth 2 (two) times daily as needed for moderate pain.   Yes [provider]  hydrocortisone cream 0.5 % Apply 1 application topically 2 (two) times daily as needed for itching.   Yes [provider]  ipratropium-albuterol (DUONEB) 0.5-2.5 (3) MG/3ML SOLN Take 3 mLs by nebulization every 6 (six) hours.    Yes [provider]  isosorbide mononitrate (IMDUR) 30 MG 24 hr tablet Take 30 mg by mouth daily.   Yes [provider]  latanoprost (XALATAN) 0.005 % ophthalmic solution Place 1 drop into both eyes at bedtime.   Yes [provider]  naloxone (NARCAN) nasal spray 4 mg/0.1 mL Place 1 spray into the nose once. Repeat every 3 minutes if needed for unresponsiveness   Yes [provider]  nitroGLYCERIN (NITROSTAT) 0.3 MG SL tablet Place  0.3 mg under the tongue every 5 (five) minutes as needed for chest pain.   Yes [provider]  Polyethyl Glycol-Propyl Glycol (SYSTANE) 0.4-0.3 % GEL ophthalmic gel Place 1 application into both eyes at bedtime.   Yes [provider]  QUEtiapine (SEROQUEL) 25 MG tablet Take 25 mg by mouth at bedtime.   Yes [provider]  salmeterol (SEREVENT) 50 MCG/DOSE diskus inhaler Inhale 1 puff into the lungs 2 (two) times daily.   Yes [provider]  sennosides-docusate sodium (SENOKOT-S) 8.6-50  MG tablet Take 1 tablet by mouth daily.   Yes [provider]  Zinc Oxide (DESITIN) 13 % CREA Place 1 application into the perineum 4 (four) times daily as needed (skin protectant).   Yes [provider]  cephALEXin (KEFLEX) 500 MG capsule Take 1 capsule (500 mg total) by mouth 3 (three) times daily. Patient not taking: Reported on 07/05/2017 03/31/17   Phineas Semen, MD  feeding supplement, ENSURE ENLIVE, (ENSURE ENLIVE) LIQD Take 237 mLs by mouth 2 (two) times daily between meals. Patient not taking: Reported on 07/05/2017 03/28/17   Altamese Dilling, MD  Fluticasone-Salmeterol (ADVAIR DISKUS) 100-50 MCG/DOSE AEPB Inhale 1 puff into the lungs 2 (two) times daily. Patient not taking: Reported on 07/05/2017 03/28/17 03/28/18  Altamese Dilling, MD  predniSONE (STERAPRED UNI-PAK 21 TAB) 10 MG (21) TBPK tablet Take 6 tabs first day, 5 tab on day 2, then 4 on day 3rd, 3 tabs on day 4th , 2 tab on day 5th, and 1 tab on 6th day. Patient not taking: Reported on 07/05/2017 03/28/17   Altamese Dilling, MD    Allergies Penicillins  Family History  Problem Relation Age of Onset  . Hypertension Other   . Alcohol abuse Other     Social History Social History   Tobacco Use  . Smoking status: Current Every Day Smoker    Packs/day: 0.10    Years: 35.00    Pack years: 3.50    Types: Cigarettes  . Smokeless tobacco: Never Used  Substance Use Topics  . Alcohol use: No  . Drug use: No    Review of Systems Constitutional: No fever/chills Eyes: No visual changes. ENT: No sore throat. Cardiovascular: Positive chest pain. Respiratory: Positive shortness of breath. Gastrointestinal: No abdominal pain.  No nausea, no vomiting.  No diarrhea.  No constipation. Genitourinary: Negative for dysuria. Musculoskeletal: Negative for neck pain.  Negative for back pain. Integumentary: Negative for rash. Neurological: Negative for headaches, focal weakness or  numbness.  ____________________________________________   PHYSICAL EXAM:  VITAL SIGNS: ED Triage Vitals  Enc Vitals Group     BP 07/05/17 0230 127/65     Pulse Rate 07/05/17 0036 65     Resp 07/05/17 0036 (!) 22     Temp 07/05/17 0036 98.6 F (37 C)     Temp Source 07/05/17 0036 Oral     SpO2 07/05/17 0036 (!) 89 %     Weight 07/05/17 0042 78 kg (172 lb)     Height 07/05/17 0042 1.829 m (6')     Head Circumference --      Peak Flow --      Pain Score 07/05/17 0041 8     Pain Loc --      Pain Edu? --      Excl. in GC? --     Constitutional: Alert and oriented. Well appearing and in no acute distress. Eyes: Conjunctivae are normal. Head: Atraumatic. Mouth/Throat: Mucous membranes are moist.  Oropharynx non-erythematous. Neck: No stridor.   Cardiovascular: Normal rate, regular rhythm. Good peripheral circulation. Grossly normal heart sounds. Respiratory: Normal respiratory effort.  No retractions.  Expiratory wheezing Gastrointestinal: Soft and nontender. No distention.  Musculoskeletal: No lower extremity tenderness nor edema. No gross deformities of extremities. Neurologic:  Normal speech and language. No gross focal neurologic deficits are appreciated.  Skin:  Skin is warm, dry and intact. No rash noted. Psychiatric: Mood and affect are normal. Speech and behavior are normal.  ____________________________________________   LABS (all labs ordered are listed, but only abnormal results are displayed)  Labs Reviewed  BASIC METABOLIC PANEL - Abnormal; Notable for the following components:      Result Value   BUN 25 (*)    Creatinine, Ser 1.51 (*)    GFR calc non Af Amer 32 (*)    GFR calc Af Amer 38 (*)    All other components within normal limits  CBC - Abnormal; Notable for the following components:   WBC 3.5 (*)    RBC 3.75 (*)    MCV 100.8 (*)    All other components within normal limits  TROPONIN I - Abnormal; Notable for the following components:   Troponin  I 0.03 (*)    All other components within normal limits  TROPONIN I - Abnormal; Notable for the following components:   Troponin I 0.03 (*)    All other components within normal limits  TROPONIN I - Abnormal; Notable for the following components:   Troponin I 0.03 (*)    All other components within normal limits  VITAMIN B12  FOLATE   ____________________________________________  EKG  ED ECG REPORT I, [place] N BROWN, the attending physician, personally viewed and interpreted this ECG.   Date: 07/06/2017  EKG Time: 12:38 AM  Rate: 63  Rhythm: Normal sinus rhythm  Axis: Normal  Intervals: Normal  ST&T Change: None  ____________________________________________  RADIOLOGY I, [place] N BROWN, personally viewed and evaluated these images (plain radiographs) as part of my medical decision making, as well as reviewing the written report by the radiologist.  ED MD interpretation: Cardiomegaly without pulmonary edema on chest x-ray per radiologist.  Official radiology report(s): Nm Myocar Multi W/spect W/wall Motion / Ef  Result Date: 07/05/2017  Abnormal, potentially high risk pharmacologic myocardial perfusion stress test.  There is a large in size, severe, fixed inferior/inferoseptal defect consistent with scar.  There is a small in size, severe, fixed defect involving the basal anterolateral and inferolateral segments most consistent with scar but cannot rule out artifact.  No significant ischemia is identified.  The left ventricular ejection fraction is moderately to severely decreased (32%) with inferior akinesis and basal anterolateral hypokinesis. The left ventricle is dilated.  Compared with prior study from 09/09/14, there has been no significant interval change.       Procedures   ____________________________________________   INITIAL IMPRESSION / ASSESSMENT AND PLAN / ED COURSE  As part of my medical decision making, I reviewed the following data within the  electronic MEDICAL RECORD NUMBER   77 year old female presented with above-stated history and physical exam secondary to chest pain and dyspnea.  Concern for potential cardiac etiology CAD/MI and as such EKG was performed which revealed no evidence of ischemia or infarction.  Laboratory data notable for troponin of 0.03.  Patient received additional DuoNeb treatments in the emergency department with improvement of dyspnea.  Patient discussed with Dr. Marjie Skiff hospital admission for further evaluation management ____________________________________________  FINAL CLINICAL IMPRESSION(S) /  ED DIAGNOSES  Final diagnoses:  Chest pain, unspecified type     MEDICATIONS GIVEN DURING THIS VISIT:  Medications  acetaminophen (TYLENOL) tablet 650 mg (has no administration in time range)  ondansetron (ZOFRAN) injection 4 mg (has no administration in time range)  enoxaparin (LOVENOX) injection 40 mg (40 mg Subcutaneous Given 07/05/17 2136)  aspirin EC tablet 81 mg (81 mg Oral Given 07/05/17 1157)  nitroGLYCERIN (NITROSTAT) SL tablet 0.4 mg (has no administration in time range)  morphine 2 MG/ML injection 2 mg ( Intravenous See Alternative 07/05/17 2336)    Or  morphine 4 MG/ML injection 4 mg (4 mg Intravenous Given 07/05/17 2336)  senna-docusate (Senokot-S) tablet 1 tablet (has no administration in time range)  bisacodyl (DULCOLAX) EC tablet 5 mg (has no administration in time range)  albuterol (PROVENTIL) (2.5 MG/3ML) 0.083% nebulizer solution 2.5 mg (2.5 mg Nebulization Given 07/05/17 1158)  clopidogrel (PLAVIX) tablet 75 mg (75 mg Oral Given 07/05/17 1157)  DULoxetine (CYMBALTA) DR capsule 60 mg (60 mg Oral Given 07/05/17 1158)  feeding supplement (ENSURE ENLIVE) (ENSURE ENLIVE) liquid 237 mL (237 mLs Oral Not Given 07/05/17 1254)  mometasone-formoterol (DULERA) 100-5 MCG/ACT inhaler 2 puff (2 puffs Inhalation Given 07/05/17 2135)  furosemide (LASIX) tablet 40 mg (40 mg Oral Given 07/05/17 1157)  hydrALAZINE  (APRESOLINE) tablet 25 mg (25 mg Oral Given 07/05/17 1157)  isosorbide mononitrate (IMDUR) 24 hr tablet 30 mg (30 mg Oral Given 07/05/17 1157)  latanoprost (XALATAN) 0.005 % ophthalmic solution 1 drop (1 drop Both Eyes Given 07/05/17 2135)  metoprolol succinate (TOPROL-XL) 24 hr tablet 25 mg (25 mg Oral Given 07/05/17 1158)  pravastatin (PRAVACHOL) tablet 20 mg (20 mg Oral Given 07/05/17 2134)  tiotropium (SPIRIVA) inhalation capsule 18 mcg (18 mcg Inhalation Given 07/05/17 1205)  losartan (COZAAR) tablet 25 mg (25 mg Oral Given 07/05/17 1158)  methylPREDNISolone sodium succinate (SOLU-MEDROL) 125 mg/2 mL injection 60 mg (60 mg Intravenous Given 07/05/17 2134)  ipratropium-albuterol (DUONEB) 0.5-2.5 (3) MG/3ML nebulizer solution 6 mL (6 mLs Nebulization Given 07/06/17 0303)  nitroGLYCERIN (NITROGLYN) 2 % ointment 0.5 inch (0.5 inches Topical Given 07/06/17 0334)  nitroGLYCERIN (NITROGLYN) 2 % ointment (  Not Given 07/06/17 0348)  hydrALAZINE (APRESOLINE) injection 5 mg (has no administration in time range)  ipratropium-albuterol (DUONEB) 0.5-2.5 (3) MG/3ML nebulizer solution 6 mL ( Nebulization Not Given 07/05/17 0304)  technetium tetrofosmin (TC-MYOVIEW) injection 14.435 millicurie (14.435 millicuries Intravenous Contrast Given 07/05/17 0905)  technetium tetrofosmin (TC-MYOVIEW) injection 32.251 millicurie (32.251 millicuries Intravenous Contrast Given 07/05/17 1030)  regadenoson (LEXISCAN) injection SOLN 0.4 mg (0.4 mg Intravenous Given 07/05/17 1030)     ED Discharge Orders    None       Note:  This document was prepared using Dragon voice recognition software and may include unintentional dictation errors.    Darci CurrentBrown,  N, MD 07/06/17 [phone]

## 2017-07-06 NOTE — Progress Notes (Signed)
Cynthia [name] to be D/C'd Home (being followed by [facility]) per MD order.  Discussed prescriptions and follow up appointments with the patient. Prescriptions given to patient, medication list explained in detail. Pt verbalized understanding. Husband at bedside.  Allergies as of 07/06/2017      Reactions   Penicillins Swelling, Other (See Comments)   Has patient had a PCN reaction causing immediate rash, facial/tongue/throat swelling, SOB or lightheadedness with hypotension: No Has patient had a PCN reaction causing severe rash involving mucus membranes or skin necrosis: No Has patient had a PCN reaction that required hospitalization: No Has patient had a PCN reaction occurring within the last 10 years: No If all of the above answers are "NO", then may proceed with Cephalosporin use.        Medication List    TAKE these medications   acetaminophen 650 MG CR tablet Commonly known as:  TYLENOL Take 650 mg by mouth every 12 (twelve) hours as needed for pain.   albuterol (2.5 MG/3ML) 0.083% nebulizer solution Commonly known as:  PROVENTIL Take 2.5 mg by nebulization every 4 (four) hours as needed for wheezing or shortness of breath.   aspirin EC 81 MG tablet Take 81 mg by mouth daily.   cephALEXin 500 MG capsule Commonly known as:  KEFLEX Take 1 capsule (500 mg total) by mouth 3 (three) times daily.   DESITIN 13 % Crea Generic drug:  Zinc Oxide Place 1 application into the perineum 4 (four) times daily as needed (skin protectant).   DULoxetine 60 MG capsule Commonly known as:  CYMBALTA Take 60 mg by mouth daily.   feeding supplement (ENSURE ENLIVE) Liqd Take 237 mLs by mouth 2 (two) times daily between meals.   Fluticasone-Salmeterol 100-50 MCG/DOSE Aepb Commonly known as:  ADVAIR DISKUS Inhale 1 puff into the lungs 2 (two) times daily.   furosemide 40 MG tablet Commonly known as:  LASIX Take 40 mg by mouth every Monday, Wednesday, and Friday.   gabapentin 300 MG  capsule Commonly known as:  NEURONTIN Take 300 mg by mouth at bedtime.   HYDROcodone-acetaminophen 5-325 MG tablet Commonly known as:  NORCO/VICODIN Take 1 tablet by mouth 2 (two) times daily as needed for moderate pain.   hydrocortisone cream 0.5 % Apply 1 application topically 2 (two) times daily as needed for itching.   ipratropium-albuterol 0.5-2.5 (3) MG/3ML Soln Commonly known as:  DUONEB Take 3 mLs by nebulization every 6 (six) hours.   isosorbide mononitrate 30 MG 24 hr tablet Commonly known as:  IMDUR Take 30 mg by mouth daily.   latanoprost 0.005 % ophthalmic solution Commonly known as:  XALATAN Place 1 drop into both eyes at bedtime.   naloxone 4 MG/0.1ML Liqd nasal spray kit Commonly known as:  NARCAN Place 1 spray into the nose once. Repeat every 3 minutes if needed for unresponsiveness   nitroGLYCERIN 0.3 MG SL tablet Commonly known as:  NITROSTAT Place 0.3 mg under the tongue every 5 (five) minutes as needed for chest pain.   predniSONE 10 MG (21) Tbpk tablet Commonly known as:  STERAPRED UNI-PAK 21 TAB Take 6 tabs first day, 5 tab on day 2, then 4 on day 3rd, 3 tabs on day 4th , 2 tab on day 5th, and 1 tab on 6th day.   QUEtiapine 25 MG tablet Commonly known as:  SEROQUEL Take 25 mg by mouth at bedtime.   salmeterol 50 MCG/DOSE diskus inhaler Commonly known as:  SEREVENT Inhale 1 puff into  the lungs 2 (two) times daily.   sennosides-docusate sodium 8.6-50 MG tablet Commonly known as:  SENOKOT-S Take 1 tablet by mouth daily.   SYSTANE 0.4-0.3 % Gel ophthalmic gel Generic drug:  Polyethyl Glycol-Propyl Glycol Place 1 application into both eyes at bedtime.   tiotropium 18 MCG inhalation capsule Commonly known as:  SPIRIVA Place 1 capsule (18 mcg total) into inhaler and inhale daily. What changed:  when to take this       Vitals:   07/06/17 0834 07/06/17 1101  BP:  128/75  Pulse: 69 61  Resp:  18  Temp:  98.4 F (36.9 C)  SpO2:  100%     Tele box removed and returned. Skin clean, dry and intact without evidence of skin break down, no evidence of skin tears noted. IV catheter discontinued intact. Site without signs and symptoms of complications. Dressing and pressure applied. Pt denies pain at this time. No complaints noted.  An After Visit Summary was printed and given to the patient. [facility] will provide transportation.  Rolley Sims

## 2017-07-06 NOTE — Care Management (Signed)
TC to [place] with [facility]. Left message regarding patients discharge.

## 2017-07-06 NOTE — Progress Notes (Signed)
Pt's AM BP 177/111 and pt has no PRN meds ordered. MD Sheryle Hailiamond notified and order received to place 0.5 inch of nitro paste onto the patient STAT. MD to place PRN orders. Will recheck BP in 1 hour. Lamonte RicherKara A Ethanael Veith, RN

## 2017-07-08 NOTE — Discharge Summary (Signed)
[facility] [place] NAME: Cynthia [name]    MR#:  [id number]  DATE OF BIRTH:  Dec 19, 1940  DATE OF ADMISSION:  07/05/2017   ADMITTING PHYSICIAN: Arta Silence, MD  DATE OF DISCHARGE: 07/06/2017  2:09 PM  PRIMARY CARE PHYSICIAN: System, Pcp Not In   ADMISSION DIAGNOSIS:  Chest Pain DISCHARGE DIAGNOSIS:  Active Problems:   Chest pain  SECONDARY DIAGNOSIS:   Past Medical History:  Diagnosis Date  . Blood clot in vein   . Coronary artery disease   . GERD (gastroesophageal reflux disease)   . Heart attack ([place])   . Hx of CABG 2013  . Hyperlipidemia   . Hypertension    HOSPITAL COURSE:  77 year old female with history of CAD admitted for Chest pain characterized both as sharp and dull, right-sided, coming/going, non-positional, non-reproducible, non-exertional, non-reproducible, non-pleuritic. Endorses 3-4wk Hx cough, non-productive, (+) SOB.   * Unstable angina: ruled out with serial troponins.  * CAD: seen by cardio. - Known coronary disease status post coronary bypass grafting with with a free LIMA to the LAD as well as saphenous vein graft to the OM and PDA.  Vein grafts were occluded by cath after CABG.  She has been treated medically due to diffuse disease with this.  She had a functional study in 2016 showing a large area of inferoapical inferobasilar fixed defect with no significant reversibility.  Functional study on 07/05/17 showed similar findings.  Based on coronary anatomy from previous catheter as well as functional study in 2016 as well as the this admission on 6/12 suggests no appreciable change.  - Cardio recommends medical mgmt  Should symptoms worsen, consideration for relook cardiac catheterization could be raised however given lack of reversible ischemia as well as known diffuse disease with occluded vein graft to the inferior and inferobasilar area, likely medical management is warranted. DISCHARGE CONDITIONS:   stable CONSULTS OBTAINED:  Treatment Team:  Arta Silence, MD Teodoro Spray, MD DRUG ALLERGIES:   Allergies  Allergen Reactions  . Penicillins Swelling and Other (See Comments)    Has patient had a PCN reaction causing immediate rash, facial/tongue/throat swelling, SOB or lightheadedness with hypotension: No Has patient had a PCN reaction causing severe rash involving mucus membranes or skin necrosis: No Has patient had a PCN reaction that required hospitalization: No Has patient had a PCN reaction occurring within the last 10 years: No If all of the above answers are "NO", then may proceed with Cephalosporin use.     DISCHARGE MEDICATIONS:   Allergies as of 07/06/2017      Reactions   Penicillins Swelling, Other (See Comments)   Has patient had a PCN reaction causing immediate rash, facial/tongue/throat swelling, SOB or lightheadedness with hypotension: No Has patient had a PCN reaction causing severe rash involving mucus membranes or skin necrosis: No Has patient had a PCN reaction that required hospitalization: No Has patient had a PCN reaction occurring within the last 10 years: No If all of the above answers are "NO", then may proceed with Cephalosporin use.        Medication List    TAKE these medications   acetaminophen 650 MG CR tablet Commonly known as:  TYLENOL Take 650 mg by mouth every 12 (twelve) hours as needed for pain.   albuterol (2.5 MG/3ML) 0.083% nebulizer solution Commonly known as:  PROVENTIL Take 2.5 mg by nebulization every 4 (four) hours as needed for wheezing or shortness of breath.  aspirin EC 81 MG tablet Take 81 mg by mouth daily.   cephALEXin 500 MG capsule Commonly known as:  KEFLEX Take 1 capsule (500 mg total) by mouth 3 (three) times daily.   DESITIN 13 % Crea Generic drug:  Zinc Oxide Place 1 application into the perineum 4 (four) times daily as needed (skin protectant).   DULoxetine 60 MG capsule Commonly known as:   CYMBALTA Take 60 mg by mouth daily.   feeding supplement (ENSURE ENLIVE) Liqd Take 237 mLs by mouth 2 (two) times daily between meals.   Fluticasone-Salmeterol 100-50 MCG/DOSE Aepb Commonly known as:  ADVAIR DISKUS Inhale 1 puff into the lungs 2 (two) times daily.   furosemide 40 MG tablet Commonly known as:  LASIX Take 40 mg by mouth every Monday, Wednesday, and Friday.   gabapentin 300 MG capsule Commonly known as:  NEURONTIN Take 300 mg by mouth at bedtime.   HYDROcodone-acetaminophen 5-325 MG tablet Commonly known as:  NORCO/VICODIN Take 1 tablet by mouth 2 (two) times daily as needed for moderate pain.   hydrocortisone cream 0.5 % Apply 1 application topically 2 (two) times daily as needed for itching.   ipratropium-albuterol 0.5-2.5 (3) MG/3ML Soln Commonly known as:  DUONEB Take 3 mLs by nebulization every 6 (six) hours.   isosorbide mononitrate 30 MG 24 hr tablet Commonly known as:  IMDUR Take 30 mg by mouth daily.   latanoprost 0.005 % ophthalmic solution Commonly known as:  XALATAN Place 1 drop into both eyes at bedtime.   naloxone 4 MG/0.1ML Liqd nasal spray kit Commonly known as:  NARCAN Place 1 spray into the nose once. Repeat every 3 minutes if needed for unresponsiveness   nitroGLYCERIN 0.3 MG SL tablet Commonly known as:  NITROSTAT Place 0.3 mg under the tongue every 5 (five) minutes as needed for chest pain.   predniSONE 10 MG (21) Tbpk tablet Commonly known as:  STERAPRED UNI-PAK 21 TAB Take 6 tabs first day, 5 tab on day 2, then 4 on day 3rd, 3 tabs on day 4th , 2 tab on day 5th, and 1 tab on 6th day.   QUEtiapine 25 MG tablet Commonly known as:  SEROQUEL Take 25 mg by mouth at bedtime.   salmeterol 50 MCG/DOSE diskus inhaler Commonly known as:  SEREVENT Inhale 1 puff into the lungs 2 (two) times daily.   sennosides-docusate sodium 8.6-50 MG tablet Commonly known as:  SENOKOT-S Take 1 tablet by mouth daily.   SYSTANE 0.4-0.3 % Gel  ophthalmic gel Generic drug:  Polyethyl Glycol-Propyl Glycol Place 1 application into both eyes at bedtime.   tiotropium 18 MCG inhalation capsule Commonly known as:  SPIRIVA Place 1 capsule (18 mcg total) into inhaler and inhale daily. What changed:  when to take this      DISCHARGE INSTRUCTIONS:   DIET:  Cardiac diet DISCHARGE CONDITION:  Good ACTIVITY:  Activity as tolerated OXYGEN:  Home Oxygen: No.  Oxygen Delivery: room air DISCHARGE LOCATION:  home   If you experience worsening of your admission symptoms, develop shortness of breath, life threatening emergency, suicidal or homicidal thoughts you must seek medical attention immediately by calling 911 or calling your MD immediately  if symptoms less severe.  You Must read complete instructions/literature along with all the possible adverse reactions/side effects for all the Medicines you take and that have been prescribed to you. Take any new Medicines after you have completely understood and accpet all the possible adverse reactions/side effects.   Please note  You were cared for by a hospitalist during your hospital stay. If you have any questions about your discharge medications or the care you received while you were in the hospital after you are discharged, you can call the unit and asked to speak with the hospitalist on call if the hospitalist that took care of you is not available. Once you are discharged, your primary care physician will handle any further medical issues. Please note that NO REFILLS for any discharge medications will be authorized once you are discharged, as it is imperative that you return to your primary care physician (or establish a relationship with a primary care physician if you do not have one) for your aftercare needs so that they can reassess your need for medications and monitor your lab values.    On the day of Discharge:  VITAL SIGNS:  Blood pressure 128/75, pulse 61, temperature 98.4 F  (36.9 C), temperature source Oral, resp. rate 18, height 6' (1.829 m), weight 97 kg (213 lb 12.8 oz), SpO2 100 %. PHYSICAL EXAMINATION:  GENERAL:  77 y.o.-year-old patient lying in the bed with no acute distress.  EYES: Pupils equal, round, reactive to light and accommodation. No scleral icterus. Extraocular muscles intact.  HEENT: Head atraumatic, normocephalic. Oropharynx and nasopharynx clear.  NECK:  Supple, no jugular venous distention. No thyroid enlargement, no tenderness.  LUNGS: Normal breath sounds bilaterally, no wheezing, rales,rhonchi or crepitation. No use of accessory muscles of respiration.  CARDIOVASCULAR: S1, S2 normal. No murmurs, rubs, or gallops.  ABDOMEN: Soft, non-tender, non-distended. Bowel sounds present. No organomegaly or mass.  EXTREMITIES: No pedal edema, cyanosis, or clubbing.  NEUROLOGIC: Cranial nerves II through XII are intact. Muscle strength 5/5 in all extremities. Sensation intact. Gait not checked.  PSYCHIATRIC: The patient is alert and oriented x 3.  SKIN: No obvious rash, lesion, or ulcer.  DATA REVIEW:   CBC Recent Labs  Lab 07/05/17 0040  WBC 3.5*  HGB 12.6  HCT 37.8  PLT 180    Chemistries  Recent Labs  Lab 07/05/17 0040  NA 141  K 4.0  CL 105  CO2 29  GLUCOSE 84  BUN 25*  CREATININE 1.51*  CALCIUM 8.9        Management plans discussed with the patient, family and they are in agreement.  CODE STATUS: Prior   TOTAL TIME TAKING CARE OF THIS PATIENT: 45 minutes.    Max Sane M.D on 07/08/2017 at 12:19 PM  Between 7am to 6pm - Pager - [phone]  After 6pm go to [email or web address] - Proofreader  [facility]  Office  [phone]  CC: Primary care physician; System, Pcp Not In   Note: This dictation was prepared with Dragon dictation along with smaller phrase technology. Any transcriptional errors that result from this process are unintentional.

## 2017-08-09 ENCOUNTER — Inpatient Hospital Stay
Admit: 2017-08-09 | Discharge: 2017-08-09 | Disposition: A | Discharge status: Home / Self Care | Payer: Medicare (Managed Care) | Attending: Internal Medicine | Admitting: Internal Medicine

## 2017-08-09 ENCOUNTER — Other Ambulatory Visit: Payer: Self-pay

## 2017-08-09 ENCOUNTER — Inpatient Hospital Stay
Admission: EM | Admit: 2017-08-09 | Discharge: 2017-08-10 | DRG: 291 | Disposition: A | Discharge status: Home / Self Care | Payer: Medicare (Managed Care) | Attending: Internal Medicine | Admitting: Internal Medicine

## 2017-08-09 ENCOUNTER — Emergency Department: Payer: Medicare (Managed Care)

## 2017-08-09 DIAGNOSIS — I2510 Atherosclerotic heart disease of native coronary artery without angina pectoris: Secondary | ICD-10-CM | POA: Diagnosis present

## 2017-08-09 DIAGNOSIS — Z79899 Other long term (current) drug therapy: Secondary | ICD-10-CM

## 2017-08-09 DIAGNOSIS — Z716 Tobacco abuse counseling: Secondary | ICD-10-CM

## 2017-08-09 DIAGNOSIS — Z7951 Long term (current) use of inhaled steroids: Secondary | ICD-10-CM | POA: Diagnosis not present

## 2017-08-09 DIAGNOSIS — I509 Heart failure, unspecified: Secondary | ICD-10-CM

## 2017-08-09 DIAGNOSIS — F419 Anxiety disorder, unspecified: Secondary | ICD-10-CM | POA: Diagnosis present

## 2017-08-09 DIAGNOSIS — F17210 Nicotine dependence, cigarettes, uncomplicated: Secondary | ICD-10-CM | POA: Diagnosis present

## 2017-08-09 DIAGNOSIS — I5023 Acute on chronic systolic (congestive) heart failure: Secondary | ICD-10-CM | POA: Diagnosis present

## 2017-08-09 DIAGNOSIS — Z880 Allergy status to penicillin: Secondary | ICD-10-CM

## 2017-08-09 DIAGNOSIS — I252 Old myocardial infarction: Secondary | ICD-10-CM

## 2017-08-09 DIAGNOSIS — Z7982 Long term (current) use of aspirin: Secondary | ICD-10-CM

## 2017-08-09 DIAGNOSIS — Z8249 Family history of ischemic heart disease and other diseases of the circulatory system: Secondary | ICD-10-CM | POA: Diagnosis not present

## 2017-08-09 DIAGNOSIS — I110 Hypertensive heart disease with heart failure: Secondary | ICD-10-CM | POA: Diagnosis not present

## 2017-08-09 DIAGNOSIS — J441 Chronic obstructive pulmonary disease with (acute) exacerbation: Secondary | ICD-10-CM | POA: Diagnosis present

## 2017-08-09 DIAGNOSIS — K219 Gastro-esophageal reflux disease without esophagitis: Secondary | ICD-10-CM | POA: Diagnosis present

## 2017-08-09 DIAGNOSIS — R410 Disorientation, unspecified: Secondary | ICD-10-CM | POA: Diagnosis present

## 2017-08-09 DIAGNOSIS — Z951 Presence of aortocoronary bypass graft: Secondary | ICD-10-CM | POA: Diagnosis not present

## 2017-08-09 DIAGNOSIS — J9621 Acute and chronic respiratory failure with hypoxia: Secondary | ICD-10-CM | POA: Diagnosis present

## 2017-08-09 DIAGNOSIS — R451 Restlessness and agitation: Secondary | ICD-10-CM | POA: Diagnosis present

## 2017-08-09 DIAGNOSIS — Z9981 Dependence on supplemental oxygen: Secondary | ICD-10-CM

## 2017-08-09 DIAGNOSIS — E785 Hyperlipidemia, unspecified: Secondary | ICD-10-CM | POA: Diagnosis present

## 2017-08-09 DIAGNOSIS — R4587 Impulsiveness: Secondary | ICD-10-CM | POA: Diagnosis present

## 2017-08-09 LAB — URINALYSIS, COMPLETE (UACMP) WITH MICROSCOPIC
Bacteria, UA: NONE SEEN
Bilirubin Urine: NEGATIVE
Glucose, UA: NEGATIVE mg/dL
Hgb urine dipstick: NEGATIVE
Ketones, ur: NEGATIVE mg/dL
Nitrite: NEGATIVE
Protein, ur: 30 mg/dL — AB
Specific Gravity, Urine: 1.026 (ref 1.005–1.030)
pH: 5 (ref 5.0–8.0)

## 2017-08-09 LAB — BASIC METABOLIC PANEL
ANION GAP: 5 (ref 5–15)
BUN: 19 mg/dL (ref 8–23)
CALCIUM: 9 mg/dL (ref 8.9–10.3)
CO2: 28 mmol/L (ref 22–32)
CREATININE: 1.27 mg/dL — AB (ref 0.44–1.00)
Chloride: 108 mmol/L (ref 98–111)
GFR calc Af Amer: 46 mL/min — ABNORMAL LOW (ref >60)
GFR calc non Af Amer: 40 mL/min — ABNORMAL LOW (ref >60)
Glucose, Bld: 94 mg/dL (ref 70–99)
Potassium: 4.2 mmol/L (ref 3.5–5.1)
Sodium: 141 mmol/L (ref 135–145)

## 2017-08-09 LAB — BRAIN NATRIURETIC PEPTIDE: B NATRIURETIC PEPTIDE 5: 530 pg/mL — AB (ref 0.0–100.0)

## 2017-08-09 LAB — CBC
HCT: 38.4 % (ref 35.0–47.0)
Hemoglobin: 12.9 g/dL (ref 12.0–16.0)
MCH: 34.2 pg — ABNORMAL HIGH (ref 26.0–34.0)
MCHC: 33.5 g/dL (ref 32.0–36.0)
MCV: 101.9 fL — ABNORMAL HIGH (ref 80.0–100.0)
Platelets: 144 K/uL — ABNORMAL LOW (ref 150–440)
RBC: 3.77 MIL/uL — ABNORMAL LOW (ref 3.80–5.20)
RDW: 14.1 % (ref 11.5–14.5)
WBC: 2.5 K/uL — ABNORMAL LOW (ref 3.6–11.0)

## 2017-08-09 LAB — TROPONIN I: Troponin I: 0.03 ng/mL (ref <0.03)

## 2017-08-09 LAB — GLUCOSE, CAPILLARY: Glucose-Capillary: 112 mg/dL — ABNORMAL HIGH (ref 70–99)

## 2017-08-09 MED ORDER — FUROSEMIDE 10 MG/ML IJ SOLN
20.0000 mg | Freq: Two times a day (BID) | INTRAMUSCULAR | Status: DC
  Administered 2017-08-09 – 2017-08-10 (×2): 20 mg via INTRAVENOUS
  Filled 2017-08-09 (×2): qty 2

## 2017-08-09 MED ORDER — ONDANSETRON HCL 4 MG PO TABS: 4.0000 mg | ORAL_TABLET | Freq: Four times a day (QID) | ORAL | Status: DC | PRN

## 2017-08-09 MED ORDER — ISOSORBIDE MONONITRATE ER 30 MG PO TB24
30.0000 mg | ORAL_TABLET | Freq: Every day | ORAL | Status: DC
  Administered 2017-08-09 – 2017-08-10 (×2): 30 mg via ORAL
  Filled 2017-08-09 (×2): qty 1

## 2017-08-09 MED ORDER — METHYLPREDNISOLONE SODIUM SUCC 125 MG IJ SOLR
125.0000 mg | INTRAMUSCULAR | Status: AC
  Administered 2017-08-09: 125 mg via INTRAVENOUS
  Filled 2017-08-09: qty 2

## 2017-08-09 MED ORDER — POLYETHYL GLYCOL-PROPYL GLYCOL 0.4-0.3 % OP GEL: 1.0000 "application " | Freq: Every day | OPHTHALMIC | Status: DC

## 2017-08-09 MED ORDER — GABAPENTIN 300 MG PO CAPS
300.0000 mg | ORAL_CAPSULE | Freq: Every day | ORAL | Status: DC
  Administered 2017-08-09: 300 mg via ORAL
  Filled 2017-08-09: qty 1

## 2017-08-09 MED ORDER — IPRATROPIUM-ALBUTEROL 0.5-2.5 (3) MG/3ML IN SOLN
RESPIRATORY_TRACT | Status: AC
  Filled 2017-08-09: qty 3

## 2017-08-09 MED ORDER — IPRATROPIUM-ALBUTEROL 0.5-2.5 (3) MG/3ML IN SOLN
3.0000 mL | Freq: Once | RESPIRATORY_TRACT | Status: AC
  Administered 2017-08-09: 3 mL via RESPIRATORY_TRACT

## 2017-08-09 MED ORDER — ACETAMINOPHEN 325 MG PO TABS
650.0000 mg | ORAL_TABLET | Freq: Four times a day (QID) | ORAL | Status: DC | PRN
  Administered 2017-08-09 – 2017-08-10 (×3): 650 mg via ORAL
  Filled 2017-08-09 (×3): qty 2

## 2017-08-09 MED ORDER — ACETAMINOPHEN 650 MG RE SUPP: 650.0000 mg | Freq: Four times a day (QID) | RECTAL | Status: DC | PRN

## 2017-08-09 MED ORDER — HALOPERIDOL LACTATE 5 MG/ML IJ SOLN
2.5000 mg | INTRAMUSCULAR | Status: DC | PRN
  Filled 2017-08-09: qty 1

## 2017-08-09 MED ORDER — ENOXAPARIN SODIUM 40 MG/0.4ML ~~LOC~~ SOLN
40.0000 mg | SUBCUTANEOUS | Status: DC
  Administered 2017-08-09: 40 mg via SUBCUTANEOUS
  Filled 2017-08-09: qty 0.4

## 2017-08-09 MED ORDER — LORAZEPAM 2 MG/ML IJ SOLN
0.5000 mg | Freq: Once | INTRAMUSCULAR | Status: AC
  Administered 2017-08-09: 0.5 mg via INTRAVENOUS
  Filled 2017-08-09: qty 1

## 2017-08-09 MED ORDER — ALBUTEROL SULFATE (2.5 MG/3ML) 0.083% IN NEBU
2.5000 mg | INHALATION_SOLUTION | Freq: Once | RESPIRATORY_TRACT | Status: AC
  Administered 2017-08-09: 2.5 mg via RESPIRATORY_TRACT
  Filled 2017-08-09: qty 3

## 2017-08-09 MED ORDER — HYDROCODONE-ACETAMINOPHEN 5-325 MG PO TABS: 1.0000 | ORAL_TABLET | Freq: Two times a day (BID) | ORAL | Status: DC | PRN

## 2017-08-09 MED ORDER — TIOTROPIUM BROMIDE MONOHYDRATE 18 MCG IN CAPS
18.0000 ug | ORAL_CAPSULE | Freq: Every day | RESPIRATORY_TRACT | Status: DC
  Administered 2017-08-10: 18 ug via RESPIRATORY_TRACT
  Filled 2017-08-09: qty 5

## 2017-08-09 MED ORDER — NITROGLYCERIN 0.3 MG SL SUBL: 0.3000 mg | SUBLINGUAL_TABLET | SUBLINGUAL | Status: DC | PRN

## 2017-08-09 MED ORDER — POLYVINYL ALCOHOL 1.4 % OP SOLN
1.0000 [drp] | Freq: Every day | OPHTHALMIC | Status: DC
  Filled 2017-08-09: qty 15

## 2017-08-09 MED ORDER — NICOTINE 21 MG/24HR TD PT24
21.0000 mg | MEDICATED_PATCH | Freq: Every day | TRANSDERMAL | Status: DC
  Administered 2017-08-09 – 2017-08-10 (×2): 21 mg via TRANSDERMAL
  Filled 2017-08-09 (×2): qty 1

## 2017-08-09 MED ORDER — LATANOPROST 0.005 % OP SOLN
1.0000 [drp] | Freq: Every day | OPHTHALMIC | Status: DC
  Filled 2017-08-09: qty 2.5

## 2017-08-09 MED ORDER — QUETIAPINE FUMARATE 25 MG PO TABS
25.0000 mg | ORAL_TABLET | Freq: Every day | ORAL | Status: DC
  Administered 2017-08-09: 25 mg via ORAL
  Filled 2017-08-09: qty 1

## 2017-08-09 MED ORDER — DULOXETINE HCL 30 MG PO CPEP
60.0000 mg | ORAL_CAPSULE | Freq: Every day | ORAL | Status: DC
  Administered 2017-08-10: 60 mg via ORAL
  Filled 2017-08-09: qty 2

## 2017-08-09 MED ORDER — ALBUTEROL SULFATE (2.5 MG/3ML) 0.083% IN NEBU
2.5000 mg | INHALATION_SOLUTION | RESPIRATORY_TRACT | Status: DC | PRN
  Filled 2017-08-09: qty 3

## 2017-08-09 MED ORDER — AZITHROMYCIN 500 MG PO TABS
500.0000 mg | ORAL_TABLET | Freq: Once | ORAL | Status: AC
  Administered 2017-08-09: 500 mg via ORAL
  Filled 2017-08-09: qty 1

## 2017-08-09 MED ORDER — HALOPERIDOL LACTATE 5 MG/ML IJ SOLN
2.5000 mg | Freq: Once | INTRAMUSCULAR | Status: AC
  Administered 2017-08-09: 2.5 mg via INTRAVENOUS

## 2017-08-09 MED ORDER — HALOPERIDOL LACTATE 5 MG/ML IJ SOLN
1.0000 mg | Freq: Four times a day (QID) | INTRAMUSCULAR | Status: DC | PRN
  Administered 2017-08-09: 1 mg via INTRAVENOUS
  Filled 2017-08-09: qty 1

## 2017-08-09 MED ORDER — ASPIRIN EC 81 MG PO TBEC
81.0000 mg | DELAYED_RELEASE_TABLET | Freq: Every day | ORAL | Status: DC
  Administered 2017-08-09 – 2017-08-10 (×2): 81 mg via ORAL
  Filled 2017-08-09 (×2): qty 1

## 2017-08-09 MED ORDER — METHYLPREDNISOLONE SODIUM SUCC 125 MG IJ SOLR
60.0000 mg | INTRAMUSCULAR | Status: DC
  Administered 2017-08-09: 60 mg via INTRAVENOUS
  Filled 2017-08-09: qty 2

## 2017-08-09 MED ORDER — NITROGLYCERIN 0.4 MG SL SUBL
0.4000 mg | SUBLINGUAL_TABLET | SUBLINGUAL | Status: DC | PRN
  Administered 2017-08-09: 0.4 mg via SUBLINGUAL
  Filled 2017-08-09 (×2): qty 1

## 2017-08-09 MED ORDER — ALBUTEROL SULFATE (2.5 MG/3ML) 0.083% IN NEBU
2.5000 mg | INHALATION_SOLUTION | Freq: Four times a day (QID) | RESPIRATORY_TRACT | Status: DC
  Administered 2017-08-09 – 2017-08-10 (×3): 2.5 mg via RESPIRATORY_TRACT
  Filled 2017-08-09 (×4): qty 3

## 2017-08-09 MED ORDER — ONDANSETRON HCL 4 MG/2ML IJ SOLN
4.0000 mg | Freq: Four times a day (QID) | INTRAMUSCULAR | Status: DC | PRN
Start: 2017-08-09 — End: 2017-08-10

## 2017-08-09 MED ORDER — FUROSEMIDE 10 MG/ML IJ SOLN
INTRAMUSCULAR | Status: AC
  Filled 2017-08-09: qty 4

## 2017-08-09 MED ORDER — SENNOSIDES-DOCUSATE SODIUM 8.6-50 MG PO TABS: 1.0000 | ORAL_TABLET | Freq: Every day | ORAL | Status: DC

## 2017-08-09 MED ORDER — ARFORMOTEROL TARTRATE 15 MCG/2ML IN NEBU
15.0000 ug | INHALATION_SOLUTION | Freq: Two times a day (BID) | RESPIRATORY_TRACT | Status: DC
Start: 2017-08-09 — End: 2017-08-10
  Administered 2017-08-09 – 2017-08-10 (×2): 15 ug via RESPIRATORY_TRACT
  Filled 2017-08-09 (×4): qty 2

## 2017-08-09 MED ORDER — FUROSEMIDE 10 MG/ML IJ SOLN
20.0000 mg | Freq: Once | INTRAMUSCULAR | Status: AC
  Administered 2017-08-09: 20 mg via INTRAVENOUS

## 2017-08-09 NOTE — ED Notes (Signed)
Assisted PT with bed pan. Gave PT water and graham crackers.

## 2017-08-09 NOTE — Progress Notes (Signed)
Family Meeting Note  Advance Directive unsure Today a meeting took place with the husband terry Walther on phone  Pt is unable to participate due NW:[id number]:Lacked capacity /confusion  Pt being admitted for CHF and copd flare. she is confused. D/w husband and he would like for pt to be Full code for now   Time spent during discussion: 16 mins  Enedina FinnerSona Rayven Rettig, MD

## 2017-08-09 NOTE — H&P (Signed)
[facility] [place] [facility]   PATIENT NAME: Cynthia [name]    MR#:  [id number]  DATE OF BIRTH:  Aug 31, 1940  DATE OF ADMISSION:  08/09/2017  PRIMARY CARE PHYSICIAN: System, Pcp Not In   REQUESTING/REFERRING PHYSICIAN: dr Fanny Bien  CHIEF COMPLAINT:   Patient brought in with lithology according to the husband. Currently no family present. Patient is a very poor historian quite agitated and irritable here on the floor. HISTORY OF PRESENT ILLNESS:  Cynthia [name]  is a 77 y.o. female with a known history of COPD with ongoing tobacco abuse has chronic oxygen at home uses erratically, coronary artery disease is post CABG, hypertension hyperlipidemia comes to the emergency room after brought in by husband with lithology. Patient was found to be wheezing with short of breath. She was found to be in congestive heart failure with elevated BNP and COPD exacerbation. She received IV Lasix and IV Solu-Medrol.  Patient brought to the floor she is very agitated irritable and trying to get out of bed. She has mixed emotions very labile.  Spoke with patient's husband Cynthia [name] who is on his way.  PAST MEDICAL HISTORY:   Past Medical History:  Diagnosis Date  . Blood clot in vein   . Coronary artery disease   . GERD (gastroesophageal reflux disease)   . Heart attack ([facility])   . Hx of CABG 2013  . Hyperlipidemia   . Hypertension     PAST SURGICAL HISTOIRY:   Past Surgical History:  Procedure Laterality Date  . CARDIAC SURGERY    . HIP SURGERY Left 12/2014  . SHOULDER SURGERY Bilateral     SOCIAL HISTORY:   Social History   Tobacco Use  . Smoking status: Current Every Day Smoker    Packs/day: 0.10    Years: 35.00    Pack years: 3.50    Types: Cigarettes  . Smokeless tobacco: Never Used  Substance Use Topics  . Alcohol use: No    FAMILY HISTORY:   Family History  Problem Relation Age of Onset  . Hypertension Other   . Alcohol abuse Other     DRUG  ALLERGIES:   Allergies  Allergen Reactions  . Penicillins Swelling and Other (See Comments)    Has patient had a PCN reaction causing immediate rash, facial/tongue/throat swelling, SOB or lightheadedness with hypotension: No Has patient had a PCN reaction causing severe rash involving mucus membranes or skin necrosis: No Has patient had a PCN reaction that required hospitalization: No Has patient had a PCN reaction occurring within the last 10 years: No If all of the above answers are "NO", then may proceed with Cephalosporin use.      REVIEW OF SYSTEMS:  Review of Systems  Unable to perform ROS: Mental status change     MEDICATIONS AT HOME:   Prior to Admission medications   Medication Sig Start Date End Date Taking? Authorizing Provider  acetaminophen (TYLENOL) 650 MG CR tablet Take 650 mg by mouth every 12 (twelve) hours as needed for pain.   Yes [provider]  albuterol (PROVENTIL) (2.5 MG/3ML) 0.083% nebulizer solution Take 2.5 mg by nebulization every 4 (four) hours as needed for wheezing or shortness of breath.   Yes [provider]  aspirin EC 81 MG tablet Take 81 mg by mouth daily.   Yes [provider]  benzonatate (TESSALON) 100 MG capsule Take 100 mg by mouth 3 (three) times daily as needed for cough.   Yes [provider]  DULoxetine (CYMBALTA) 60 MG capsule Take 60 mg by mouth daily.   Yes [provider]  furosemide (LASIX) 40 MG tablet Take 40 mg by mouth every Monday, Wednesday, and Friday.    Yes [provider]  gabapentin (NEURONTIN) 300 MG capsule Take 300 mg by mouth at bedtime.   Yes [provider]  ipratropium-albuterol (DUONEB) 0.5-2.5 (3) MG/3ML SOLN Take 3 mLs by nebulization every 6 (six) hours.    Yes [provider]  isosorbide mononitrate (IMDUR) 30 MG 24 hr tablet Take 30 mg by mouth daily.   Yes [provider]  latanoprost (XALATAN) 0.005 % ophthalmic solution Place 1  drop into both eyes at bedtime.   Yes [provider]  LORazepam (ATIVAN) 2 MG/ML concentrated solution Take 0.5 mg by mouth every 2 (two) hours as needed for anxiety (agitation).   Yes [provider]  Morphine Sulfate (MORPHINE CONCENTRATE) 10 mg / 0.5 ml concentrated solution Place 4 mg under the tongue every 2 (two) hours as needed for severe pain.   Yes [provider]  naloxone (NARCAN) nasal spray 4 mg/0.1 mL Place 1 spray into the nose once. Repeat every 3 minutes if needed for unresponsiveness   Yes [provider]  nitroGLYCERIN (NITROSTAT) 0.3 MG SL tablet Place 0.3 mg under the tongue every 5 (five) minutes as needed for chest pain.   Yes [provider]  Polyethyl Glycol-Propyl Glycol (SYSTANE) 0.4-0.3 % GEL ophthalmic gel Place 1 application into both eyes at bedtime.   Yes [provider]  QUEtiapine (SEROQUEL) 25 MG tablet Take 25 mg by mouth at bedtime.   Yes [provider]  salmeterol (SEREVENT) 50 MCG/DOSE diskus inhaler Inhale 1 puff into the lungs 2 (two) times daily.   Yes [provider]  sennosides-docusate sodium (SENOKOT-S) 8.6-50 MG tablet Take 1 tablet by mouth daily.   Yes [provider]  Zinc Oxide (DESITIN) 13 % CREA Place 1 application into the perineum 4 (four) times daily as needed (skin protectant).   Yes [provider]  Fluticasone-Salmeterol (ADVAIR DISKUS) 100-50 MCG/DOSE AEPB Inhale 1 puff into the lungs 2 (two) times daily. Patient not taking: Reported on 08/09/2017 03/28/17 03/28/18  Altamese Dilling, MD  HYDROcodone-acetaminophen (NORCO/VICODIN) 5-325 MG tablet Take 1 tablet by mouth 2 (two) times daily as needed for moderate pain.    [provider]  hydrocortisone cream 0.5 % Apply 1 application topically 2 (two) times daily as needed for itching.    [provider]  tiotropium (SPIRIVA) 18 MCG inhalation capsule Place 1 capsule (18 mcg total) into  inhaler and inhale daily. Patient not taking: Reported on 08/09/2017 07/06/17   Delfino Lovett, MD      VITAL SIGNS:  Blood pressure (!) 173/110, pulse 82, temperature (!) 97.5 F (36.4 C), temperature source Oral, resp. rate 20, height 5\' 11"  (1.803 m), weight 100.7 kg (222 lb 0.1 oz), SpO2 95 %.  PHYSICAL EXAMINATION:  GENERAL:  77 y.o.-year-old patient lying in the bed with no acute distress. She is very irritated, impulsive, restless, screaming EYES: Pupils equal, round, reactive to light and accommodation. No scleral icterus. Extraocular muscles intact.  HEENT: Head atraumatic, normocephalic. Oropharynx and nasopharynx clear.  NECK:  Supple, no jugular venous distention. No thyroid enlargement, no tenderness.  LUNGS:diffuse breath sounds bilaterally, ++ wheezing,no  rales,rhonchi or crepitation. No use of accessory muscles of respiration.  CARDIOVASCULAR: S1, S2 normal. No murmurs, rubs, or gallops.  ABDOMEN: Soft, nontender,  nondistended. Bowel sounds present. No organomegaly or mass.  EXTREMITIES: No pedal edema, cyanosis, or clubbing.  NEUROLOGIC: Cranial nerves II through XII are intact. Muscle strength 5/5 in all extremities. Sensation intact. Gait not checked.  PSYCHIATRIC: The patient is alert  SKIN: No obvious rash, lesion, or ulcer.   LABORATORY PANEL:   CBC Recent Labs  Lab 08/09/17 0813  WBC 2.5*  HGB 12.9  HCT 38.4  PLT 144*   ------------------------------------------------------------------------------------------------------------------  Chemistries  Recent Labs  Lab 08/09/17 0813  NA 141  K 4.2  CL 108  CO2 28  GLUCOSE 94  BUN 19  CREATININE 1.27*  CALCIUM 9.0   ------------------------------------------------------------------------------------------------------------------  Cardiac Enzymes Recent Labs  Lab 08/09/17 0813  TROPONINI 0.03*    ------------------------------------------------------------------------------------------------------------------  RADIOLOGY:  Dg Chest 2 View  Result Date: 08/09/2017 CLINICAL DATA:  Cough and wheezing EXAM: CHEST - 2 VIEW COMPARISON:  July 05, 2017 chest radiograph and chest CT Jun 10, 2015 FINDINGS: There is mild chronic interstitial thickening. There is no frank edema or consolidation. There is cardiomegaly with pulmonary vascularity within normal limits. Patient is status post coronary artery bypass grafting. There is aortic atherosclerosis. No adenopathy. There is postoperative change in the left neck region. There is degenerative change in each shoulder. IMPRESSION: Stable cardiomegaly. Aortic atherosclerosis. Chronic interstitial thickening without frank edema or consolidation evident. Aortic Atherosclerosis (ICD10-I70.0). Electronically Signed   By: Bretta BangWilliam  Woodruff III M.D.   On: 08/09/2017 09:02    EKG:    IMPRESSION AND PLAN:   Jason Coopris Endsley  is a [age] y.o. female with a known history of COPD with ongoing tobacco abuse has chronic oxygen at home uses erratically, coronary artery disease is post CABG, hypertension hyperlipidemia comes to the emergency room after brought in by husband with lithology. Patient was found to be wheezing with short of breath. She was found to be in congestive heart failure with elevated BNP and COPD exacerbation.  1.Acute on chronic hypoxic respiratory failure due to COPD exacerbation acute on chronic and CHF suspected systolic with EF of 35% and on previous my heavy stress test done several years ago. -Admit to telemetry -iv Lasix 20 mg BID -patient received one dose of IV Solu-Medrol 125 mg. Will continue PRN nebs and inhalers -no indication for antibiotics.  2. acute on chronic COPD exacerbation with ongoing tobacco abuse -continue inhalers, IV Solu-Medrol, PRN nebs -smoking cessation advised  3. Altered mental status with confusion -this patient  has anxiety when she comes to the hospital and has issues with staying put and listening to staff. She already has been trying to get out of the bed, impulsive, restless and agitated -patient's husband to come and be by patient's side to help her calm down  4. CAD status post CABG -continue home meds  5. DVT prophylaxis subcu Lovenox  Discussed with patient's husband on the phone    All the records are reviewed and case discussed with ED provider. Management plans discussed with the patient, family and they are in agreement.  CODE STATUS: full  TOTAL TIME TAKING CARE OF THIS PATIENT: *45* minutes.    Enedina FinnerSona Smera Guyette M.D on 08/09/2017 at 3:23 PM  Between 7am to 6pm - Pager - [phone]  After 6pm go to [email or web address] - password [email or web address] [facility]  [facility]  Office  [phone]  CC: Primary care physician; System, Pcp Not In

## 2017-08-09 NOTE — ED Notes (Addendum)
Report given to Amy, RN. Aware nitrostat and ativan just given.

## 2017-08-09 NOTE — ED Triage Notes (Signed)
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

## 2017-08-09 NOTE — ED Notes (Signed)
Pt visibly upset asking for husband and son. Pt attempting to get out of bed. Full linen change and repositioned pt in bed to position of comfort.

## 2017-08-09 NOTE — ED Notes (Signed)
Checked Glucose 112

## 2017-08-09 NOTE — ED Notes (Signed)
2 unsuccessful IV attempts by this nurse.

## 2017-08-09 NOTE — ED Notes (Signed)
PT had to use restroom. Had to clean PT up,and change bedding.

## 2017-08-09 NOTE — ED Provider Notes (Addendum)
Patient has become quite tearful and concerned that she is being admitted to the hospital.  [facility] reporting that previously she is not done well due to anxiety while in the hospital.  Patient does require admission, and she understands that is agreeable but just seems very upset and tearful and does not like being hospitalized.  She is rather agitated off and on, requiring nursing staff to come frequently calm her.  Will provide a slight dose of 0.5 mg of Ativan for anxiolysis and updated Dr. Katheren ShamsSalary who updated Dr. Allena KatzPatel.     Sharyn CreamerQuale, Mark, MD 08/09/17 1409    Sharyn CreamerQuale, Mark, MD 08/09/17 1410

## 2017-08-09 NOTE — ED Provider Notes (Signed)
[facility] Emergency Department Provider Note   ____________________________________________   First MD Initiated Contact with Patient 08/09/17 0813     (approximate)  I have reviewed the triage vital signs and the nursing notes.   HISTORY  Chief Complaint Altered Mental Status and Wheezing    HPI Cynthia [name] is a 77 y.o. female history of coronary disease, previous CABG, COPD  Patient presents today, seen for change in mental status.  Patient was sent from home, reported decreased level of consciousness.  No family at the bedside at present but EMS history is obtained, the reports she does seem kind of sleepy or somnolent.  Also notes she has had a cough.  Patient reports she has had "a cough" she feels tired.  She also feels slightly short of breath.  No fevers or chills.  No nausea vomiting.  This should be noted the patient is alert to self, place but reports he was 2018 and is not sure what month it is.  Denies headache.  No abdominal pain.   Past Medical History:  Diagnosis Date  . Blood clot in vein   . Coronary artery disease   . GERD (gastroesophageal reflux disease)   . Heart attack (HCC)   . Hx of CABG 2013  . Hyperlipidemia   . Hypertension     Patient Active Problem List   Diagnosis Date Noted  . CHF (congestive heart failure) (HCC) 08/09/2017  . COPD exacerbation (HCC) 03/26/2017  . Pelvic fracture (HCC) 04/30/2016  . HTN (hypertension) 04/30/2016  . HLD (hyperlipidemia) 04/30/2016  . GERD (gastroesophageal reflux disease) 04/30/2016  . CAD (coronary artery disease) 04/30/2016  . Chest pain 09/08/2014  . Dehydration 09/08/2014    Past Surgical History:  Procedure Laterality Date  . CARDIAC SURGERY    . HIP SURGERY Left 12/2014  . SHOULDER SURGERY Bilateral     Prior to Admission medications   Medication Sig Start Date End Date Taking? Authorizing Provider  acetaminophen (TYLENOL) 650 MG CR tablet Take 650  mg by mouth every 12 (twelve) hours as needed for pain.    [provider]  albuterol (PROVENTIL) (2.5 MG/3ML) 0.083% nebulizer solution Take 2.5 mg by nebulization every 4 (four) hours as needed for wheezing or shortness of breath.    [provider]  aspirin EC 81 MG tablet Take 81 mg by mouth daily.    [provider]  DULoxetine (CYMBALTA) 60 MG capsule Take 60 mg by mouth daily.    [provider]  Fluticasone-Salmeterol (ADVAIR DISKUS) 100-50 MCG/DOSE AEPB Inhale 1 puff into the lungs 2 (two) times daily. Patient not taking: Reported on 07/05/2017 03/28/17 03/28/18  Altamese Dilling, MD  furosemide (LASIX) 40 MG tablet Take 40 mg by mouth every Monday, Wednesday, and Friday.     [provider]  gabapentin (NEURONTIN) 300 MG capsule Take 300 mg by mouth at bedtime.    [provider]  HYDROcodone-acetaminophen (NORCO/VICODIN) 5-325 MG tablet Take 1 tablet by mouth 2 (two) times daily as needed for moderate pain.    [provider]  hydrocortisone cream 0.5 % Apply 1 application topically 2 (two) times daily as needed for itching.    [provider]  ipratropium-albuterol (DUONEB) 0.5-2.5 (3) MG/3ML SOLN Take 3 mLs by nebulization every 6 (six) hours.     [provider]  isosorbide mononitrate (IMDUR) 30 MG 24 hr tablet Take 30 mg by mouth daily.    [provider]  latanoprost (  XALATAN) 0.005 % ophthalmic solution Place 1 drop into both eyes at bedtime.    [provider]  naloxone [facility]) nasal spray 4 mg/0.1 mL Place 1 spray into the nose once. Repeat every 3 minutes if needed for unresponsiveness    [provider]  nitroGLYCERIN (NITROSTAT) 0.3 MG SL tablet Place 0.3 mg under the tongue every 5 (five) minutes as needed for chest pain.    [provider]  Polyethyl Glycol-Propyl Glycol (SYSTANE) 0.4-0.3 % GEL ophthalmic gel Place 1 application into both eyes at bedtime.     [provider]  QUEtiapine (SEROQUEL) 25 MG tablet Take 25 mg by mouth at bedtime.    [provider]  salmeterol (SEREVENT) 50 MCG/DOSE diskus inhaler Inhale 1 puff into the lungs 2 (two) times daily.    [provider]  sennosides-docusate sodium (SENOKOT-S) 8.6-50 MG tablet Take 1 tablet by mouth daily.    [provider]  tiotropium (SPIRIVA) 18 MCG inhalation capsule Place 1 capsule (18 mcg total) into inhaler and inhale daily. 07/06/17   Delfino Lovett, MD  Zinc Oxide (DESITIN) 13 % CREA Place 1 application into the perineum 4 (four) times daily as needed (skin protectant).    [provider]    Allergies Penicillins  Family History  Problem Relation Age of Onset  . Hypertension Other   . Alcohol abuse Other     Social History Social History   Tobacco Use  . Smoking status: Current Every Day Smoker    Packs/day: 0.10    Years: 35.00    Pack years: 3.50    Types: Cigarettes  . Smokeless tobacco: Never Used  Substance Use Topics  . Alcohol use: No  . Drug use: No    Review of Systems Constitutional: No fever/chills Eyes: No visual changes. ENT: No sore throat. Cardiovascular: Denies chest pain. Respiratory: Shortness of breath and some cough. Gastrointestinal: No abdominal pain.  No nausea, no vomiting.  No diarrhea.  No constipation. Genitourinary: Negative for dysuria. Musculoskeletal: Negative for back pain.  Slight leg swelling Skin: Negative for rash. Neurological: Negative for headaches, focal weakness or numbness.    ____________________________________________   PHYSICAL EXAM:  VITAL SIGNS: ED Triage Vitals  Enc Vitals Group     BP 08/09/17 0832 (!) 154/94     Pulse Rate 08/09/17 0832 76     Resp 08/09/17 0832 20     Temp 08/09/17 0832 98.9 F (37.2 C)     Temp Source 08/09/17 0832 Oral     SpO2 08/09/17 0832 95 %     Weight 08/09/17 0833 215 lb (97.5 kg)     Height 08/09/17 0833 5\' 7"  (1.702 m)      Head Circumference --      Peak Flow --      Pain Score 08/09/17 0833 0     Pain Loc --      Pain Edu? --      Excl. in GC? --     Constitutional: Alert and oriented to self, but not to place.  Somewhat somnolent, but arousable and conversant when spoken to.  Mild use of accessory muscles Eyes: Conjunctivae are normal. Head: Atraumatic. Nose: No congestion/rhinnorhea. Mouth/Throat: Mucous membranes are moist. Neck: No stridor.   Cardiovascular: Normal rate, regular rhythm. Grossly normal heart sounds.  Good peripheral circulation. Respiratory: Mild use excessive muscles.  Patient has moderate to severe end expiratory wheezing.  Questionable slight Rales in the bases versus slight wet crackles bilaterally.  She is able to speak in phrases.  Normal oxygen saturation on room air. Gastrointestinal: Soft and nontender. No distention. Musculoskeletal: No lower extremity tenderness nor edema. Neurologic:  Normal speech and language. No gross focal neurologic deficits are appreciated she appears just generally weak in the extremities without focality.  Symmetric smile.  Normal extra ocular movements. Skin:  Skin is warm, dry and intact. No rash noted. Psychiatric: Mood and affect are flat, somnolent. Speech and behavior are normal.  ____________________________________________   LABS (all labs ordered are listed, but only abnormal results are displayed)  Labs Reviewed  CBC - Abnormal; Notable for the following components:      Result Value   WBC 2.5 (*)    RBC 3.77 (*)    MCV 101.9 (*)    MCH 34.2 (*)    Platelets 144 (*)    All other components within normal limits  BASIC METABOLIC PANEL - Abnormal; Notable for the following components:   Creatinine, Ser 1.27 (*)    GFR calc non Af Amer 40 (*)    GFR calc Af Amer 46 (*)    All other components within normal limits  BRAIN NATRIURETIC PEPTIDE - Abnormal; Notable for the following components:   B Natriuretic Peptide 530.0 (*)    All  other components within normal limits  TROPONIN I - Abnormal; Notable for the following components:   Troponin I 0.03 (*)    All other components within normal limits  BLOOD GAS, VENOUS - Abnormal; Notable for the following components:   Bicarbonate 30.1 (*)    Acid-Base Excess 2.8 (*)    All other components within normal limits  URINALYSIS, COMPLETE (UACMP) WITH MICROSCOPIC - Abnormal; Notable for the following components:   Color, Urine YELLOW (*)    APPearance HAZY (*)    Protein, ur 30 (*)    Leukocytes, UA SMALL (*)    Non Squamous Epithelial 0-5 (*)    All other components within normal limits   ____________________________________________  EKG  Reviewed enterotomy at 8:10 AM Heart rate 70 QRS 150 QTc 400 A. fib, right bundle branch block.  No evidence of acute ischemia ____________________________________________  RADIOLOGY    Chest x-ray reviewed, negative for acute. ____________________________________________   PROCEDURES  Procedure(s) performed: None  Procedures  Critical Care performed: No  ____________________________________________   INITIAL IMPRESSION / ASSESSMENT AND PLAN / ED COURSE  Pertinent labs & imaging results that were available during my care of the patient were reviewed by me and considered in my medical decision making (see chart for details).  Patient resents for alteration in mental status, found to have mildly increased work of breathing and notable wheezing.  History of COPD, suspect COPD exacerbation likely some bronchitis with associated cough.  In addition may be some slight volume overload with an elevated BNP.  No evidence of acute neurologic or cardiac etiology.  Troponin is elevated at baseline.  Clinical Course as of Aug 10 1015  Wed Aug 09, 2017  1610 Patient reports some improvement in her breathing, but she is noted to have mild use of accessory muscles still with some mild end expiratory wheezing but overall some  improvement in aeration.  She does appear better, but still demonstrates speaking in phrases and mild accessory muscle use.  Will give additional neb treatment.  Awaiting x-ray    [MQ]  0921 Discussed with [facility] RN Brett Canales), updated on status and ER visit. Patient is a DNR in their records but can be  hospitalized.    [MQ]  [phone] [facility] reports patient is ususally alert, talkative and partially oriented (mild dementia) at office visits.    [MQ]    Clinical Course User Index [MQ] Sharyn CreamerQuale, Judaea Burgoon, MD     ____________________________________________   FINAL CLINICAL IMPRESSION(S) / ED DIAGNOSES  Final diagnoses:  Acute on chronic congestive heart failure, unspecified heart failure type (HCC)  COPD with acute exacerbation (HCC)      NEW MEDICATIONS STARTED DURING THIS VISIT:  New Prescriptions   No medications on file     Note:  This document was prepared using Dragon voice recognition software and may include unintentional dictation errors.     Sharyn CreamerQuale, Terrin Meddaugh, MD 08/09/17 1110

## 2017-08-10 LAB — BASIC METABOLIC PANEL
Anion gap: 9 (ref 5–15)
BUN: 18 mg/dL (ref 8–23)
CALCIUM: 9.2 mg/dL (ref 8.9–10.3)
CHLORIDE: 106 mmol/L (ref 98–111)
CO2: 27 mmol/L (ref 22–32)
CREATININE: 1.02 mg/dL — AB (ref 0.44–1.00)
GFR calc Af Amer: >60 mL/min (ref >60)
GFR calc non Af Amer: 52 mL/min — ABNORMAL LOW (ref >60)
Glucose, Bld: 137 mg/dL — ABNORMAL HIGH (ref 70–99)
Potassium: 4.4 mmol/L (ref 3.5–5.1)
SODIUM: 142 mmol/L (ref 135–145)

## 2017-08-10 LAB — ECHOCARDIOGRAM COMPLETE
Height: 71 in
WEIGHTICAEL: 3552.05 [oz_av]

## 2017-08-10 NOTE — Discharge Instructions (Signed)
Pt will follow up with the [facility] MD on her appt

## 2017-08-10 NOTE — Evaluation (Addendum)
Physical Therapy Evaluation Patient Details Name: Cynthia [name] MRN: [id number] DOB: 11/12/1940 Today's Date: 08/10/2017   History of Present Illness  77 y.o. female with a known history of COPD with ongoing tobacco abuse has chronic oxygen at home uses erratically, coronary artery disease is post CABG, hypertension hyperlipidemia comes to the emergency room after brought in by husband with lithology.  Clinical Impression  Patient alert at start of session, oriented to self and situation, reports headache pain of 9/10, nursing had administered medication prior to session. Per family and patient, patient lives in first floor apartment with husband, with 4 STE. Ambulates intermittently with RW for household distances, has HH aide that comes 2x a day 5x a week to assist with ADLs. The patient mobilized to EOB with supervision, transferred with supervision, and ambulated ~29200ft with RW, CGA/supervision and 2L O2 via [place]. Patient also demonstrated commode transfer with supervision and grab bars, minAx1 for toileting activities.  Vitals stable throughout, patient complained of minimal fatigue. The patient demonstrates return to baseline function, no further acute PT needs.     Follow Up Recommendations No PT follow up    Equipment Recommendations  None recommended by PT    Recommendations for Other Services       Precautions / Restrictions Precautions Precautions: Fall Restrictions Weight Bearing Restrictions: No      Mobility  Bed Mobility Overal bed mobility: Needs Assistance Bed Mobility: Supine to Sit     Supine to sit: Supervision        Transfers Overall transfer level: Needs assistance Equipment used: Rolling walker (2 wheeled) Transfers: Sit to/from Stand Sit to Stand: Supervision            Ambulation/Gait Ambulation/Gait assistance: Supervision;Min guard Gait Distance (Feet): 200 Feet Assistive device: Rolling walker (2 wheeled) Gait Pattern/deviations:  WFL(Within Functional Limits)     General Gait Details: Patient ambulates with walker forward outside base of support, able to correct with verbal cues but poor carry over.  Stairs            Wheelchair Mobility    Modified Rankin (Stroke Patients Only)       Balance Overall balance assessment: Mild deficits observed, not formally tested                                           Pertinent Vitals/Pain Pain Assessment: 0-10 Pain Score: 9  Pain Location: headache Pain Descriptors / Indicators: Grimacing Pain Intervention(s): Premedicated before session;Repositioned    Home Living Family/patient expects to be discharged to:: Private residence Living Arrangements: Spouse/significant other Available Help at Discharge: Family;Personal care attendant;Other (Comment);Available 24 hours/day(HH aide 5days/week) Type of Home: Apartment Home Access: Stairs to enter Entrance Stairs-Rails: Right;Left Entrance Stairs-Number of Steps: 4 Home Layout: One level Home Equipment: Grab bars - tub/shower;Walker - 4 wheels;Tub bench;Bedside commode      Prior Function Level of Independence: Needs assistance   Gait / Transfers Assistance Needed: Patient reports that "sometimes I use my walker, sometimes I don't". Husband reports that she gets around home pretty well, occasionally holds furniture, occasionally he walks with her.  ADL's / Homemaking Assistance Needed: HH aid who comes 5d/wk, two times/day, 2h in AM and 4 hr in PM, 6 hours on Sunday. Requires assist with ADLs/IADLs        Hand Dominance   Dominant Hand: Right    Extremity/Trunk Assessment  Upper Extremity Assessment Upper Extremity Assessment: Defer to OT evaluation;Overall [facility] for tasks assessed    Lower Extremity Assessment Lower Extremity Assessment: RLE deficits/detail;LLE deficits/detail RLE Deficits / Details: 4/5 LLE Deficits / Details: 3/5       Communication   Communication: No  difficulties  Cognition Arousal/Alertness: Awake/alert Behavior During Therapy: WFL for tasks assessed/performed                                          General Comments      Exercises Other Exercises Other Exercises: Patient ambulated to commode with RW, CGA and use of grab bars for transfers. minAx1 for toileting needs, reports help at home for bathroom activities.   Assessment/Plan    PT Assessment    PT Problem List Decreased strength;Cardiopulmonary status limiting activity;Decreased activity tolerance;Decreased knowledge of use of DME;Decreased balance;Decreased safety awareness;Decreased mobility       PT Treatment Interventions      PT Goals (Current goals can be found in the Care Plan section)       Frequency     Barriers to discharge        Co-evaluation               AM-PAC PT "6 Clicks" Daily Activity  Outcome Measure Difficulty turning over in bed (including adjusting bedclothes, sheets and blankets)?: A Little Difficulty moving from lying on back to sitting on the side of the bed? : A Little Difficulty sitting down on and standing up from a chair with arms (e.g., wheelchair, bedside commode, etc,.)?: A Little Help needed moving to and from a bed to chair (including a wheelchair)?: A Little Help needed walking in hospital room?: A Little Help needed climbing 3-5 steps with a railing? : A Little 6 Click Score: 18    End of Session Equipment Utilized During Treatment: Gait belt;Oxygen Activity Tolerance: Patient tolerated treatment well Patient left: in chair;with nursing/sitter in room;with call bell/phone within reach Nurse Communication: Mobility status PT Visit Diagnosis: Unsteadiness on feet (R26.81);Other abnormalities of gait and mobility (R26.89)    Time: 4098-1191 PT Time Calculation (min) (ACUTE ONLY): 30 min   Charges:   PT Evaluation $PT Eval Low Complexity: 1 Low PT Treatments $Therapeutic Activity: 8-22 mins    PT G Codes:        Olga Coaster PT, DPT 10:06 AM,08/10/17 [phone]

## 2017-08-10 NOTE — Discharge Summary (Signed)
Cynthia [name] at [place] NAME: Cynthia [name]    MR#:  [id number]  DATE OF BIRTH:  [birth year]  DATE OF ADMISSION:  08/09/2017 ADMITTING PHYSICIAN: Fritzi Mandes, MD  DATE OF DISCHARGE: 08/10/2017  PRIMARY CARE PHYSICIAN: System, Pcp Not In    ADMISSION DIAGNOSIS:  COPD with acute exacerbation ([place]) [J44.1] Acute on chronic congestive heart failure, unspecified heart failure type ([place]) [I50.9]  DISCHARGE DIAGNOSIS:  Acute on chronic CHF systolic Mild acute on Chronic COPD flare Chronic anxiety SECONDARY DIAGNOSIS:   Past Medical History:  Diagnosis Date  . Blood clot in vein   . Coronary artery disease   . GERD (gastroesophageal reflux disease)   . Heart attack ([place])   . Hx of CABG 2013  . Hyperlipidemia   . Hypertension     HOSPITAL COURSE:   Cynthia [name]  is a 77 y.o. female with a known history of COPD with ongoing tobacco abuse has chronic oxygen at home uses erratically, coronary artery disease is post CABG, hypertension hyperlipidemia comes to the emergency room after brought in by husband with lithology. Patient was found to be wheezing with short of breath. She was found to be in congestive heart failure with elevated BNP and COPD exacerbation.  1.Acute on chronic hypoxic respiratory failure due to COPD exacerbation acute on chronic and CHF suspected systolic with EF of 43% and on previous  Review of stress test done several years ago. -Echo shows EF 30-35% -received iv Lasix 20 mg BID--now changed to oral lasix -no resp distress or wheezing -patient received one dose of IV Solu-Medrol 125 mg. Will continue PRN nebs and inhalers -no indication for antibiotics.  2. acute on chronic COPD exacerbation with ongoing tobacco abuse -continue inhalers,  PRN nebs -smoking cessation advised -no wheezing today  3. Altered mental status with confusion Pt much calm today pter her PCP she does not do well in the hospital due to  anxiety  4. CAD status post CABG -continue home meds  5. DVT prophylaxis subcu Lovenox  Discussed with patient's PCP Dr Meredith Staggers and will d/c home and have her cont f[facility].  D/w Coralyn Mark  In the room   CONSULTS OBTAINED:    DRUG ALLERGIES:   Allergies  Allergen Reactions  . Penicillins Swelling and Other (See Comments)    Has patient had a PCN reaction causing immediate rash, facial/tongue/throat swelling, SOB or lightheadedness with hypotension: No Has patient had a PCN reaction causing severe rash involving mucus membranes or skin necrosis: No Has patient had a PCN reaction that required hospitalization: No Has patient had a PCN reaction occurring within the last 10 years: No If all of the above answers are "NO", then may proceed with Cephalosporin use.      DISCHARGE MEDICATIONS:   Allergies as of 08/10/2017      Reactions   Penicillins Swelling, Other (See Comments)   Has patient had a PCN reaction causing immediate rash, facial/tongue/throat swelling, SOB or lightheadedness with hypotension: No Has patient had a PCN reaction causing severe rash involving mucus membranes or skin necrosis: No Has patient had a PCN reaction that required hospitalization: No Has patient had a PCN reaction occurring within the last 10 years: No If all of the above answers are "NO", then may proceed with Cephalosporin use.        Medication List    STOP taking these medications   Fluticasone-Salmeterol 100-50 MCG/DOSE Aepb Commonly known as:  ADVAIR DISKUS   tiotropium 18 MCG inhalation capsule Commonly known as:  SPIRIVA     TAKE these medications   acetaminophen 650 MG CR tablet Commonly known as:  TYLENOL Take 650 mg by mouth every 12 (twelve) hours as needed for pain.   albuterol (2.5 MG/3ML) 0.083% nebulizer solution Commonly known as:  PROVENTIL Take 2.5 mg by nebulization every 4 (four) hours as needed for wheezing or shortness of breath.   aspirin EC 81  MG tablet Take 81 mg by mouth daily.   benzonatate 100 MG capsule Commonly known as:  TESSALON Take 100 mg by mouth 3 (three) times daily as needed for cough.   DESITIN 13 % Crea Generic drug:  Zinc Oxide Place 1 application into the perineum 4 (four) times daily as needed (skin protectant).   DULoxetine 60 MG capsule Commonly known as:  CYMBALTA Take 60 mg by mouth daily.   furosemide 40 MG tablet Commonly known as:  LASIX Take 40 mg by mouth every Monday, Wednesday, and Friday.   gabapentin 300 MG capsule Commonly known as:  NEURONTIN Take 300 mg by mouth at bedtime.   HYDROcodone-acetaminophen 5-325 MG tablet Commonly known as:  NORCO/VICODIN Take 1 tablet by mouth 2 (two) times daily as needed for moderate pain.   hydrocortisone cream 0.5 % Apply 1 application topically 2 (two) times daily as needed for itching.   ipratropium-albuterol 0.5-2.5 (3) MG/3ML Soln Commonly known as:  DUONEB Take 3 mLs by nebulization every 6 (six) hours.   isosorbide mononitrate 30 MG 24 hr tablet Commonly known as:  IMDUR Take 30 mg by mouth daily.   latanoprost 0.005 % ophthalmic solution Commonly known as:  XALATAN Place 1 drop into both eyes at bedtime.   LORazepam 2 MG/ML concentrated solution Commonly known as:  ATIVAN Take 0.5 mg by mouth every 2 (two) hours as needed for anxiety (agitation).   morphine CONCENTRATE 10 mg / 0.5 ml concentrated solution Place 4 mg under the tongue every 2 (two) hours as needed for severe pain.   naloxone 4 MG/0.1ML Liqd nasal spray kit Commonly known as:  NARCAN Place 1 spray into the nose once. Repeat every 3 minutes if needed for unresponsiveness   nitroGLYCERIN 0.3 MG SL tablet Commonly known as:  NITROSTAT Place 0.3 mg under the tongue every 5 (five) minutes as needed for chest pain.   QUEtiapine 25 MG tablet Commonly known as:  SEROQUEL Take 25 mg by mouth at bedtime.   salmeterol 50 MCG/DOSE diskus inhaler Commonly known as:   SEREVENT Inhale 1 puff into the lungs 2 (two) times daily.   sennosides-docusate sodium 8.6-50 MG tablet Commonly known as:  SENOKOT-S Take 1 tablet by mouth daily.   SYSTANE 0.4-0.3 % Gel ophthalmic gel Generic drug:  Polyethyl Glycol-Propyl Glycol Place 1 application into both eyes at bedtime.       If you experience worsening of your admission symptoms, develop shortness of breath, life threatening emergency, suicidal or homicidal thoughts you must seek medical attention immediately by calling 911 or calling your MD immediately  if symptoms less severe.  You Must read complete instructions/literature along with all the possible adverse reactions/side effects for all the Medicines you take and that have been prescribed to you. Take any new Medicines after you have completely understood and accept all the possible adverse reactions/side effects.   Please note  You were cared for by a hospitalist during your hospital stay. If you have any questions about your discharge  medications or the care you received while you were in the hospital after you are discharged, you can call the unit and asked to speak with the hospitalist on call if the hospitalist that took care of you is not available. Once you are discharged, your primary care physician will handle any further medical issues. Please note that NO REFILLS for any discharge medications will be authorized once you are discharged, as it is imperative that you return to your primary care physician (or establish a relationship with a primary care physician if you do not have one) for your aftercare needs so that they can reassess your need for medications and monitor your lab values. Today   SUBJECTIVE   Out in the chair. In good spirits. No complaints husband in the room  VITAL SIGNS:  Blood pressure (!) 152/92, pulse 90, temperature 98.1 F (36.7 C), temperature source Oral, resp. rate 18, height '5\' 11"'  (1.803 m), weight 100.7 kg (222 lb  0.1 oz), SpO2 97 %.  I/O:    Intake/Output Summary (Last 24 hours) at 08/10/2017 1122 Last data filed at 08/10/2017 1004 Gross per 24 hour  Intake 365 ml  Output -  Net 365 ml    PHYSICAL EXAMINATION:  GENERAL:  77 y.o.-year-old patient lying in the bed with no acute distress. obese EYES: Pupils equal, round, reactive to light and accommodation. No scleral icterus. Extraocular muscles intact.  HEENT: Head atraumatic, normocephalic. Oropharynx and nasopharynx clear.  NECK:  Supple, no jugular venous distention. No thyroid enlargement, no tenderness.  LUNGS: Normal breath sounds bilaterally, no wheezing, rales,rhonchi or crepitation. No use of accessory muscles of respiration.  CARDIOVASCULAR: S1, S2 normal. No murmurs, rubs, or gallops.  ABDOMEN: Soft, non-tender, non-distended. Bowel sounds present. No organomegaly or mass.  EXTREMITIES: No pedal edema, cyanosis, or clubbing.  NEUROLOGIC: Cranial nerves II through XII are intact. Muscle strength 5/5 in all extremities. Sensation intact. Gait not checked.  PSYCHIATRIC: The patient is alert and awakeSKIN: No obvious rash, lesion, or ulcer.   DATA REVIEW:   CBC  Recent Labs  Lab 08/09/17 0813  WBC 2.5*  HGB 12.9  HCT 38.4  PLT 144*    Chemistries  Recent Labs  Lab 08/10/17 0422  NA 142  K 4.4  CL 106  CO2 27  GLUCOSE 137*  BUN 18  CREATININE 1.02*  CALCIUM 9.2    Microbiology Results   No results found for this or any previous visit (from the past 240 hour(s)).  RADIOLOGY:  Dg Chest 2 View  Result Date: 08/09/2017 CLINICAL DATA:  Cough and wheezing EXAM: CHEST - 2 VIEW COMPARISON:  July 05, 2017 chest radiograph and chest CT Jun 10, 2015 FINDINGS: There is mild chronic interstitial thickening. There is no frank edema or consolidation. There is cardiomegaly with pulmonary vascularity within normal limits. Patient is status post coronary artery bypass grafting. There is aortic atherosclerosis. No adenopathy. There  is postoperative change in the left neck region. There is degenerative change in each shoulder. IMPRESSION: Stable cardiomegaly. Aortic atherosclerosis. Chronic interstitial thickening without frank edema or consolidation evident. Aortic Atherosclerosis (ICD10-I70.0). Electronically Signed   By: Lowella Grip III M.D.   On: 08/09/2017 09:02     Management plans discussed with the patient, family and they are in agreement.  CODE STATUS:     Code Status Orders  (From admission, onward)        Start     Ordered   08/09/17 1453  Full code  Continuous  08/09/17 1452    Code Status History    Date Active Date Inactive Code Status Order ID Comments User Context   07/05/2017 0250 07/06/2017 1714 Full Code [id number]  Arta Silence, MD Inpatient   03/26/2017 2049 03/28/2017 1546 Full Code [id number]  Loletha Grayer, MD ED   04/30/2016 0133 05/01/2016 1526 Full Code [id number]  Lance Coon, MD ED   09/08/2014 1719 09/09/2014 2011 Full Code [id number]  Demetrios Loll, MD Inpatient      TOTAL TIME TAKING CARE OF THIS PATIENT: *40* minutes.    Fritzi Mandes M.D on 08/10/2017 at 11:22 AM  Between 7am to 6pm - Pager - [phone] After 6pm go to [email or web address] - password [email or web address] [facility]  Office  [phone]  CC: Primary care physician; System, Pcp Not In

## 2017-08-10 NOTE — Progress Notes (Signed)
Patient ready for discharge.  D/C telemetry and PIV.  No new medications or any questions at this time. Cynthia [name] from [facility] will take patient home.  [phone]).  She will be escorted out of hospital via wheelchair by volunteers.

## 2017-08-10 NOTE — Clinical Social Work Note (Addendum)
CSW spoke to [employer] [phone], she asked to be called when patient is ready for discharge and they will pick her up.  CSW updated bedside nurse, Arita Missace also requested that CSW fax discharge summary once it has been completed to [phone].   PT saw patient and are recommending no PT follow up, CSW to sign off.  Ervin KnackEric R. Clarkson Rosselli, MSW, Theresia MajorsLCSWA [phone]  08/10/2017 10:27 AM

## 2017-08-14 LAB — BLOOD GAS, VENOUS
ACID-BASE EXCESS: 2.8 mmol/L — AB (ref 0.0–2.0)
Bicarbonate: 30.1 mmol/L — ABNORMAL HIGH (ref 20.0–28.0)
O2 Saturation: 59.6 %
PH VEN: 7.33 (ref 7.250–7.430)
Patient temperature: 37
pCO2, Ven: 57 mmHg (ref 44.0–60.0)

## 2017-08-21 ENCOUNTER — Telehealth: Payer: Self-pay | Admitting: Family

## 2017-08-21 ENCOUNTER — Ambulatory Visit: Payer: Medicare (Managed Care) | Admitting: Family

## 2017-08-21 NOTE — Progress Notes (Deleted)
   Patient ID: Marliss CootsIris Whitfield Tuckerman, female    DOB: 01/11/1941, 77 y.o.   MRN: [id number]  HPI  Ms Allison QuarryCobb is a 77 y/o female with a history of   Echo report from 08/09/17 reviewed and showed an EF of 30-35% along with mild AS and moderate MR.   Admitted 08/09/17 due to acute on chronic HF and mild COPD flare. Initially needed IV lasix and then transitioned to oral diuretics. Gave one dose of IV solu-medrol. Discharged the following day. Admitted 07/05/17 due to chest pain. Troponins were negative. Cardiology consult obtained. Discharged the following day.   She presents today for her initial visit with a chief complaint of  Currently being followed [facility].  Review of Systems    Physical Exam    Assessment & Plan:  1: Chronic heart failure with reduced ejection fraction- - NYHA class - BNP 08/09/17 was 530.0  2: HTN- - BMP from 08/10/17 reviewed and showed sodium 142, potassium 4.4 and GFR >60

## 2017-08-21 NOTE — Telephone Encounter (Signed)
Patient missed her initial appointment [facility] 08/21/[facility]. Will attempt to reschedule.

## 2017-09-11 ENCOUNTER — Other Ambulatory Visit: Payer: Self-pay | Admitting: Family Medicine

## 2017-09-11 ENCOUNTER — Ambulatory Visit
Admission: RE | Admit: 2017-09-11 | Discharge: 2017-09-11 | Disposition: A | Discharge status: Home / Self Care | Payer: Medicare (Managed Care) | Source: Ambulatory Visit | Attending: Family Medicine | Admitting: Family Medicine

## 2017-09-11 DIAGNOSIS — R2242 Localized swelling, mass and lump, left lower limb: Secondary | ICD-10-CM | POA: Insufficient documentation

## 2017-09-11 DIAGNOSIS — M79605 Pain in left leg: Secondary | ICD-10-CM

## 2017-09-11 DIAGNOSIS — R609 Edema, unspecified: Secondary | ICD-10-CM

## 2017-11-14 ENCOUNTER — Emergency Department: Payer: Medicare (Managed Care)

## 2017-11-14 ENCOUNTER — Other Ambulatory Visit: Payer: Self-pay

## 2017-11-14 ENCOUNTER — Inpatient Hospital Stay
Admission: EM | Admit: 2017-11-14 | Discharge: 2017-11-17 | DRG: 871 | Disposition: A | Discharge status: Skilled Nursing Facility | Payer: Medicare (Managed Care) | Attending: Internal Medicine | Admitting: Internal Medicine

## 2017-11-14 ENCOUNTER — Inpatient Hospital Stay: Payer: Medicare (Managed Care)

## 2017-11-14 DIAGNOSIS — F17210 Nicotine dependence, cigarettes, uncomplicated: Secondary | ICD-10-CM | POA: Diagnosis present

## 2017-11-14 DIAGNOSIS — E785 Hyperlipidemia, unspecified: Secondary | ICD-10-CM | POA: Diagnosis present

## 2017-11-14 DIAGNOSIS — Z79899 Other long term (current) drug therapy: Secondary | ICD-10-CM | POA: Diagnosis not present

## 2017-11-14 DIAGNOSIS — A419 Sepsis, unspecified organism: Secondary | ICD-10-CM | POA: Diagnosis present

## 2017-11-14 DIAGNOSIS — F0390 Unspecified dementia without behavioral disturbance: Secondary | ICD-10-CM | POA: Diagnosis present

## 2017-11-14 DIAGNOSIS — Z951 Presence of aortocoronary bypass graft: Secondary | ICD-10-CM

## 2017-11-14 DIAGNOSIS — E876 Hypokalemia: Secondary | ICD-10-CM | POA: Diagnosis present

## 2017-11-14 DIAGNOSIS — Z880 Allergy status to penicillin: Secondary | ICD-10-CM | POA: Diagnosis not present

## 2017-11-14 DIAGNOSIS — A4151 Sepsis due to Escherichia coli [E. coli]: Principal | ICD-10-CM | POA: Diagnosis present

## 2017-11-14 DIAGNOSIS — I5022 Chronic systolic (congestive) heart failure: Secondary | ICD-10-CM | POA: Diagnosis present

## 2017-11-14 DIAGNOSIS — G9341 Metabolic encephalopathy: Secondary | ICD-10-CM | POA: Diagnosis present

## 2017-11-14 DIAGNOSIS — I2510 Atherosclerotic heart disease of native coronary artery without angina pectoris: Secondary | ICD-10-CM | POA: Diagnosis present

## 2017-11-14 DIAGNOSIS — H409 Unspecified glaucoma: Secondary | ICD-10-CM | POA: Diagnosis present

## 2017-11-14 DIAGNOSIS — R0902 Hypoxemia: Secondary | ICD-10-CM | POA: Diagnosis present

## 2017-11-14 DIAGNOSIS — N179 Acute kidney failure, unspecified: Secondary | ICD-10-CM | POA: Diagnosis present

## 2017-11-14 DIAGNOSIS — I110 Hypertensive heart disease with heart failure: Secondary | ICD-10-CM | POA: Diagnosis present

## 2017-11-14 DIAGNOSIS — I252 Old myocardial infarction: Secondary | ICD-10-CM

## 2017-11-14 DIAGNOSIS — Z7982 Long term (current) use of aspirin: Secondary | ICD-10-CM | POA: Diagnosis not present

## 2017-11-14 DIAGNOSIS — K219 Gastro-esophageal reflux disease without esophagitis: Secondary | ICD-10-CM | POA: Diagnosis present

## 2017-11-14 DIAGNOSIS — K8020 Calculus of gallbladder without cholecystitis without obstruction: Secondary | ICD-10-CM | POA: Diagnosis present

## 2017-11-14 DIAGNOSIS — Z66 Do not resuscitate: Secondary | ICD-10-CM | POA: Diagnosis present

## 2017-11-14 DIAGNOSIS — N390 Urinary tract infection, site not specified: Secondary | ICD-10-CM | POA: Diagnosis present

## 2017-11-14 DIAGNOSIS — R062 Wheezing: Secondary | ICD-10-CM

## 2017-11-14 DIAGNOSIS — K7460 Unspecified cirrhosis of liver: Secondary | ICD-10-CM | POA: Diagnosis present

## 2017-11-14 LAB — CBC WITH DIFFERENTIAL/PLATELET
Abs Immature Granulocytes: 0.04 10*3/uL (ref 0.00–0.07)
BASOS ABS: 0 10*3/uL (ref 0.0–0.1)
BASOS PCT: 0 %
EOS ABS: 0.1 10*3/uL (ref 0.0–0.5)
Eosinophils Relative: 1 %
HCT: 37.7 % (ref 36.0–46.0)
Hemoglobin: 12.3 g/dL (ref 12.0–15.0)
IMMATURE GRANULOCYTES: 0 %
LYMPHS ABS: 0.3 10*3/uL — AB (ref 0.7–4.0)
LYMPHS PCT: 3 %
MCH: 32.6 pg (ref 26.0–34.0)
MCHC: 32.6 g/dL (ref 30.0–36.0)
MCV: 100 fL (ref 80.0–100.0)
MONO ABS: 0.9 10*3/uL (ref 0.1–1.0)
MONOS PCT: 10 %
NEUTROS PCT: 86 %
Neutro Abs: 8.3 10*3/uL — ABNORMAL HIGH (ref 1.7–7.7)
PLATELETS: 115 10*3/uL — AB (ref 150–400)
RBC: 3.77 MIL/uL — ABNORMAL LOW (ref 3.87–5.11)
RDW: 14.1 % (ref 11.5–15.5)
WBC: 9.7 10*3/uL (ref 4.0–10.5)
nRBC: 0 % (ref 0.0–0.2)

## 2017-11-14 LAB — URINALYSIS, ROUTINE W REFLEX MICROSCOPIC
BILIRUBIN URINE: NEGATIVE
Glucose, UA: NEGATIVE mg/dL
KETONES UR: NEGATIVE mg/dL
NITRITE: NEGATIVE
PROTEIN: 100 mg/dL — AB
Specific Gravity, Urine: 1.012 (ref 1.005–1.030)
Squamous Epithelial / LPF: NONE SEEN (ref 0–5)
pH: 5 (ref 5.0–8.0)

## 2017-11-14 LAB — COMPREHENSIVE METABOLIC PANEL
ALT: 13 U/L (ref 0–44)
ANION GAP: 11 (ref 5–15)
AST: 20 U/L (ref 15–41)
Albumin: 3.8 g/dL (ref 3.5–5.0)
Alkaline Phosphatase: 132 U/L — ABNORMAL HIGH (ref 38–126)
BILIRUBIN TOTAL: 3.4 mg/dL — AB (ref 0.3–1.2)
BUN: 31 mg/dL — AB (ref 8–23)
CHLORIDE: 102 mmol/L (ref 98–111)
CO2: 26 mmol/L (ref 22–32)
Calcium: 9.2 mg/dL (ref 8.9–10.3)
Creatinine, Ser: 1.88 mg/dL — ABNORMAL HIGH (ref 0.44–1.00)
GFR calc Af Amer: 29 mL/min — ABNORMAL LOW (ref >60)
GFR calc non Af Amer: 25 mL/min — ABNORMAL LOW (ref >60)
Glucose, Bld: 103 mg/dL — ABNORMAL HIGH (ref 70–99)
POTASSIUM: 3.8 mmol/L (ref 3.5–5.1)
SODIUM: 139 mmol/L (ref 135–145)
Total Protein: 7.3 g/dL (ref 6.5–8.1)

## 2017-11-14 LAB — AMMONIA: AMMONIA: 29 umol/L (ref 9–35)

## 2017-11-14 LAB — LACTIC ACID, PLASMA
LACTIC ACID, VENOUS: 1.1 mmol/L (ref 0.5–1.9)
Lactic Acid, Venous: 1.9 mmol/L (ref 0.5–1.9)

## 2017-11-14 LAB — BILIRUBIN, FRACTIONATED(TOT/DIR/INDIR)
BILIRUBIN INDIRECT: 1.2 mg/dL — AB (ref 0.3–0.9)
Bilirubin, Direct: 1.2 mg/dL — ABNORMAL HIGH (ref 0.0–0.2)
Total Bilirubin: 2.4 mg/dL — ABNORMAL HIGH (ref 0.3–1.2)

## 2017-11-14 LAB — TSH: TSH: 0.334 u[IU]/mL — ABNORMAL LOW (ref 0.350–4.500)

## 2017-11-14 LAB — TROPONIN I: TROPONIN I: 0.06 ng/mL — AB (ref <0.03)

## 2017-11-14 LAB — MRSA PCR SCREENING: MRSA BY PCR: POSITIVE — AB

## 2017-11-14 MED ORDER — ISOSORBIDE MONONITRATE ER 30 MG PO TB24
30.0000 mg | ORAL_TABLET | Freq: Every day | ORAL | Status: DC
  Administered 2017-11-15 – 2017-11-17 (×3): 30 mg via ORAL
  Filled 2017-11-14 (×4): qty 1

## 2017-11-14 MED ORDER — NITROGLYCERIN 0.4 MG SL SUBL: 0.4000 mg | SUBLINGUAL_TABLET | SUBLINGUAL | Status: DC | PRN

## 2017-11-14 MED ORDER — SODIUM CHLORIDE 0.9 % IV SOLN
2.0000 g | Freq: Once | INTRAVENOUS | Status: AC
  Administered 2017-11-14: 2 g via INTRAVENOUS
  Filled 2017-11-14: qty 2

## 2017-11-14 MED ORDER — ONDANSETRON HCL 4 MG PO TABS: 4.0000 mg | ORAL_TABLET | Freq: Four times a day (QID) | ORAL | Status: DC | PRN

## 2017-11-14 MED ORDER — SODIUM CHLORIDE 0.9 % IV SOLN
Freq: Once | INTRAVENOUS | Status: AC
  Administered 2017-11-14: 13:00:00 via INTRAVENOUS

## 2017-11-14 MED ORDER — ONDANSETRON HCL 4 MG/2ML IJ SOLN
4.0000 mg | Freq: Four times a day (QID) | INTRAMUSCULAR | Status: DC | PRN
  Administered 2017-11-15: 4 mg via INTRAVENOUS
  Filled 2017-11-14: qty 2

## 2017-11-14 MED ORDER — METRONIDAZOLE IN NACL 5-0.79 MG/ML-% IV SOLN
500.0000 mg | Freq: Three times a day (TID) | INTRAVENOUS | Status: DC
  Administered 2017-11-14 (×2): 500 mg via INTRAVENOUS
  Filled 2017-11-14 (×4): qty 100

## 2017-11-14 MED ORDER — BENZONATATE 100 MG PO CAPS: 100.0000 mg | ORAL_CAPSULE | Freq: Three times a day (TID) | ORAL | Status: DC | PRN

## 2017-11-14 MED ORDER — ACETAMINOPHEN 650 MG RE SUPP: 650.0000 mg | Freq: Four times a day (QID) | RECTAL | Status: DC | PRN

## 2017-11-14 MED ORDER — ACETAMINOPHEN 325 MG PO TABS
650.0000 mg | ORAL_TABLET | Freq: Four times a day (QID) | ORAL | Status: DC | PRN
  Administered 2017-11-14 – 2017-11-16 (×3): 650 mg via ORAL
  Filled 2017-11-14 (×4): qty 2

## 2017-11-14 MED ORDER — HALOPERIDOL LACTATE 5 MG/ML IJ SOLN
5.0000 mg | Freq: Four times a day (QID) | INTRAMUSCULAR | Status: DC | PRN
  Filled 2017-11-14: qty 1

## 2017-11-14 MED ORDER — LORAZEPAM 2 MG/ML IJ SOLN
INTRAMUSCULAR | Status: AC
  Filled 2017-11-14: qty 1

## 2017-11-14 MED ORDER — SODIUM CHLORIDE 0.9 % IV SOLN
2.0000 g | Freq: Three times a day (TID) | INTRAVENOUS | Status: DC
  Administered 2017-11-14: 2 g via INTRAVENOUS
  Filled 2017-11-14 (×3): qty 2

## 2017-11-14 MED ORDER — ALBUTEROL SULFATE (2.5 MG/3ML) 0.083% IN NEBU
5.0000 mg | INHALATION_SOLUTION | Freq: Once | RESPIRATORY_TRACT | Status: AC
  Administered 2017-11-14: 5 mg via RESPIRATORY_TRACT
  Filled 2017-11-14: qty 6

## 2017-11-14 MED ORDER — LATANOPROST 0.005 % OP SOLN
1.0000 [drp] | Freq: Every day | OPHTHALMIC | Status: DC
  Administered 2017-11-14 – 2017-11-16 (×3): 1 [drp] via OPHTHALMIC
  Filled 2017-11-14: qty 2.5

## 2017-11-14 MED ORDER — VANCOMYCIN HCL IN DEXTROSE 1-5 GM/200ML-% IV SOLN
1000.0000 mg | Freq: Once | INTRAVENOUS | Status: AC
  Administered 2017-11-14: 1000 mg via INTRAVENOUS
  Filled 2017-11-14: qty 200

## 2017-11-14 MED ORDER — AZTREONAM 2 G IJ SOLR: 2.0000 g | Freq: Three times a day (TID) | INTRAMUSCULAR | Status: DC

## 2017-11-14 MED ORDER — SODIUM CHLORIDE 0.9 % IV SOLN
1.0000 g | INTRAVENOUS | Status: DC
  Administered 2017-11-14: 1 g via INTRAVENOUS
  Filled 2017-11-14: qty 1

## 2017-11-14 MED ORDER — LORAZEPAM 2 MG/ML IJ SOLN
1.0000 mg | INTRAMUSCULAR | Status: DC | PRN
  Administered 2017-11-16: 1 mg via INTRAVENOUS
  Filled 2017-11-14: qty 1

## 2017-11-14 MED ORDER — PROMETHAZINE HCL 25 MG/ML IJ SOLN: 12.5000 mg | Freq: Once | INTRAMUSCULAR | Status: DC

## 2017-11-14 MED ORDER — ARFORMOTEROL TARTRATE 15 MCG/2ML IN NEBU
15.0000 ug | INHALATION_SOLUTION | Freq: Two times a day (BID) | RESPIRATORY_TRACT | Status: DC
  Administered 2017-11-14 – 2017-11-17 (×6): 15 ug via RESPIRATORY_TRACT
  Filled 2017-11-14 (×7): qty 2

## 2017-11-14 MED ORDER — VANCOMYCIN HCL 10 G IV SOLR
1250.0000 mg | INTRAVENOUS | Status: DC
  Administered 2017-11-14: 1250 mg via INTRAVENOUS
  Filled 2017-11-14: qty 1250

## 2017-11-14 MED ORDER — POLYETHYLENE GLYCOL 3350 17 G PO PACK: 17.0000 g | PACK | Freq: Every day | ORAL | Status: DC | PRN

## 2017-11-14 MED ORDER — SENNOSIDES-DOCUSATE SODIUM 8.6-50 MG PO TABS
1.0000 | ORAL_TABLET | Freq: Every day | ORAL | Status: DC
  Administered 2017-11-17: 1 via ORAL
  Filled 2017-11-14: qty 1

## 2017-11-14 MED ORDER — POTASSIUM CHLORIDE CRYS ER 20 MEQ PO TBCR
10.0000 meq | EXTENDED_RELEASE_TABLET | Freq: Every day | ORAL | Status: DC
  Administered 2017-11-14 – 2017-11-17 (×4): 10 meq via ORAL
  Filled 2017-11-14 (×4): qty 1

## 2017-11-14 MED ORDER — LORAZEPAM 2 MG/ML IJ SOLN
1.0000 mg | Freq: Once | INTRAMUSCULAR | Status: AC
  Administered 2017-11-14: 1 mg via INTRAVENOUS

## 2017-11-14 MED ORDER — SODIUM CHLORIDE 0.9 % IV SOLN
INTRAVENOUS | Status: DC
  Administered 2017-11-14: 19:00:00 via INTRAVENOUS

## 2017-11-14 MED ORDER — ASPIRIN EC 81 MG PO TBEC
81.0000 mg | DELAYED_RELEASE_TABLET | Freq: Every day | ORAL | Status: DC
  Administered 2017-11-14 – 2017-11-17 (×4): 81 mg via ORAL
  Filled 2017-11-14 (×5): qty 1

## 2017-11-14 MED ORDER — HEPARIN SODIUM (PORCINE) 5000 UNIT/ML IJ SOLN
5000.0000 [IU] | Freq: Three times a day (TID) | INTRAMUSCULAR | Status: DC
  Administered 2017-11-14 – 2017-11-16 (×5): 5000 [IU] via SUBCUTANEOUS
  Filled 2017-11-14 (×7): qty 1

## 2017-11-14 MED ORDER — ACETAMINOPHEN 500 MG PO TABS
1000.0000 mg | ORAL_TABLET | Freq: Once | ORAL | Status: AC
  Administered 2017-11-14: 1000 mg via ORAL
  Filled 2017-11-14: qty 2

## 2017-11-14 MED ORDER — IPRATROPIUM-ALBUTEROL 0.5-2.5 (3) MG/3ML IN SOLN
3.0000 mL | Freq: Four times a day (QID) | RESPIRATORY_TRACT | Status: DC | PRN
  Administered 2017-11-14 – 2017-11-17 (×9): 3 mL via RESPIRATORY_TRACT
  Filled 2017-11-14 (×9): qty 3

## 2017-11-14 MED ORDER — IPRATROPIUM-ALBUTEROL 0.5-2.5 (3) MG/3ML IN SOLN
3.0000 mL | Freq: Once | RESPIRATORY_TRACT | Status: AC
  Administered 2017-11-16: 3 mL via RESPIRATORY_TRACT

## 2017-11-14 MED ORDER — IPRATROPIUM-ALBUTEROL 0.5-2.5 (3) MG/3ML IN SOLN
3.0000 mL | Freq: Once | RESPIRATORY_TRACT | Status: AC
  Administered 2017-11-14: 3 mL via RESPIRATORY_TRACT
  Filled 2017-11-14: qty 3

## 2017-11-14 NOTE — ED Notes (Signed)
Tried twice to give pt albuterol treatment.  Pt will not keep mask on face.  Probably only received 1/2 of dose.  Sats 99%

## 2017-11-14 NOTE — ED Triage Notes (Signed)
Per EMS, pt came from home.  Home health nurse visited this AM.  O2 sats in 80s.  Pt had one duoneb at home with no changes.  Unable to get to MD office so EMS called.  Put on 2L O2, sats 95-96%.  Additional duoneb given enroute to hospital.  Noted wheezing in left lobe.  Vitals:  125/71, 102.1 (O).

## 2017-11-14 NOTE — ED Notes (Signed)
Dr. Sheela Stack at bedside with family discussing plan of care.

## 2017-11-14 NOTE — H&P (Signed)
[employer] [facility] [place] [facility]   PATIENT NAME: Cynthia [name]    MR#:  [id number]  DATE OF BIRTH:  [birth year]  DATE OF ADMISSION:  11/14/2017  PRIMARY CARE PHYSICIAN: System, Pcp Not In   REQUESTING/REFERRING PHYSICIAN:   CHIEF COMPLAINT:   Chief Complaint  Patient presents with  . Shortness of Breath    HISTORY OF PRESENT ILLNESS: Cynthia [name]  is a 77 y.o. female with a known history per below which includes chronic dementia, presenting to the emergency room with O2 saturation in the 80s at home, patient was noted to be lethargic, patient is poor historian due to chronic dementia, family at the bedside, patient noted to be tachycardic, tachypneic, creatinine 1.8 with baseline normal, uric acid level normal, ultrasound of the abdomen noted for increased common bile duct 1.6 cm/cirrhosis/sludge and stones within the gallbladder, ED attending did discuss case with Dr. Anna/gastroenterology-MRCP recommended, patient evaluated in the emergency room, family at the bedside as well as husband, patient is now been admitted for acute sepsis secondary to acute urinary tract infection, and acute abnormal ultrasound of the abdomen noted for common bile duct dilatation/cirrhosis/with gallstones.   PAST MEDICAL HISTORY:   Past Medical History:  Diagnosis Date  . Blood clot in vein   . Coronary artery disease   . GERD (gastroesophageal reflux disease)   . Heart attack (HCC)   . Hx of CABG 2013  . Hyperlipidemia   . Hypertension     PAST SURGICAL HISTORY:  Past Surgical History:  Procedure Laterality Date  . CARDIAC SURGERY    . HIP SURGERY Left 12/2014  . SHOULDER SURGERY Bilateral     SOCIAL HISTORY:  Social History   Tobacco Use  . Smoking status: Current Every Day Smoker    Packs/day: 0.10    Years: 35.00    Pack years: 3.50    Types: Cigarettes  . Smokeless tobacco: Never Used  Substance Use Topics  . Alcohol use: No    FAMILY HISTORY:  Family History   Problem Relation Age of Onset  . Hypertension Other   . Alcohol abuse Other     DRUG ALLERGIES:  Allergies  Allergen Reactions  . Penicillins Swelling and Other (See Comments)    Has patient had a PCN reaction causing immediate rash, facial/tongue/throat swelling, SOB or lightheadedness with hypotension: No Has patient had a PCN reaction causing severe rash involving mucus membranes or skin necrosis: No Has patient had a PCN reaction that required hospitalization: No Has patient had a PCN reaction occurring within the last 10 years: No If all of the above answers are "NO", then may proceed with Cephalosporin use.      REVIEW OF SYSTEMS: Unable to be obtained given dementia, family present  CONSTITUTIONAL: No fever, fatigue or weakness.  EYES: No blurred or double vision.  EARS, NOSE, AND THROAT: No tinnitus or ear pain.  RESPIRATORY: No cough, shortness of breath, wheezing or hemoptysis.  CARDIOVASCULAR: No chest pain, orthopnea, edema.  GASTROINTESTINAL: No nausea, vomiting, diarrhea or abdominal pain.  GENITOURINARY: No dysuria, hematuria.  ENDOCRINE: No polyuria, nocturia,  HEMATOLOGY: No anemia, easy bruising or bleeding SKIN: No rash or lesion. MUSCULOSKELETAL: No joint pain or arthritis.   NEUROLOGIC: No tingling, numbness, weakness.  PSYCHIATRY: No anxiety or depression.   MEDICATIONS AT HOME:  Prior to Admission medications   Medication Sig Start Date End Date Taking? Authorizing Provider  aspirin EC 81 MG tablet Take 81 mg by mouth daily.  Yes [provider]  benzonatate (TESSALON) 100 MG capsule Take 100 mg by mouth 3 (three) times daily as needed for cough.   Yes [provider]  furosemide (LASIX) 80 MG tablet Take 80 mg by mouth daily.   Yes [provider]  HYDROcodone-acetaminophen (NORCO/VICODIN) 5-325 MG tablet Take 1 tablet by mouth 2 (two) times daily as needed for moderate pain.   Yes [provider]   ipratropium-albuterol (DUONEB) 0.5-2.5 (3) MG/3ML SOLN Take 3 mLs by nebulization every 6 (six) hours as needed (for shortness of breath/wheezing).    Yes [provider]  isosorbide mononitrate (IMDUR) 30 MG 24 hr tablet Take 30 mg by mouth daily.   Yes [provider]  latanoprost (XALATAN) 0.005 % ophthalmic solution Place 1 drop into both eyes at bedtime.   Yes [provider]  LORazepam (ATIVAN) 2 MG/ML concentrated solution Take 0.5 mg by mouth every 2 (two) hours as needed for anxiety (agitation).   Yes [provider]  Morphine Sulfate (MORPHINE CONCENTRATE) 10 mg / 0.5 ml concentrated solution Place 4 mg under the tongue every 2 (two) hours as needed for severe pain.   Yes [provider]  nitroGLYCERIN (NITROSTAT) 0.4 MG SL tablet Place 0.4 mg under the tongue every 5 (five) minutes as needed for chest pain.   Yes [provider]  potassium chloride (K-DUR,KLOR-CON) 10 MEQ tablet Take 10 mEq by mouth daily.   Yes [provider]  salmeterol (SEREVENT) 50 MCG/DOSE diskus inhaler Inhale 1 puff into the lungs 2 (two) times daily.   Yes [provider]  sennosides-docusate sodium (SENOKOT-S) 8.6-50 MG tablet Take 1 tablet by mouth daily.   Yes [provider]      PHYSICAL EXAMINATION:   VITAL SIGNS: Blood pressure 123/80, pulse 75, temperature 99.1 F (37.3 C), temperature source Oral, resp. rate (!) 26, height 6' (1.829 m), weight 92.5 kg, SpO2 97 %.  GENERAL:  77 y.o.-year-old patient lying in the bed with no acute distress.  Frail appearing, nontoxic appearing EYES: Pupils equal, round, reactive to light and accommodation. No scleral icterus. Extraocular muscles intact.  HEENT: Head atraumatic, normocephalic. Oropharynx and nasopharynx clear.  NECK:  Supple, no jugular venous distention. No thyroid enlargement, no tenderness.  LUNGS: Normal breath sounds bilaterally, no wheezing, rales,rhonchi or  crepitation. No use of accessory muscles of respiration.  CARDIOVASCULAR: S1, S2 normal. No murmurs, rubs, or gallops.  ABDOMEN: Soft, nontender, nondistended. Bowel sounds present. No organomegaly or mass.  EXTREMITIES: No pedal edema, cyanosis, or clubbing.  NEUROLOGIC: Cranial nerves II through XII are intact. MMAES. Gait not checked.  PSYCHIATRIC: The patient is obtunded, will open eyes, mumble a few sounds, drifts off to sleep SKIN: No obvious rash, lesion, or ulcer.   LABORATORY PANEL:   CBC Recent Labs  Lab 11/14/17 1144  WBC 9.7  HGB 12.3  HCT 37.7  PLT 115*  MCV 100.0  MCH 32.6  MCHC 32.6  RDW 14.1  LYMPHSABS 0.3*  MONOABS 0.9  EOSABS 0.1  BASOSABS 0.0   ------------------------------------------------------------------------------------------------------------------  Chemistries  Recent Labs  Lab 11/14/17 1144  NA 139  K 3.8  CL 102  CO2 26  GLUCOSE 103*  BUN 31*  CREATININE 1.88*  CALCIUM 9.2  AST 20  ALT 13  ALKPHOS 132*  BILITOT 3.4*   ------------------------------------------------------------------------------------------------------------------ estimated creatinine clearance is 32.5 mL/min (A) (by C-G formula based on SCr of 1.88 mg/dL (H)). ------------------------------------------------------------------------------------------------------------------ No results for input(s): TSH, T4TOTAL, T3FREE, THYROIDAB  in the last 72 hours.  Invalid input(s): FREET3   Coagulation profile No results for input(s): INR, PROTIME in the last 168 hours. ------------------------------------------------------------------------------------------------------------------- No results for input(s): DDIMER in the last 72 hours. -------------------------------------------------------------------------------------------------------------------  Cardiac Enzymes No results for input(s): CKMB, TROPONINI, MYOGLOBIN in the last 168 hours.  Invalid input(s):  CK ------------------------------------------------------------------------------------------------------------------ Invalid input(s): POCBNP  ---------------------------------------------------------------------------------------------------------------  Urinalysis    Component Value Date/Time   COLORURINE YELLOW (A) 11/14/2017 1133   APPEARANCEUR TURBID (A) 11/14/2017 1133   APPEARANCEUR Hazy 06/08/2013 0721   LABSPEC 1.012 11/14/2017 1133   LABSPEC 1.024 06/08/2013 0721   PHURINE 5.0 11/14/2017 1133   GLUCOSEU NEGATIVE 11/14/2017 1133   GLUCOSEU Negative 06/08/2013 0721   HGBUR SMALL (A) 11/14/2017 1133   BILIRUBINUR NEGATIVE 11/14/2017 1133   BILIRUBINUR Negative 06/08/2013 0721   KETONESUR NEGATIVE 11/14/2017 1133   PROTEINUR 100 (A) 11/14/2017 1133   NITRITE NEGATIVE 11/14/2017 1133   LEUKOCYTESUR MODERATE (A) 11/14/2017 1133   LEUKOCYTESUR 3+ 06/08/2013 0721     RADIOLOGY: Dg Chest 2 View  Result Date: 11/14/2017 CLINICAL DATA:  Shortness of breath today.  Wheezing. EXAM: CHEST - 2 VIEW COMPARISON:  PA and lateral chest 08/09/2017 06/10/2015. CT chest 06/10/2015. FINDINGS: There is marked cardiomegaly without edema. The lungs are emphysematous but clear. No pneumothorax or pleural fluid. Elevation of the right hemidiaphragm relative to the left noted. Aortic atherosclerosis is seen. The patient is status post CABG. No acute or focal bony abnormality. IMPRESSION: No acute disease. Emphysema. Cardiomegaly. Atherosclerosis. Electronically Signed   By: Drusilla Kanner M.D.   On: 11/14/2017 12:10   US Abdomen Limited Ruq  Result Date: 11/14/2017 CLINICAL DATA:  Elevated bilirubin with fever EXAM: ULTRASOUND ABDOMEN LIMITED RIGHT UPPER QUADRANT COMPARISON:  CT 09/27/2016 FINDINGS: Gallbladder: Small amount of tumefactive sludge and stones in the gallbladder. Normal wall thickness. Negative sonographic Murphy. Common bile duct: Diameter: Dilated at 1.6 cm Liver: Heterogeneous  with nodular contour suggesting cirrhosis. Anterior cyst measuring 2.9 x 2.6 x 2.6 cm. Portal vein is patent on color Doppler imaging with normal direction of blood flow towards the liver. IMPRESSION: 1. Enlarged extrahepatic common bile duct up to 1.6 cm, could be further evaluated with MRCP. 2. Cirrhotic appearing liver 3. Small amount of sludge and stones without the gallbladder. Negative for acute cholecystitis by sonography. Electronically Signed   By: Jasmine Pang M.D.   On: 11/14/2017 14:20    EKG: Orders placed or performed during the hospital encounter of 11/14/17  . EKG 12-Lead  . EKG 12-Lead  . ED EKG  . ED EKG  . ED EKG 12-Lead  . ED EKG 12-Lead    IMPRESSION AND PLAN: *Acute sepsis secondary to urinary tract infection Admit to regular nursing for bed on our sepsis protocol, empiric Rocephin, follow-up on cultures  *Acute probable urinary tract infection Plan of care as stated above  *Acute abnormal ultrasound of the abdomen Noted for dilated common bile duct of 1.6 cm/cirrhosis/sludge and stones within the gallbladder  Per ED attending-gastroenterology/Dr. Tobi Bastos notified, for MRCP for further evaluation Per family-patient with history of gallbladder disease  *Chronic dementia Increase nursing care PRN, aspiration/fall/skin care precautions while in house  *Chronic cough Speech therapy to evaluate/treat, aspiration precautions, breathing treatments as needed Chest x-ray noted for COPD changes/cardiomegaly  *Acute kidney injury Hold Lasix, avoid nephrotoxic agents, gentle IV fluids for rehydration given history of heart failure, strict I&O monitoring, daily weights, BMP in the morning  *Chronic GERD without esophagitis PPI daily  *  History of chronic systolic congestive heart failure-without exacerbation  Aspirin, hold Lasix given acute renal failure, start Coreg, avoid ACE/arms for now given renal insufficiency, continue aspirin  All the records are reviewed and  case discussed with ED provider. Management plans discussed with the patient, family and they are in agreement.  CODE STATUS:DNR Code Status History    Date Active Date Inactive Code Status Order ID Comments User Context   08/09/2017 1452 08/10/2017 1823 Full Code [id number]  Enedina Finner, MD Inpatient   07/05/2017 0250 07/06/2017 1714 Full Code [id number]  Barbaraann Rondo, MD Inpatient   03/26/2017 2049 03/28/2017 1546 Full Code [id number]  Alford Highland, MD ED   04/30/2016 0133 05/01/2016 1526 Full Code [id number]  Oralia Manis, MD ED   09/08/2014 1719 09/09/2014 2011 Full Code [id number]  Shaune Pollack, MD Inpatient    Advance Directive Documentation     Most Recent Value  Type of Advance Directive  Out of facility DNR (pink MOST or yellow form)  Pre-existing out of facility DNR order (yellow form or pink MOST form)  -  "MOST" Form in Place?  -       TOTAL TIME TAKING CARE OF THIS PATIENT: 40 minutes.    Evelena Asa Salary M.D on 11/14/2017   Between 7am to 6pm - Pager - [phone]  After 6pm go to [email or web address] - password [email or web address]  [facility]  Office  [phone]  CC: Primary care physician; System, Pcp Not In   Note: This dictation was prepared with Dragon dictation along with smaller phrase technology. Any transcriptional errors that result from this process are unintentional.

## 2017-11-14 NOTE — Consult Note (Signed)
Wyline Mood , MD [address], [address], [place], [place], [address] [address], [place], [place], [address] Phone: [phone]  Fax: [phone]  Consultation  Referring Provider:   ER  Primary Care Physician:  System, Pcp Not In Primary Gastroenterologist: None        Reason for Consultation:     Abnormal LFT  Date of Admission:  11/14/2017 Date of Consultation:  11/14/2017         HPI:   Cynthia [name] is a 77 y.o. female presented to the ER afternoon with hypoxia and confusion.  I was consulted because the total bilirubin was elevated at 3.6 and right upper quadrant ultrasound showed a common bile duct dilated to 16 mm.  When I went to see the patient in the emergency room she appeared confused was not able to open her eyes and unable to provide any history.  I did discuss with her family members were present in the room and they mentioned that she was doing fine till today when they were told that she was very confused.  They do not believe that she has had any new onset abdominal pain or any other complaints that has been brought to her knowledge.  Past Medical History:  Diagnosis Date  . Blood clot in vein   . Coronary artery disease   . GERD (gastroesophageal reflux disease)   . Heart attack (HCC)   . Hx of CABG 2013  . Hyperlipidemia   . Hypertension     Past Surgical History:  Procedure Laterality Date  . CARDIAC SURGERY    . HIP SURGERY Left 12/2014  . SHOULDER SURGERY Bilateral     Prior to Admission medications   Medication Sig Start Date End Date Taking? Authorizing Provider  aspirin EC 81 MG tablet Take 81 mg by mouth daily.   Yes [provider]  benzonatate (TESSALON) 100 MG capsule Take 100 mg by mouth 3 (three) times daily as needed for cough.   Yes [provider]  furosemide (LASIX) 80 MG tablet Take 80 mg by mouth daily.   Yes [provider]  HYDROcodone-acetaminophen (NORCO/VICODIN) 5-325 MG tablet  Take 1 tablet by mouth 2 (two) times daily as needed for moderate pain.   Yes [provider]  ipratropium-albuterol (DUONEB) 0.5-2.5 (3) MG/3ML SOLN Take 3 mLs by nebulization every 6 (six) hours as needed (for shortness of breath/wheezing).    Yes [provider]  isosorbide mononitrate (IMDUR) 30 MG 24 hr tablet Take 30 mg by mouth daily.   Yes [provider]  latanoprost (XALATAN) 0.005 % ophthalmic solution Place 1 drop into both eyes at bedtime.   Yes [provider]  LORazepam (ATIVAN) 2 MG/ML concentrated solution Take 0.5 mg by mouth every 2 (two) hours as needed for anxiety (agitation).   Yes [provider]  Morphine Sulfate (MORPHINE CONCENTRATE) 10 mg / 0.5 ml concentrated solution Place 4 mg under the tongue every 2 (two) hours as needed for severe pain.   Yes [provider]  nitroGLYCERIN (NITROSTAT) 0.4 MG SL tablet Place 0.4 mg under the tongue every 5 (five) minutes as needed for chest pain.   Yes [provider]  potassium chloride (K-DUR,KLOR-CON) 10 MEQ tablet Take 10 mEq by mouth daily.   Yes [provider]  salmeterol (SEREVENT) 50 MCG/DOSE diskus inhaler Inhale 1 puff into the lungs 2 (two) times daily.   Yes [provider]  sennosides-docusate sodium (SENOKOT-S) 8.6-50  MG tablet Take 1 tablet by mouth daily.   Yes [provider]    Family History  Problem Relation Age of Onset  . Hypertension Other   . Alcohol abuse Other      Social History   Tobacco Use  . Smoking status: Current Every Day Smoker    Packs/day: 0.10    Years: 35.00    Pack years: 3.50    Types: Cigarettes  . Smokeless tobacco: Never Used  Substance Use Topics  . Alcohol use: No  . Drug use: No    Allergies as of 11/14/2017 - Review Complete 11/14/2017  Allergen Reaction Noted  . Penicillins Swelling and Other (See Comments) 06/15/2014    Review of Systems:    Could not be assessed as the  patient cannot give a history   Physical Exam:  Vital signs in last 24 hours: Temp:  [98.4 F (36.9 C)-103 F (39.4 C)] 99.1 F (37.3 C) (10/22 1601) Pulse Rate:  [58-84] 75 (10/22 1630) Resp:  [20-29] 26 (10/22 1630) BP: (96-130)/(72-91) 123/80 (10/22 1630) SpO2:  [92 %-99 %] 97 % (10/22 1630) Weight:  [92.5 kg] 92.5 kg (10/22 1117)   General:   Very drowsy with eyes closed unable to take up Head:  Normocephalic and atraumatic. Eyes:   No icterus.   Conjunctiva pink. PERRLA. Ears: Cannot assess Neck:  Supple; no masses or thyroidomegaly Lungs: Increased air entry bilaterally, wheezing noted on the anterior aspect of her chest on the right side. Heart:  Regular rate and rhythm;  Without murmur, clicks, rubs or gallops Abdomen:  Soft, nondistended, nontender. Normal bowel sounds. No appreciable masses or hepatomegaly.  No rebound or guarding.  Neurologic:  Alert and oriented x0  Skin:  Intact without significant lesions or rashes. Cervical Nodes:  No significant cervical adenopathy. Psych: Drowsy and could not assess  LAB RESULTS: Recent Labs    11/14/17 1144  WBC 9.7  HGB 12.3  HCT 37.7  PLT 115*   BMET Recent Labs    11/14/17 1144  NA 139  K 3.8  CL 102  CO2 26  GLUCOSE 103*  BUN 31*  CREATININE 1.88*  CALCIUM 9.2   LFT Recent Labs    11/14/17 1144  PROT 7.3  ALBUMIN 3.8  AST 20  ALT 13  ALKPHOS 132*  BILITOT 3.4*   PT/INR No results for input(s): LABPROT, INR in the last 72 hours.  STUDIES: Dg Chest 2 View  Result Date: 11/14/2017 CLINICAL DATA:  Shortness of breath today.  Wheezing. EXAM: CHEST - 2 VIEW COMPARISON:  PA and lateral chest 08/09/2017 06/10/2015. CT chest 06/10/2015. FINDINGS: There is marked cardiomegaly without edema. The lungs are emphysematous but clear. No pneumothorax or pleural fluid. Elevation of the right hemidiaphragm relative to the left noted. Aortic atherosclerosis is seen. The patient is status post CABG. No acute or  focal bony abnormality. IMPRESSION: No acute disease. Emphysema. Cardiomegaly. Atherosclerosis. Electronically Signed   By: Drusilla Kanner M.D.   On: 11/14/2017 12:10   US Abdomen Limited Ruq  Result Date: 11/14/2017 CLINICAL DATA:  Elevated bilirubin with fever EXAM: ULTRASOUND ABDOMEN LIMITED RIGHT UPPER QUADRANT COMPARISON:  CT 09/27/2016 FINDINGS: Gallbladder: Small amount of tumefactive sludge and stones in the gallbladder. Normal wall thickness. Negative sonographic Murphy. Common bile duct: Diameter: Dilated at 1.6 cm Liver: Heterogeneous with nodular contour suggesting cirrhosis. Anterior cyst measuring 2.9 x 2.6 x 2.6 cm. Portal vein is patent on color Doppler imaging with normal direction of blood  flow towards the liver. IMPRESSION: 1. Enlarged extrahepatic common bile duct up to 1.6 cm, could be further evaluated with MRCP. 2. Cirrhotic appearing liver 3. Small amount of sludge and stones without the gallbladder. Negative for acute cholecystitis by sonography. Electronically Signed   By: Jasmine Pang M.D.   On: 11/14/2017 14:20      Impression / Plan:   Cynthia [name] is a 77 y.o. y/o female admitted via the ER for hypoxia, confusion concern for sepsis.  Being treated for a pneumonia.  On her labs incidentally found to have an elevated total bilirubin at 3.6 and right upper quadrant ultrasound shows dilated common bile duct at 16 mm also noted to have a cirrhotic appearing liver.  Small amount of sludge and stones seen within the gallbladder.  No evidence of cholecystitis by ultrasound.  Cholangitis is less likely that there is no abdominal pain but in view of the sepsis I would cover with antibiotics broadly to cover her chest and abdomen.     Plan 1.  MRCP to evaluate elevated total bilirubin and dilated common bile duct and depnding on the results may need an ERCP 2.  IV fluids IV antibiotics , cover IV antibiotics to include the abdomen for cholangitis as well as the chest,  check fractionated total bilirubin.  I have discussed the plan with the ER physician and with the family.  Thank you for involving me in the care of this patient.      LOS: 0 days   Wyline Mood, MD  11/14/2017, 4:45 PM

## 2017-11-14 NOTE — ED Notes (Signed)
Family at bedside.  Update on pt given.

## 2017-11-14 NOTE — Progress Notes (Signed)
CODE SEPSIS - PHARMACY COMMUNICATION  **Broad Spectrum Antibiotics should be administered within 1 hour of Sepsis diagnosis**  Time Code Sepsis Called/Page Received: 1149  Antibiotics Ordered: Aztreonam, Metronidazole, vancomyicn  Time of 1st antibiotic administration: 1229  Additional action taken by pharmacy: n/a  If necessary, Name of Provider/Nurse Contacted: n/a    Orinda Kenner ,PharmD Clinical Pharmacist  11/14/2017  12:11 PM

## 2017-11-14 NOTE — Consult Note (Signed)
Pharmacy Antibiotic Note  Cynthia [name] is a 77 y.o. female admitted on 11/14/2017 with sepsis.  Pharmacy has been consulted for aztreonam and vancomycin dosing.  Plan: Vancomycin 1000 IV once followed by 6 hour stacked dosing vancomycin 1250 mg every 24 hours.  Goal trough 15-20 mcg/mL.  Will draw trough prior to the fifth dose.  Aztreonam 2 gm IV every 8 hours.  Will monitor CrCl for dose adjustments.  Height: 6' (182.9 cm) Weight: 204 lb (92.5 kg) IBW/kg (Calculated) : 73.1  Temp (24hrs), Avg:101.1 F (38.4 C), Min:99.1 F (37.3 C), Max:103 F (39.4 C)  Recent Labs  Lab 11/14/17 1133 11/14/17 1144  WBC  --  9.7  CREATININE  --  1.88*  LATICACIDVEN 1.1  --     Estimated Creatinine Clearance: 32.5 mL/min (A) (by C-G formula based on SCr of 1.88 mg/dL (H)).    Allergies  Allergen Reactions  . Penicillins Swelling and Other (See Comments)    Has patient had a PCN reaction causing immediate rash, facial/tongue/throat swelling, SOB or lightheadedness with hypotension: No Has patient had a PCN reaction causing severe rash involving mucus membranes or skin necrosis: No Has patient had a PCN reaction that required hospitalization: No Has patient had a PCN reaction occurring within the last 10 years: No If all of the above answers are "NO", then may proceed with Cephalosporin use.      Antimicrobials this admission: Aztreonam 10/22 >>  Metronidazole 10/22 >>  Vancomycin 10/22 >>  Dose adjustments this admission:   Microbiology results: 10/22 BCx: pending 10/21 UCx:  pending  Sputum:    MRSA PCR:   Thank you for allowing pharmacy to be a part of this patient's care.  Orinda Kenner, PharmD 11/14/2017 2:10 PM

## 2017-11-14 NOTE — ED Provider Notes (Signed)
[facility] Emergency Department Provider Note  Time seen: 11:46 AM  I have reviewed the triage vital signs and the nursing notes.   HISTORY  Chief Complaint Shortness of Breath    HPI Cynthia [name] is a 77 y.o. female with a past medical history of gastric reflux, hypertension, hyperlipidemia, CHF, COPD, presents to the emergency department for dyspnea/hypoxia.  According EMS report patient lives at home, home health nurse noted wheezing today, checked a pulse ox reading which is reading 84% give a breathing treatment but continued to read in the 80s so she called EMS to bring the patient to the emergency department.  EMS states continued hypoxia in the 80s off of oxygen, satting in the low 90s on 2 L of oxygen.  Here the patient is somnolent/fatigued appearing, will awake to answer questions which seems to be appropriate and then falls back asleep.  Denies any chest pain, denies any shortness of breath does state mild cough.  Denies abdominal pain recent vomiting or diarrhea.  No dysuria.  No known fever.   Past Medical History:  Diagnosis Date  . Blood clot in vein   . Coronary artery disease   . GERD (gastroesophageal reflux disease)   . Heart attack (HCC)   . Hx of CABG 2013  . Hyperlipidemia   . Hypertension     Patient Active Problem List   Diagnosis Date Noted  . CHF (congestive heart failure) (HCC) 08/09/2017  . COPD exacerbation (HCC) 03/26/2017  . Pelvic fracture (HCC) 04/30/2016  . HTN (hypertension) 04/30/2016  . HLD (hyperlipidemia) 04/30/2016  . GERD (gastroesophageal reflux disease) 04/30/2016  . CAD (coronary artery disease) 04/30/2016  . Chest pain 09/08/2014  . Dehydration 09/08/2014    Past Surgical History:  Procedure Laterality Date  . CARDIAC SURGERY    . HIP SURGERY Left 12/2014  . SHOULDER SURGERY Bilateral     Prior to Admission medications   Medication Sig Start Date End Date Taking? Authorizing Provider   acetaminophen (TYLENOL) 650 MG CR tablet Take 650 mg by mouth every 12 (twelve) hours as needed for pain.    [provider]  albuterol (PROVENTIL) (2.5 MG/3ML) 0.083% nebulizer solution Take 2.5 mg by nebulization every 4 (four) hours as needed for wheezing or shortness of breath.    [provider]  aspirin EC 81 MG tablet Take 81 mg by mouth daily.    [provider]  benzonatate (TESSALON) 100 MG capsule Take 100 mg by mouth 3 (three) times daily as needed for cough.    [provider]  DULoxetine (CYMBALTA) 60 MG capsule Take 60 mg by mouth daily.    [provider]  furosemide (LASIX) 40 MG tablet Take 40 mg by mouth every Monday, Wednesday, and Friday.     [provider]  gabapentin (NEURONTIN) 300 MG capsule Take 300 mg by mouth at bedtime.    [provider]  HYDROcodone-acetaminophen (NORCO/VICODIN) 5-325 MG tablet Take 1 tablet by mouth 2 (two) times daily as needed for moderate pain.    [provider]  hydrocortisone cream 0.5 % Apply 1 application topically 2 (two) times daily as needed for itching.    [provider]  ipratropium-albuterol (DUONEB) 0.5-2.5 (3) MG/3ML SOLN Take 3 mLs by nebulization every 6 (six) hours.     [provider]  isosorbide mononitrate (IMDUR) 30 MG 24 hr tablet Take 30 mg by mouth daily.    [provider]  latanoprost (XALATAN) 0.005 %  ophthalmic solution Place 1 drop into both eyes at bedtime.    [provider]  LORazepam (ATIVAN) 2 MG/ML concentrated solution Take 0.5 mg by mouth every 2 (two) hours as needed for anxiety (agitation).    [provider]  Morphine Sulfate (MORPHINE CONCENTRATE) 10 mg / 0.5 ml concentrated solution Place 4 mg under the tongue every 2 (two) hours as needed for severe pain.    [provider]  naloxone [facility]) nasal spray 4 mg/0.1 mL Place 1 spray into the nose once. Repeat every 3 minutes if needed  for unresponsiveness    [provider]  nitroGLYCERIN (NITROSTAT) 0.3 MG SL tablet Place 0.3 mg under the tongue every 5 (five) minutes as needed for chest pain.    [provider]  Polyethyl Glycol-Propyl Glycol (SYSTANE) 0.4-0.3 % GEL ophthalmic gel Place 1 application into both eyes at bedtime.    [provider]  QUEtiapine (SEROQUEL) 25 MG tablet Take 25 mg by mouth at bedtime.    [provider]  salmeterol (SEREVENT) 50 MCG/DOSE diskus inhaler Inhale 1 puff into the lungs 2 (two) times daily.    [provider]  sennosides-docusate sodium (SENOKOT-S) 8.6-50 MG tablet Take 1 tablet by mouth daily.    [provider]  Zinc Oxide (DESITIN) 13 % CREA Place 1 application into the perineum 4 (four) times daily as needed (skin protectant).    [provider]    Allergies  Allergen Reactions  . Penicillins Swelling and Other (See Comments)    Has patient had a PCN reaction causing immediate rash, facial/tongue/throat swelling, SOB or lightheadedness with hypotension: No Has patient had a PCN reaction causing severe rash involving mucus membranes or skin necrosis: No Has patient had a PCN reaction that required hospitalization: No Has patient had a PCN reaction occurring within the last 10 years: No If all of the above answers are "NO", then may proceed with Cephalosporin use.      Family History  Problem Relation Age of Onset  . Hypertension Other   . Alcohol abuse Other     Social History Social History   Tobacco Use  . Smoking status: Current Every Day Smoker    Packs/day: 0.10    Years: 35.00    Pack years: 3.50    Types: Cigarettes  . Smokeless tobacco: Never Used  Substance Use Topics  . Alcohol use: No  . Drug use: No    Review of Systems Constitutional: No known fever however the patient is febrile to 103 upon arrival. ENT: Negative for recent illness/congestion Cardiovascular: Negative for chest  pain. Respiratory: Negative for shortness of breath.  Positive for wheeze today. Gastrointestinal: Negative for abdominal pain, vomiting and diarrhea. Genitourinary: Negative for urinary compaints Neurological: Negative for headache All other ROS negative, although possibly limited due to patient's mental status.  ____________________________________________   PHYSICAL EXAM:  VITAL SIGNS: ED Triage Vitals  Enc Vitals Group     BP 11/14/17 1123 130/72     Pulse Rate 11/14/17 1117 79     Resp 11/14/17 1117 (!) 24     Temp 11/14/17 1117 99.1 F (37.3 C)     Temp Source 11/14/17 1117 Oral     SpO2 11/14/17 1117 92 %     Weight 11/14/17 1117 204 lb (92.5 kg)     Height 11/14/17 1117 6' (1.829 m)     Head Circumference --      Peak Flow --  Pain Score --      Pain Loc --      Pain Edu? --      Excl. in GC? --    Constitutional: Alert, no distress but she is somewhat somnolent appearing, will fall asleep if not being actively engaged. Eyes: Normal exam ENT   Head: Normocephalic and atraumatic   Mouth/Throat: Mucous membranes are moist. Cardiovascular: Normal rate, regular rhythm.  Respiratory: Slight tachypnea with bilateral wheeze right greater than left.  No rhonchi or rales. Gastrointestinal: Soft and nontender. No distention.   Musculoskeletal: Nontender with normal range of motion in all extremities.  Neurologic:  Normal speech and language. No gross focal neurologic deficits Skin:  Skin is warm, dry and intact.  Psychiatric: Mood and affect are normal.  ____________________________________________    EKG  EKG reviewed and interpreted by myself shows a normal sinus rhythm at 79 bpm with a widened QRS, normal axis, largely normal intervals with nonspecific ST changes.  ____________________________________________    RADIOLOGY  Chest x-ray is negative. Right upper quadrant ultrasound shows a extrahepatic CBD 1.6  cm.  ____________________________________________   INITIAL IMPRESSION / ASSESSMENT AND PLAN / ED COURSE  Pertinent labs & imaging results that were available during my care of the patient were reviewed by me and considered in my medical decision making (see chart for details).  Patient presents to the emergency department for wheezing found to be hypoxic by home health nurse.  On arrival patient found to be febrile to 103, she is quite somnolent but does awaken and answer questions which seem appropriate.  Differential includes sepsis, pneumonia, COPD exacerbation, URI, other infectious etiology such as urinary tract infection.  Reassuringly patient has a benign abdominal exam, she does have wheeze right greater than left on my lung examination.  We will obtain a portable chest x-ray, labs including blood cultures and the lactic acid, start broad-spectrum antibiotics as well as IV fluids while awaiting results.  Patient's labs are resulted showing an elevated bilirubin, appears to be slightly elevated in the past we will order right upper quadrant ultrasound to further evaluate.  Patient's ultrasound is negative for cholecystitis but does show a dilated CBD to 1.6 cm.  Patient's lab work also shows a urinary tract infection.  Patient is deceiving IV antibiotics already.  Urine culture is pending.  I discussed the patient with GI medicine given the dilated CBD 1.6 cm in febrile patient concerning for possible cholangitis.  They recommend MRCP and they will consult on the patient.  I discussed the findings with the patient's family.  Patient will be admitted to the hospitalist service.    CRITICAL CARE Performed by: Minna Antis   Total critical care time: 45 minutes  Critical care time was exclusive of separately billable procedures and treating other patients.  Critical care was necessary to treat or prevent imminent or life-threatening deterioration.  Critical care was time spent  personally by me on the following activities: development of treatment plan with patient and/or surrogate as well as nursing, discussions with consultants, evaluation of patient's response to treatment, examination of patient, obtaining history from patient or surrogate, ordering and performing treatments and interventions, ordering and review of laboratory studies, ordering and review of radiographic studies, pulse oximetry and re-evaluation of patient's condition. ____________________________________________   FINAL CLINICAL IMPRESSION(S) / ED DIAGNOSES  Sepsis Urinary tract infection    Minna Antis, MD 11/14/17 [phone]

## 2017-11-14 NOTE — Progress Notes (Signed)
Family Meeting Note  Advance Directive:yes  Today a meeting took place with the Patient and family/husband.  Patient is unable to participate due [id number] capacity dementia   The following clinical team members were present during this meeting:MD  The following were discussed:Patient's diagnosis: Sepsis, UTI, dementia, Patient's progosis: Unable to determine and Goals for treatment: DNR  Additional follow-up to be provided: prn  Time spent during discussion:20 minutes  Bertrum Sol, MD

## 2017-11-14 NOTE — ED Notes (Signed)
CT called.  Stated pt was kicking and pulling at wires, tubes, making it unable to get Scan.  Dr. Sheela Stack made aware.

## 2017-11-14 NOTE — ED Notes (Signed)
Pt put on 2L O2.  Sats 95%.

## 2017-11-14 NOTE — ED Notes (Signed)
Azactam not in Pyxis.  Pharmacy called.  Stated they were sending it up.

## 2017-11-14 NOTE — ED Notes (Signed)
Pt off floor for MRCP

## 2017-11-15 ENCOUNTER — Inpatient Hospital Stay: Payer: Medicare (Managed Care)

## 2017-11-15 LAB — BLOOD CULTURE ID PANEL (REFLEXED)
Acinetobacter baumannii: NOT DETECTED
CANDIDA KRUSEI: NOT DETECTED
CANDIDA PARAPSILOSIS: NOT DETECTED
CANDIDA TROPICALIS: NOT DETECTED
Candida albicans: NOT DETECTED
Candida glabrata: NOT DETECTED
Carbapenem resistance: NOT DETECTED
ENTEROCOCCUS SPECIES: NOT DETECTED
Enterobacter cloacae complex: NOT DETECTED
Enterobacteriaceae species: DETECTED — AB
Escherichia coli: DETECTED — AB
Haemophilus influenzae: NOT DETECTED
KLEBSIELLA OXYTOCA: NOT DETECTED
KLEBSIELLA PNEUMONIAE: NOT DETECTED
Listeria monocytogenes: NOT DETECTED
Neisseria meningitidis: NOT DETECTED
PROTEUS SPECIES: NOT DETECTED
Pseudomonas aeruginosa: NOT DETECTED
SERRATIA MARCESCENS: NOT DETECTED
STAPHYLOCOCCUS SPECIES: NOT DETECTED
Staphylococcus aureus (BCID): NOT DETECTED
Streptococcus agalactiae: NOT DETECTED
Streptococcus pneumoniae: NOT DETECTED
Streptococcus pyogenes: NOT DETECTED
Streptococcus species: NOT DETECTED

## 2017-11-15 LAB — BASIC METABOLIC PANEL
Anion gap: 10 (ref 5–15)
BUN: 31 mg/dL — AB (ref 8–23)
CHLORIDE: 108 mmol/L (ref 98–111)
CO2: 24 mmol/L (ref 22–32)
CREATININE: 1.72 mg/dL — AB (ref 0.44–1.00)
Calcium: 8.8 mg/dL — ABNORMAL LOW (ref 8.9–10.3)
GFR calc Af Amer: 32 mL/min — ABNORMAL LOW (ref >60)
GFR calc non Af Amer: 28 mL/min — ABNORMAL LOW (ref >60)
GLUCOSE: 111 mg/dL — AB (ref 70–99)
Potassium: 3.7 mmol/L (ref 3.5–5.1)
Sodium: 142 mmol/L (ref 135–145)

## 2017-11-15 LAB — HEPATIC FUNCTION PANEL
ALT: 12 U/L (ref 0–44)
AST: 24 U/L (ref 15–41)
Albumin: 3.3 g/dL — ABNORMAL LOW (ref 3.5–5.0)
Alkaline Phosphatase: 102 U/L (ref 38–126)
Bilirubin, Direct: 1.1 mg/dL — ABNORMAL HIGH (ref 0.0–0.2)
Indirect Bilirubin: 1 mg/dL — ABNORMAL HIGH (ref 0.3–0.9)
TOTAL PROTEIN: 6.7 g/dL (ref 6.5–8.1)
Total Bilirubin: 2.1 mg/dL — ABNORMAL HIGH (ref 0.3–1.2)

## 2017-11-15 LAB — INFLUENZA PANEL BY PCR (TYPE A & B)
INFLAPCR: NEGATIVE
INFLBPCR: NEGATIVE

## 2017-11-15 LAB — TROPONIN I
Troponin I: 0.06 ng/mL (ref <0.03)
Troponin I: 0.06 ng/mL (ref <0.03)

## 2017-11-15 MED ORDER — CHLORHEXIDINE GLUCONATE CLOTH 2 % EX PADS
6.0000 | MEDICATED_PAD | Freq: Every day | CUTANEOUS | Status: DC
  Administered 2017-11-16: 6 via TOPICAL

## 2017-11-15 MED ORDER — BOOST / RESOURCE BREEZE PO LIQD CUSTOM
1.0000 | Freq: Three times a day (TID) | ORAL | Status: DC
  Administered 2017-11-15 – 2017-11-17 (×4): 1 via ORAL

## 2017-11-15 MED ORDER — HALOPERIDOL LACTATE 5 MG/ML IJ SOLN
5.0000 mg | Freq: Four times a day (QID) | INTRAMUSCULAR | Status: DC | PRN
  Administered 2017-11-15 – 2017-11-17 (×4): 5 mg via INTRAVENOUS
  Filled 2017-11-15 (×6): qty 1

## 2017-11-15 MED ORDER — SODIUM CHLORIDE 0.9 % IV SOLN
INTRAVENOUS | Status: DC | PRN
  Administered 2017-11-15 – 2017-11-16 (×2): 250 mL via INTRAVENOUS

## 2017-11-15 MED ORDER — SODIUM CHLORIDE 0.9 % IV SOLN
1.0000 g | Freq: Two times a day (BID) | INTRAVENOUS | Status: DC
  Administered 2017-11-15 – 2017-11-16 (×2): 1 g via INTRAVENOUS
  Filled 2017-11-15 (×5): qty 1

## 2017-11-15 MED ORDER — MUPIROCIN 2 % EX OINT
1.0000 "application " | TOPICAL_OINTMENT | Freq: Two times a day (BID) | CUTANEOUS | Status: DC
  Administered 2017-11-16 – 2017-11-17 (×4): 1 via NASAL
  Filled 2017-11-15: qty 22

## 2017-11-15 MED ORDER — ADULT MULTIVITAMIN W/MINERALS CH
1.0000 | ORAL_TABLET | Freq: Every day | ORAL | Status: DC
  Administered 2017-11-16 – 2017-11-17 (×2): 1 via ORAL
  Filled 2017-11-15 (×2): qty 1

## 2017-11-15 NOTE — Evaluation (Addendum)
Clinical/Bedside Swallow Evaluation Patient Details  Name: Cynthia [name] MRN: [id number] Date of Birth: 26-Apr-1940  Today's Date: 11/15/2017 Time:        Past Medical History:  Past Medical History:  Diagnosis Date  . Blood clot in vein   . Coronary artery disease   . GERD (gastroesophageal reflux disease)   . Heart attack (HCC)   . Hx of CABG 2013  . Hyperlipidemia   . Hypertension    Past Surgical History:  Past Surgical History:  Procedure Laterality Date  . CARDIAC SURGERY    . HIP SURGERY Left 12/2014  . SHOULDER SURGERY Bilateral    HPI:  Pt  is a 77 y.o. female with a known history including chronic Dementia, GERD, CABG, HTN presenting to the emergency room with O2 saturation in the 80s at home, patient was noted to be lethargic, patient is poor historian due to chronic dementia, family at the bedside, patient noted to be tachycardic, tachypneic, creatinine 1.8 with baseline normal, uric acid level normal, ultrasound of the abdomen noted for increased common bile duct 1.6 cm/cirrhosis/sludge and stones within the gallbladder, ED attending did discuss case with Dr. Anna/gastroenterology-MRCP recommended, patient evaluated in the emergency room, family at the bedside as well as husband, patient is now been admitted for acute sepsis secondary to acute urinary tract infection, and acute abnormal ultrasound of the abdomen noted for common bile duct dilatation/cirrhosis/with gallstones. CXR yesterday revealed No acute disease.  Assessment / Plan / Recommendation Clinical Impression  Pt appears to present w/ grossly adequate oropharyngeal phase swallowing function w/ po trials presented and is at reduced risk for aspiration when following general aspiration precautions and when given support w/ feeding/po intake. However, pt does have baseline mod. Cognitive decline w/ dx of Dementia and is currently labile w/ min increased WOB w/ any exertion. She has been quite agitated during  this admission. Pt c/o her stomach "hurting" which MD/NSG are aware of. Pt has been on a Clear liquid diet today. IV fluids were stopped due to CHF per NSG report. Pt required frequent verbal cues to redirect attention to po tasks and feeding support; she did not attempt to self-feed. Pt consumed po trials thin liquids via Cup/Straw and few tsps of puree w/ no immediate, overt s/s of aspiration noted. No wet vocal quality or decline in respiratory status was noted during/post trials - however, pt continued to present w/ similar min WOB w/ any exertion. Rest breaks were given b/t trials. No immediate coughing or throat clearing during/post trials. Pt's oral phase was grossly [facility] w/ po trials c/b adequate oral phase time for management of the trials given; cohesive bolus mangement for A-P transfer. Pt exhibited appropriate oral clearing w/ food/liquids. Brief OM exam revealed no unilateral weakness w/ lingual/labial movements; speech was clear though pt was Labile. Post discussion w/ MD, recommended a Full liquid diet (a puree type consistency diet but maybe tolerable w/ GI issues currently) w/ Thin liquids via Cup/Straw. General aspiration precautions w/ Rest Breaks d/t lessen WOB - pt should only eat/drink when respirations are calm, Cognitive status is calm. Tray setup and feeding support at all meals. Recommend Pills WHOLE in puree if needed for safer swallowing though NSG reported good toleration w/ liquids this AM. Recommend monitoring of pt for toleration of diet. ST services will continue to f/u w/ pt's toleration of oral diet; education.  SLP Visit Diagnosis: Dysphagia, unspecified (R13.10)(GI issues at this time w/ need for modified diet - Full liqs)  Aspiration Risk  Mild aspiration risk(d/t declined Cognitive status; Dementia; GI)    Diet Recommendation  a Puree consistency/based diet at this time d/t Cognitive status; GI illness - Full Liquids w/ Thin liquid consistency; aspiration precautions;  monitoring at all meals to assist w/ feeding  Medication Administration: Whole meds with puree(IF needed for safer swallowing)    Other  Recommendations Recommended Consults: (GI Following; Dietician f/u) Oral Care Recommendations: Oral care BID;Staff/trained caregiver to provide oral care Other Recommendations: (n/a)   Follow up Recommendations None      Frequency and Duration min 2x/week  1 week       Prognosis Prognosis for Safe Diet Advancement: Fair Barriers to Reach Goals: Cognitive deficits;Time post onset;Severity of deficits;Behavior(GI issues)      Swallow Study   General Date of Onset: 11/14/17 HPI: Pt  is a 77 y.o. female with a known history including chronic Dementia, GERD, CABG, HTN presenting to the emergency room with O2 saturation in the 80s at home, patient was noted to be lethargic, patient is poor historian due to chronic dementia, family at the bedside, patient noted to be tachycardic, tachypneic, creatinine 1.8 with baseline normal, uric acid level normal, ultrasound of the abdomen noted for increased common bile duct 1.6 cm/cirrhosis/sludge and stones within the gallbladder, ED attending did discuss case with Dr. Anna/gastroenterology-MRCP recommended, patient evaluated in the emergency room, family at the bedside as well as husband, patient is now been admitted for acute sepsis secondary to acute urinary tract infection, and acute abnormal ultrasound of the abdomen noted for common bile duct dilatation/cirrhosis/with gallstones.  Type of Study: Bedside Swallow Evaluation Previous Swallow Assessment: none reported Diet Prior to this Study: Thin liquids(clear liquids per GI (d/t GI issues)) Temperature Spikes Noted: No(wbc 9.7) Respiratory Status: Nasal cannula(2-5 liters) History of Recent Intubation: No Behavior/Cognition: Alert;Cooperative;Pleasant mood;Confused;Distractible;Requires cueing(Labile) Oral Cavity Assessment: Within Functional Limits Oral Care  Completed by SLP: Recent completion by staff Oral Cavity - Dentition: Missing dentition Vision: Functional for self-feeding Self-Feeding Abilities: Able to feed self;Needs assist;Needs set up;Total assist(needed prompting to feed self) Patient Positioning: Upright in chair Baseline Vocal Quality: Normal(though min WOB; expiratory wheezes) Volitional Cough: Cognitively unable to elicit Volitional Swallow: Unable to elicit    Oral/Motor/Sensory Function Overall Oral Motor/Sensory Function: Within functional limits(no unilateral weakness noted during movements; trials)   Ice Chips Ice chips: Not tested   Thin Liquid Thin Liquid: Within functional limits Presentation: Cup;Self Fed;Straw(~8 ozs total) Other Comments: min WOB continued w/ the exertion of the po trials and her confusion/lability    Nectar Thick Nectar Thick Liquid: Not tested   Honey Thick Honey Thick Liquid: Not tested   Puree Puree: Within functional limits Presentation: Spoon(fed; 3 trials) Other Comments: no further given d/t pt's c/o stomach hurting her - NSG aware   Solid     Solid: Not tested       Jerilynn Som, MS, CCC-SLP Suheyla Mortellaro 11/15/2017,2:43 PM

## 2017-11-15 NOTE — Progress Notes (Signed)
Pt was rocking back and forth in the chair, holding her stomach. Pt was assisted back to bed. Tylenol was given

## 2017-11-15 NOTE — Progress Notes (Signed)
Initial Nutrition Assessment  DOCUMENTATION CODES:   Not applicable  INTERVENTION:   Boost Breeze po TID, each supplement provides 250 kcal and 9 grams of protein  MVI daily  Magic cup TID with meals, each supplement provides 290 kcal and 9 grams of protein  NUTRITION DIAGNOSIS:   Inadequate oral intake related to acute illness as evidenced by other (comment)(pt NPO/liquid diet since admit ).  GOAL:   Patient will meet greater than or equal to 90% of their needs  MONITOR:   PO intake, Supplement acceptance, Labs, Weight trends, Skin, I & O's  REASON FOR ASSESSMENT:   Consult Assessment of nutrition requirement/status  ASSESSMENT:   77 y/o female with h/o COPD, dementia, CHF admitted for acute sepsis secondary to acute urinary tract infection, and acute abnormal ultrasound of the abdomen noted for common bile duct dilatation/cirrhosis/with gallstones.     Met with pt in room today. Pt with dementia and is a poor historian so only able to gather limited history from patient. Pt reports poor appetite today. Pt's breakfast tray was on her side table; it appears pt only ate a few bites of jello and drank some gingerale. Pt reports that she does not like Ensure but she is willing to try peach Boost Breeze. Per chart, pt with multiple weight fluctuations. Pt reports her UBW is ~202lbs; unsure if patient has had any weight loss. Pt advanced to full liquid diet today. RD will add supplements and MVI to help pt meet her estimated needs.    Medications reviewed and include: aspirin, heparin, senokot, meropenem  Labs reviewed: BUN 31(H), creat 1.72(H), tbili 2.1(H)  NUTRITION - FOCUSED PHYSICAL EXAM:    Most Recent Value  Orbital Region  No depletion  Upper Arm Region  No depletion  Thoracic and Lumbar Region  No depletion  Buccal Region  No depletion  Temple Region  No depletion  Clavicle Bone Region  No depletion  Clavicle and Acromion Bone Region  No depletion  Scapular Bone  Region  No depletion  Dorsal Hand  No depletion  Patellar Region  No depletion  Anterior Thigh Region  No depletion  Posterior Calf Region  No depletion  Edema (RD Assessment)  None  Hair  Reviewed  Eyes  Reviewed  Mouth  Reviewed  Skin  Reviewed  Nails  Reviewed     Diet Order:   Diet Order            Diet full liquid Room service appropriate? Yes; Fluid consistency: Thin  Diet effective now             EDUCATION NEEDS:   No education needs have been identified at this time  Skin:  Skin Assessment: Reviewed RN Assessment(ecchymosis )  Last BM:  10/23- type 6  Height:   Ht Readings from Last 1 Encounters:  11/14/17 6' (1.829 m)    Weight:   Wt Readings from Last 1 Encounters:  11/15/17 90.6 kg    Ideal Body Weight:  72.7 kg  BMI:  Body mass index is 27.09 kg/m.  Estimated Nutritional Needs:   Kcal:  1800-2100kcal/day   Protein:  90-108g/day   Fluid:  >1.8L/day   Koleen Distance MS, RD, LDN Pager #- [phone] Office#- [phone] After Hours Pager: [phone]

## 2017-11-15 NOTE — Progress Notes (Signed)
MD aware of elevated temp. PO Tylenol given. No new orders given.

## 2017-11-15 NOTE — Progress Notes (Signed)
Call to Hospitalist regarding spike in temp,directed to give tylenol, swab for flu, stop IV fluids/ due to CHF.

## 2017-11-15 NOTE — Progress Notes (Addendum)
Cynthia [name] , MD [address], [address], [place], [place], [address] 3940 [address], [address], [place], [place], [address] Phone: [phone]  Fax: [phone]   Cynthia [name] is being followed for Abnormal LFT's and dilated common bile duct  Day 1 of follow up   Subjective: Denies any abdominal pain or fever, say she wants to go home,coughing while speaking  confused  Objective: Vital signs in last 24 hours: Vitals:   11/15/17 0500 11/15/17 0510 11/15/17 0653 11/15/17 0804  BP:  135/68    Pulse:  86    Resp:  (!) 32    Temp:  100.2 F (37.9 C) 98.5 F (36.9 C)   TempSrc:  Axillary Axillary   SpO2:  99%  98%  Weight: 90.6 kg     Height:       Weight change:   Intake/Output Summary (Last 24 hours) at 11/15/2017 0910 Last data filed at 11/15/2017 0510 Gross per 24 hour  Intake 1555.77 ml  Output -  Net 1555.77 ml     Exam: Heart:: Regular rate and rhythm, S1S2 present or without murmur or extra heart sounds Lungs: normal, clear to auscultation and clear to auscultation and percussion Abdomen: soft, nontender, normal bowel sounds She is confused  Lab Results: @LABTEST2 @ Micro Results: Recent Results (from the past 240 hour(s))  Blood Culture (routine x 2)     Status: None (Preliminary result)   Collection Time: 11/14/17 11:33 AM  Result Value Ref Range Status   Specimen Description   Final    BLOOD LEFT FOREARM Performed at [facility], [address], [place], [place] [address]    Special Requests   Final    BOTTLES DRAWN AEROBIC AND ANAEROBIC Blood Culture adequate volume   Culture  Setup Time   Final    GRAM NEGATIVE RODS IN BOTH AEROBIC AND ANAEROBIC BOTTLES CRITICAL VALUE NOTED.  VALUE IS CONSISTENT WITH PREVIOUSLY REPORTED AND CALLED VALUE.    Culture   Final    NO GROWTH < 24 HOURS Performed at [facility], [address] [place], [place] [address]    Report Status PENDING  Incomplete  Blood Culture (routine x 2)      Status: None (Preliminary result)   Collection Time: 11/14/17 11:34 AM  Result Value Ref Range Status   Specimen Description   Final    BLOOD RIGHT FOREARM Performed at [facility], [address], [place], [place] [address]    Special Requests   Final    BOTTLES DRAWN AEROBIC AND ANAEROBIC Blood Culture results may not be optimal due to an excessive volume of blood received in culture bottles   Culture  Setup Time   Final    Organism ID to follow GRAM NEGATIVE RODS IN BOTH AEROBIC AND ANAEROBIC BOTTLES CRITICAL RESULT CALLED TO, READ BACK BY AND VERIFIED WITH: DAVID BESANTI @0023  11/15/17 AKT Performed at [facility], [address], [place], [place] [address]    Culture GRAM NEGATIVE RODS  Final   Report Status PENDING  Incomplete  Blood Culture ID Panel (Reflexed)     Status: Abnormal   Collection Time: 11/14/17 11:34 AM  Result Value Ref Range Status   Enterococcus species NOT DETECTED NOT DETECTED Final   Listeria monocytogenes NOT DETECTED NOT DETECTED Final   Staphylococcus species NOT DETECTED NOT DETECTED Final   Staphylococcus aureus (BCID) NOT DETECTED NOT DETECTED Final   Streptococcus species NOT DETECTED NOT DETECTED Final  Streptococcus agalactiae NOT DETECTED NOT DETECTED Final   Streptococcus pneumoniae NOT DETECTED NOT DETECTED Final   Streptococcus pyogenes NOT DETECTED NOT DETECTED Final   Acinetobacter baumannii NOT DETECTED NOT DETECTED Final   Enterobacteriaceae species DETECTED (A) NOT DETECTED Final    Comment: Enterobacteriaceae represent a large family of gram-negative bacteria, not a single organism. CRITICAL RESULT CALLED TO, READ BACK BY AND VERIFIED WITH: DAVID BESANTI @0023  11/15/17 AKT    Enterobacter cloacae complex NOT DETECTED NOT DETECTED Final   Escherichia coli DETECTED (A) NOT DETECTED Final    Comment: CRITICAL RESULT CALLED TO, READ BACK BY AND VERIFIED WITH: DAVID BESANTI @0023  11/15/17 AKT    Klebsiella oxytoca NOT  DETECTED NOT DETECTED Final   Klebsiella pneumoniae NOT DETECTED NOT DETECTED Final   Proteus species NOT DETECTED NOT DETECTED Final   Serratia marcescens NOT DETECTED NOT DETECTED Final   Carbapenem resistance NOT DETECTED NOT DETECTED Final   Haemophilus influenzae NOT DETECTED NOT DETECTED Final   Neisseria meningitidis NOT DETECTED NOT DETECTED Final   Pseudomonas aeruginosa NOT DETECTED NOT DETECTED Final   Candida albicans NOT DETECTED NOT DETECTED Final   Candida glabrata NOT DETECTED NOT DETECTED Final   Candida krusei NOT DETECTED NOT DETECTED Final   Candida parapsilosis NOT DETECTED NOT DETECTED Final   Candida tropicalis NOT DETECTED NOT DETECTED Final    Comment: Performed at [facility], [address] [place], [place] [address]  MRSA PCR Screening     Status: Abnormal   Collection Time: 11/14/17  6:34 PM  Result Value Ref Range Status   MRSA by PCR POSITIVE (A) NEGATIVE Final    Comment:        The GeneXpert MRSA Assay (FDA approved for NASAL specimens only), is one component of a comprehensive MRSA colonization surveillance program. It is not intended to diagnose MRSA infection nor to guide or monitor treatment for MRSA infections. RESULT CALLED TO, READ BACK BY AND VERIFIED WITH: CRYSTAL BASS 11/14/17 2011 REC Performed at [facility], [address] [place] [place], [place] [address]    Studies/Results: Dg Chest 2 View  Result Date: 11/14/2017 CLINICAL DATA:  Shortness of breath today.  Wheezing. EXAM: CHEST - 2 VIEW COMPARISON:  PA and lateral chest 08/09/2017 06/10/2015. CT chest 06/10/2015. FINDINGS: There is marked cardiomegaly without edema. The lungs are emphysematous but clear. No pneumothorax or pleural fluid. Elevation of the right hemidiaphragm relative to the left noted. Aortic atherosclerosis is seen. The patient is status post CABG. No acute or focal bony abnormality. IMPRESSION: No acute disease. Emphysema. Cardiomegaly.  Atherosclerosis. Electronically Signed   By: Drusilla Kanner M.D.   On: 11/14/2017 12:10   Ct Head Wo Contrast  Result Date: 11/14/2017 CLINICAL DATA:  Encephalopathy.  Hypoxia.  No reported injury. EXAM: CT HEAD WITHOUT CONTRAST TECHNIQUE: Contiguous axial images were obtained from the base of the skull through the vertex without intravenous contrast. COMPARISON:  03/31/2017 head CT. FINDINGS: Brain: No evidence of parenchymal hemorrhage or extra-axial fluid collection. No mass lesion, mass effect, or midline shift. No CT evidence of acute infarction. Generalized cerebral volume loss. Nonspecific mild subcortical and periventricular white matter hypodensity, most in keeping with chronic small vessel ischemic change. No ventriculomegaly. Vascular: No acute abnormality. Skull: No evidence of calvarial fracture. Sinuses/Orbits: The visualized paranasal sinuses are essentially clear. Other:  The mastoid air cells are unopacified. IMPRESSION: 1.  No evidence of acute intracranial abnormality. 2. Generalized cerebral volume loss with mild chronic small vessel ischemic  changes in the cerebral white matter. Electronically Signed   By: Delbert Phenix M.D.   On: 11/14/2017 18:39   US Abdomen Limited Ruq  Result Date: 11/14/2017 CLINICAL DATA:  Elevated bilirubin with fever EXAM: ULTRASOUND ABDOMEN LIMITED RIGHT UPPER QUADRANT COMPARISON:  CT 09/27/2016 FINDINGS: Gallbladder: Small amount of tumefactive sludge and stones in the gallbladder. Normal wall thickness. Negative sonographic Murphy. Common bile duct: Diameter: Dilated at 1.6 cm Liver: Heterogeneous with nodular contour suggesting cirrhosis. Anterior cyst measuring 2.9 x 2.6 x 2.6 cm. Portal vein is patent on color Doppler imaging with normal direction of blood flow towards the liver. IMPRESSION: 1. Enlarged extrahepatic common bile duct up to 1.6 cm, could be further evaluated with MRCP. 2. Cirrhotic appearing liver 3. Small amount of sludge and stones  without the gallbladder. Negative for acute cholecystitis by sonography. Electronically Signed   By: Jasmine Pang M.D.   On: 11/14/2017 14:20   Medications: I have reviewed the patient's current medications. Scheduled Meds: . arformoterol  15 mcg Nebulization BID  . aspirin EC  81 mg Oral Daily  . heparin  5,000 Units Subcutaneous Q8H  . ipratropium-albuterol  3 mL Nebulization Once  . isosorbide mononitrate  30 mg Oral Daily  . latanoprost  1 drop Both Eyes QHS  . potassium chloride  10 mEq Oral Daily  . senna-docusate  1 tablet Oral Daily   Continuous Infusions: . meropenem (MERREM) IV     PRN Meds:.acetaminophen **OR** acetaminophen, benzonatate, haloperidol lactate, ipratropium-albuterol, LORazepam, nitroGLYCERIN, ondansetron **OR** ondansetron (ZOFRAN) IV, polyethylene glycol   Assessment: Active Problems:   Sepsis (HCC)  Cynthia [name] is a 77 y.o. y/o female admitted via the ER for hypoxia, confusion and  sepsis.  Being treated for a pneumonia/UTI and enterobacter seen in the blood culture .  On her labs incidentally found to have an elevated total bilirubin at 3.6 and right upper quadrant ultrasound shows dilated common bile duct at 16 mm also noted to have a cirrhotic appearing liver.  Small amount of sludge and stones seen within the gallbladder.  No evidence of cholecystitis by ultrasound.  Cholangitis is less likely that there is no abdominal pain but in view of the sepsis I would cover with antibiotics broadly to cover her chest and abdomen.     Plan 1.F/u  MRCP to evaluate elevated total bilirubin and dilated common bile duct and depnding on the results may need an ERCP if there is an obstruction.  2.  IV fluids IV antibiotics 3. Check LFT's      LOS: 1 day   Cynthia Mood, MD 11/15/2017, 9:10 AM

## 2017-11-15 NOTE — Progress Notes (Signed)
[facility] [place] [facility]      PATIENT NAME: Cynthia [name]    MR#:  [id number]  DATE OF BIRTH:  [birth year]  SUBJECTIVE:   Patient presented to the hospital secondary to suspected sepsis from a UTI.  Patient herself is a poor historian.  Patient had a ultrasound of the abdomen which was suggestive of common bile duct dilatation.  Patient this morning was in tears asking for something to drink.  REVIEW OF SYSTEMS:    Review of Systems  Unable to perform ROS: Mental acuity    Nutrition: Clear liquids Tolerating Diet: yes Tolerating PT: Await Eval.    DRUG ALLERGIES:   Allergies  Allergen Reactions  . Penicillins Swelling and Other (See Comments)    Has patient had a PCN reaction causing immediate rash, facial/tongue/throat swelling, SOB or lightheadedness with hypotension: No Has patient had a PCN reaction causing severe rash involving mucus membranes or skin necrosis: No Has patient had a PCN reaction that required hospitalization: No Has patient had a PCN reaction occurring within the last 10 years: No If all of the above answers are "NO", then may proceed with Cephalosporin use.      VITALS:  Blood pressure 124/70, pulse 85, temperature 98.5 F (36.9 C), temperature source Axillary, resp. rate (!) 32, height 6' (1.829 m), weight 90.6 kg, SpO2 100 %.  PHYSICAL EXAMINATION:   Physical Exam  GENERAL:  76 y.o.-year-old patient lying in bed anxious and in tears and asking for water.  EYES: Pupils equal, round, reactive to light and accommodation. No scleral icterus. Extraocular muscles intact.  HEENT: Head atraumatic, normocephalic. Oropharynx and nasopharynx clear.  NECK:  Supple, no jugular venous distention. No thyroid enlargement, no tenderness.  LUNGS: Normal breath sounds bilaterally, no wheezing, rales, rhonchi. No use of accessory muscles of respiration.  CARDIOVASCULAR: S1, S2 normal. No murmurs, rubs, or gallops.  ABDOMEN: Soft, Tender in  epigastric area, no rebound, rigidity, nondistended. Bowel sounds present. No organomegaly or mass.  EXTREMITIES: No cyanosis, clubbing or edema b/l.    NEUROLOGIC: Cranial nerves II through XII are intact. No focal Motor or sensory deficits b/l.   PSYCHIATRIC: The patient is alert and oriented x 3.  SKIN: No obvious rash, lesion, or ulcer.    LABORATORY PANEL:   CBC Recent Labs  Lab 11/14/17 1144  WBC 9.7  HGB 12.3  HCT 37.7  PLT 115*   ------------------------------------------------------------------------------------------------------------------  Chemistries  Recent Labs  Lab 11/15/17 0549  NA 142  K 3.7  CL 108  CO2 24  GLUCOSE 111*  BUN 31*  CREATININE 1.72*  CALCIUM 8.8*  AST 24  ALT 12  ALKPHOS 102  BILITOT 2.1*   ------------------------------------------------------------------------------------------------------------------  Cardiac Enzymes Recent Labs  Lab 11/15/17 0549  TROPONINI 0.06*   ------------------------------------------------------------------------------------------------------------------  RADIOLOGY:  Dg Chest 2 View  Result Date: 11/14/2017 CLINICAL DATA:  Shortness of breath today.  Wheezing. EXAM: CHEST - 2 VIEW COMPARISON:  PA and lateral chest 08/09/2017 06/10/2015. CT chest 06/10/2015. FINDINGS: There is marked cardiomegaly without edema. The lungs are emphysematous but clear. No pneumothorax or pleural fluid. Elevation of the right hemidiaphragm relative to the left noted. Aortic atherosclerosis is seen. The patient is status post CABG. No acute or focal bony abnormality. IMPRESSION: No acute disease. Emphysema. Cardiomegaly. Atherosclerosis. Electronically Signed   By: Drusilla Kanner M.D.   On: 11/14/2017 12:10   Ct Head Wo Contrast  Result Date: 11/14/2017 CLINICAL DATA:  Encephalopathy.  Hypoxia.  No  reported injury. EXAM: CT HEAD WITHOUT CONTRAST TECHNIQUE: Contiguous axial images were obtained from the base of the skull  through the vertex without intravenous contrast. COMPARISON:  03/31/2017 head CT. FINDINGS: Brain: No evidence of parenchymal hemorrhage or extra-axial fluid collection. No mass lesion, mass effect, or midline shift. No CT evidence of acute infarction. Generalized cerebral volume loss. Nonspecific mild subcortical and periventricular white matter hypodensity, most in keeping with chronic small vessel ischemic change. No ventriculomegaly. Vascular: No acute abnormality. Skull: No evidence of calvarial fracture. Sinuses/Orbits: The visualized paranasal sinuses are essentially clear. Other:  The mastoid air cells are unopacified. IMPRESSION: 1.  No evidence of acute intracranial abnormality. 2. Generalized cerebral volume loss with mild chronic small vessel ischemic changes in the cerebral white matter. Electronically Signed   By: Delbert Phenix M.D.   On: 11/14/2017 18:39   US Abdomen Limited Ruq  Result Date: 11/14/2017 CLINICAL DATA:  Elevated bilirubin with fever EXAM: ULTRASOUND ABDOMEN LIMITED RIGHT UPPER QUADRANT COMPARISON:  CT 09/27/2016 FINDINGS: Gallbladder: Small amount of tumefactive sludge and stones in the gallbladder. Normal wall thickness. Negative sonographic Murphy. Common bile duct: Diameter: Dilated at 1.6 cm Liver: Heterogeneous with nodular contour suggesting cirrhosis. Anterior cyst measuring 2.9 x 2.6 x 2.6 cm. Portal vein is patent on color Doppler imaging with normal direction of blood flow towards the liver. IMPRESSION: 1. Enlarged extrahepatic common bile duct up to 1.6 cm, could be further evaluated with MRCP. 2. Cirrhotic appearing liver 3. Small amount of sludge and stones without the gallbladder. Negative for acute cholecystitis by sonography. Electronically Signed   By: Jasmine Pang M.D.   On: 11/14/2017 14:20     ASSESSMENT AND PLAN:   77 year old female with past medical history of dementia, hypertension, hyperlipidemia, GERD, history of coronary disease status post bypass  who presented to the hospital due to altered mental status and suspected to have sepsis secondary to UTI.  1.  Altered mental status-this is likely metabolic encephalopathy secondary to underlying sepsis/UTI with underlying dementia. - Continue IV antibiotics with meropenem, CT head was negative for acute pathology.  Follow mental status as sepsis is improving.  2.  Sepsis- patient meets criteria given fever, leukocytosis, abnormal urinalysis consistent with a UTI. - Continue IV meropenem, follow cultures.  Patient's urine cultures are positive for 100,000 colonies of gram-negative rod, blood cultures also positive but identification and sensitivities pending.  3.  Urinary tract infection-source of patient's sepsis.  Continue IV meropenem.  4.  Abnormal right upper quadrant ultrasound- patient had some CBD dilatation. - Patient's LFTs are stable, mild total bili elevation.  Seen by gastroenterology, await MRCP prior to further recommendations.  Continue empiric antibiotics as mentioned above.  5. HTN - cont. Imdur.    6. Glaucoma - cont. Xalantan eye drops.       All the records are reviewed and case discussed with Care Management/Social Worker. Management plans discussed with the patient, family and they are in agreement.  CODE STATUS: DNR   DVT Prophylaxis: Hep. SQ  TOTAL TIME TAKING CARE OF THIS PATIENT: 35 minutes.   POSSIBLE D/C IN 2-3 DAYS, DEPENDING ON CLINICAL CONDITION.   Houston Siren M.D on 11/15/2017 at 2:13 PM  Between 7am to 6pm - Pager - [phone]  After 6pm go to [email or web address] - Social research officer, government  [facility]  Office  [phone]  CC: Primary care physician; System, Pcp Not In

## 2017-11-15 NOTE — Progress Notes (Signed)
PHARMACY - PHYSICIAN COMMUNICATION CRITICAL VALUE ALERT - BLOOD CULTURE IDENTIFICATION (BCID)  Cynthia [name] is an 77 y.o. female who presented to [facility] on 11/14/2017 with a chief complaint of SOB  Assessment:  Tmax 101.3, RR 30's, systolic 90 - 100's, [place] abd: sludge/stones w/o acute cholecystitis, cirrhotic liver appearance, UA leuko +, bacteria many, 1/4 GNR anaerobic BCID Enterobacteriacaea E. Coli KPC-; MRSA PCR + but no concern for pneumonia.  Name of physician (or Provider) Contacted: Oralia Manis  Current antibiotics: Aztreonam, ceftriaxone, metronidazole, vancomycin  Changes to prescribed antibiotics recommended:  Recommendations accepted by provider -- D/c'ing everything and switching to meropenem 1g IV q12h for possible ESBL E. Coli, will de-escalate once cx/sx finalize. Dose adjusted per CrCl 26 - 50 ml/min.  Results for orders placed or performed during the hospital encounter of 11/14/17  Blood Culture ID Panel (Reflexed) (Collected: 11/14/2017 11:34 AM)  Result Value Ref Range   Enterococcus species NOT DETECTED NOT DETECTED   Listeria monocytogenes NOT DETECTED NOT DETECTED   Staphylococcus species NOT DETECTED NOT DETECTED   Staphylococcus aureus (BCID) NOT DETECTED NOT DETECTED   Streptococcus species NOT DETECTED NOT DETECTED   Streptococcus agalactiae NOT DETECTED NOT DETECTED   Streptococcus pneumoniae NOT DETECTED NOT DETECTED   Streptococcus pyogenes NOT DETECTED NOT DETECTED   Acinetobacter baumannii NOT DETECTED NOT DETECTED   Enterobacteriaceae species DETECTED (A) NOT DETECTED   Enterobacter cloacae complex NOT DETECTED NOT DETECTED   Escherichia coli DETECTED (A) NOT DETECTED   Klebsiella oxytoca NOT DETECTED NOT DETECTED   Klebsiella pneumoniae NOT DETECTED NOT DETECTED   Proteus species NOT DETECTED NOT DETECTED   Serratia marcescens NOT DETECTED NOT DETECTED   Carbapenem resistance NOT DETECTED NOT DETECTED   Haemophilus influenzae NOT  DETECTED NOT DETECTED   Neisseria meningitidis NOT DETECTED NOT DETECTED   Pseudomonas aeruginosa NOT DETECTED NOT DETECTED   Candida albicans NOT DETECTED NOT DETECTED   Candida glabrata NOT DETECTED NOT DETECTED   Candida krusei NOT DETECTED NOT DETECTED   Candida parapsilosis NOT DETECTED NOT DETECTED   Candida tropicalis NOT DETECTED NOT DETECTED   Thomasene Ripple, PharmD, BCPS Clinical Pharmacist 11/15/2017

## 2017-11-16 ENCOUNTER — Inpatient Hospital Stay: Payer: Medicare (Managed Care)

## 2017-11-16 LAB — COMPREHENSIVE METABOLIC PANEL
ALBUMIN: 3.2 g/dL — AB (ref 3.5–5.0)
ALK PHOS: 92 U/L (ref 38–126)
ALT: 16 U/L (ref 0–44)
ANION GAP: 9 (ref 5–15)
AST: 27 U/L (ref 15–41)
BUN: 41 mg/dL — ABNORMAL HIGH (ref 8–23)
CALCIUM: 8.8 mg/dL — AB (ref 8.9–10.3)
CHLORIDE: 107 mmol/L (ref 98–111)
CO2: 27 mmol/L (ref 22–32)
Creatinine, Ser: 2.1 mg/dL — ABNORMAL HIGH (ref 0.44–1.00)
GFR calc Af Amer: 25 mL/min — ABNORMAL LOW (ref >60)
GFR calc non Af Amer: 22 mL/min — ABNORMAL LOW (ref >60)
Glucose, Bld: 126 mg/dL — ABNORMAL HIGH (ref 70–99)
POTASSIUM: 3.7 mmol/L (ref 3.5–5.1)
SODIUM: 143 mmol/L (ref 135–145)
Total Bilirubin: 1.1 mg/dL (ref 0.3–1.2)
Total Protein: 6.3 g/dL — ABNORMAL LOW (ref 6.5–8.1)

## 2017-11-16 LAB — URINE CULTURE: Culture: >=100000 — AB

## 2017-11-16 LAB — CBC
HEMATOCRIT: 34.5 % — AB (ref 36.0–46.0)
HEMOGLOBIN: 10.8 g/dL — AB (ref 12.0–15.0)
MCH: 32 pg (ref 26.0–34.0)
MCHC: 31.3 g/dL (ref 30.0–36.0)
MCV: 102.1 fL — AB (ref 80.0–100.0)
Platelets: 94 10*3/uL — ABNORMAL LOW (ref 150–400)
RBC: 3.38 MIL/uL — AB (ref 3.87–5.11)
RDW: 14.6 % (ref 11.5–15.5)
WBC: 5.8 10*3/uL (ref 4.0–10.5)
nRBC: 0 % (ref 0.0–0.2)

## 2017-11-16 LAB — RPR: RPR: NONREACTIVE

## 2017-11-16 MED ORDER — RISPERIDONE 0.5 MG PO TBDP
0.5000 mg | ORAL_TABLET | Freq: Two times a day (BID) | ORAL | Status: DC
  Administered 2017-11-16 – 2017-11-17 (×3): 0.5 mg via ORAL
  Filled 2017-11-16: qty 1
  Filled 2017-11-16: qty 0.5
  Filled 2017-11-16 (×3): qty 1

## 2017-11-16 MED ORDER — SODIUM CHLORIDE 0.9 % IV SOLN
2.0000 g | INTRAVENOUS | Status: DC
  Administered 2017-11-16: 2 g via INTRAVENOUS
  Filled 2017-11-16: qty 2
  Filled 2017-11-16: qty 20

## 2017-11-16 MED ORDER — TRAMADOL HCL 50 MG PO TABS
50.0000 mg | ORAL_TABLET | Freq: Four times a day (QID) | ORAL | Status: DC | PRN
  Administered 2017-11-16: 50 mg via ORAL
  Filled 2017-11-16: qty 1

## 2017-11-16 MED ORDER — FUROSEMIDE 10 MG/ML IJ SOLN
20.0000 mg | Freq: Once | INTRAMUSCULAR | Status: AC
  Administered 2017-11-16: 20 mg via INTRAVENOUS
  Filled 2017-11-16: qty 4

## 2017-11-16 NOTE — Progress Notes (Signed)
Awaiting MRCP to decide on need for ERCP. At this time T bilirubin has normalized indicating no complete blockage of CBD  Dr Wyline Mood MD,MRCP [facility]) Gastroenterology/Hepatology Pager: [phone]

## 2017-11-16 NOTE — Progress Notes (Signed)
PT Cancellation Note  Patient Details Name: Cynthia [name] MRN: [id number] DOB: 1940/05/19   Cancelled Treatment:    Reason Eval/Treat Not Completed: Patient's level of consciousness.  Pt soundly sleeping in bed upon PT arrival (sitter present).  Therapist unable to wake pt with vc's and tactile cues.  Nurse notified.  Will re-attempt PT evaluation tomorrow.  Hendricks Limes, PT 11/16/17, 2:34 PM [phone]

## 2017-11-16 NOTE — Progress Notes (Signed)
[facility] [place] at [facility]      PATIENT NAME: Cynthia [name]    MR#:  [id number]  DATE OF BIRTH:  [birth year]  SUBJECTIVE:   Patient with confusion Received ativan this am   REVIEW OF SYSTEMS:    Review of Systems  Unable to perform ROS: Mental acuity    Nutrition: soft   DRUG ALLERGIES:   Allergies  Allergen Reactions  . Penicillins Swelling and Other (See Comments)    Has patient had a PCN reaction causing immediate rash, facial/tongue/throat swelling, SOB or lightheadedness with hypotension: No Has patient had a PCN reaction causing severe rash involving mucus membranes or skin necrosis: No Has patient had a PCN reaction that required hospitalization: No Has patient had a PCN reaction occurring within the last 10 years: No If all of the above answers are "NO", then may proceed with Cephalosporin use.      VITALS:  Blood pressure 119/70, pulse 69, temperature 98.2 F (36.8 C), temperature source Oral, resp. rate (!) 22, height 6' (1.829 m), weight 90.8 kg, SpO2 96 %.  PHYSICAL EXAMINATION:   Physical Exam  GENERAL:  77 y.o.-year-old patient sitting in chair received ativan for agitation   EYES: Pupils equal, round, reactive to light and accommodation. No scleral icterus. Extraocular muscles intact.  HEENT: Head atraumatic, normocephalic. Oropharynx and nasopharynx clear.  NECK:  Supple, no jugular venous distention. No thyroid enlargement, no tenderness.  LUNGS: Normal respiratory effort with right upper quadrant wheezing CARDIOVASCULAR: S1, S2 normal. No murmurs, rubs, or gallops.  ABDOMEN: Soft,  no rebound, rigidity, nondistended. Bowel sounds present. No organomegaly or mass.  EXTREMITIES: No cyanosis, clubbing or edema b/l.    NEUROLOGIC: Cranial nerves II through XII are intact. No focal Motor or sensory deficits b/l.   PSYCHIATRIC: dementia SKIN: No obvious rash, lesion, or ulcer.    LABORATORY PANEL:   CBC Recent Labs  Lab  11/16/17 0719  WBC 5.8  HGB 10.8*  HCT 34.5*  PLT 94*   ------------------------------------------------------------------------------------------------------------------  Chemistries  Recent Labs  Lab 11/16/17 0719  NA 143  K 3.7  CL 107  CO2 27  GLUCOSE 126*  BUN 41*  CREATININE 2.10*  CALCIUM 8.8*  AST 27  ALT 16  ALKPHOS 92  BILITOT 1.1   ------------------------------------------------------------------------------------------------------------------  Cardiac Enzymes Recent Labs  Lab 11/15/17 0549  TROPONINI 0.06*   ------------------------------------------------------------------------------------------------------------------  RADIOLOGY:  Dg Chest 2 View  Result Date: 11/14/2017 CLINICAL DATA:  Shortness of breath today.  Wheezing. EXAM: CHEST - 2 VIEW COMPARISON:  PA and lateral chest 08/09/2017 06/10/2015. CT chest 06/10/2015. FINDINGS: There is marked cardiomegaly without edema. The lungs are emphysematous but clear. No pneumothorax or pleural fluid. Elevation of the right hemidiaphragm relative to the left noted. Aortic atherosclerosis is seen. The patient is status post CABG. No acute or focal bony abnormality. IMPRESSION: No acute disease. Emphysema. Cardiomegaly. Atherosclerosis. Electronically Signed   By: Drusilla Kanner M.D.   On: 11/14/2017 12:10   Ct Head Wo Contrast  Result Date: 11/14/2017 CLINICAL DATA:  Encephalopathy.  Hypoxia.  No reported injury. EXAM: CT HEAD WITHOUT CONTRAST TECHNIQUE: Contiguous axial images were obtained from the base of the skull through the vertex without intravenous contrast. COMPARISON:  03/31/2017 head CT. FINDINGS: Brain: No evidence of parenchymal hemorrhage or extra-axial fluid collection. No mass lesion, mass effect, or midline shift. No CT evidence of acute infarction. Generalized cerebral volume loss. Nonspecific mild subcortical and periventricular white matter hypodensity, most in keeping  with chronic small  vessel ischemic change. No ventriculomegaly. Vascular: No acute abnormality. Skull: No evidence of calvarial fracture. Sinuses/Orbits: The visualized paranasal sinuses are essentially clear. Other:  The mastoid air cells are unopacified. IMPRESSION: 1.  No evidence of acute intracranial abnormality. 2. Generalized cerebral volume loss with mild chronic small vessel ischemic changes in the cerebral white matter. Electronically Signed   By: Delbert Phenix M.D.   On: 11/14/2017 18:39   US Abdomen Limited Ruq  Result Date: 11/14/2017 CLINICAL DATA:  Elevated bilirubin with fever EXAM: ULTRASOUND ABDOMEN LIMITED RIGHT UPPER QUADRANT COMPARISON:  CT 09/27/2016 FINDINGS: Gallbladder: Small amount of tumefactive sludge and stones in the gallbladder. Normal wall thickness. Negative sonographic Murphy. Common bile duct: Diameter: Dilated at 1.6 cm Liver: Heterogeneous with nodular contour suggesting cirrhosis. Anterior cyst measuring 2.9 x 2.6 x 2.6 cm. Portal vein is patent on color Doppler imaging with normal direction of blood flow towards the liver. IMPRESSION: 1. Enlarged extrahepatic common bile duct up to 1.6 cm, could be further evaluated with MRCP. 2. Cirrhotic appearing liver 3. Small amount of sludge and stones without the gallbladder. Negative for acute cholecystitis by sonography. Electronically Signed   By: Jasmine Pang M.D.   On: 11/14/2017 14:20     ASSESSMENT AND PLAN:   77 year old female with past medical history of dementia, hypertension, hyperlipidemia, GERD, history of coronary disease status post bypass who presented to the hospital due to altered mental status and suspected to have sepsis secondary to UTI.  1.  Acute metabolic encephalopathy due to E. coli bacteremia with underlying dementia   2.  Sepsis: Patient presented with fever, leukocytosis Sepsis due to E. coli bacteremia and UTI  Change antibiotics to Rocephin as per sensitivities.   She will need total of 14 days of  antibiotics.    3.  Abnormal right upper quadrant ultrasound MRCP has been ordered and patient has gone for procedure x2 however due to agitation unable to get an MRCP May try one more time today GI consultation appreciated  4.  Essential HTN: Continue isosorbide  5.  Agitation: Add Risperdal to regimen and psychiatry consultation for further evaluation Continue sitter for safety  6. Glaucoma: continue Xalantan eye drops.   7.  Hypokalemia: Replace and recheck in a.m.    All the records are reviewed and case discussed with Care Management/Social Worker.  CODE STATUS: DNR   DVT Prophylaxis: Hep. SQ  TOTAL TIME TAKING CARE OF THIS PATIENT: 22 minutes.   POSSIBLE D/C IN 2-3 DAYS, DEPENDING ON CLINICAL CONDITION.   Danniel Grenz M.D on 11/16/2017 at 11:13 AM  Between 7am to 6pm - Pager - [phone]  After 6pm go to [email or web address] - Social research officer, government  [facility]  Office  [phone]  CC: Primary care physician; System, Pcp Not In

## 2017-11-16 NOTE — Progress Notes (Signed)
MD aware of xray results. Orders for IV Lasix.

## 2017-11-16 NOTE — Progress Notes (Signed)
PT Cancellation Note  Patient Details Name: Cynthia [name] MRN: [id number] DOB: [birth year]   Cancelled Treatment:    Reason Eval/Treat Not Completed: Patient's level of consciousness. Order received and chart reviewed. Attempted to see patient however upon arrival staff is attempting to clean patient. RN and CNA report that pt has been aggressive this morning and is not following commands currently. RN states that pt is not currently appropriate for PT evaluation. Will hold PT evaluation and attempt to perform once pt is appropriate.  Sharalyn Ink Huprich PT, DPT, GCS  Huprich,Jason 11/16/2017, 11:41 AM

## 2017-11-16 NOTE — Consult Note (Signed)
Psychiatry: Brief note full note to follow.  Elderly woman in the hospital with infections and medical problems with a known history of dementia.  Chart indicates recent delirium.  Patient was asleep and could not be aroused to have any conversation.  Current medicine regimen looks like is pretty good.  Generally antipsychotics like Haldol and Risperdal are more effective in controlling Librium in the hospital whereas benzodiazepines frequently make things worse.  I will continue to follow up.

## 2017-11-17 LAB — BASIC METABOLIC PANEL
Anion gap: 11 (ref 5–15)
BUN: 43 mg/dL — ABNORMAL HIGH (ref 8–23)
CHLORIDE: 107 mmol/L (ref 98–111)
CO2: 26 mmol/L (ref 22–32)
Calcium: 8.7 mg/dL — ABNORMAL LOW (ref 8.9–10.3)
Creatinine, Ser: 1.76 mg/dL — ABNORMAL HIGH (ref 0.44–1.00)
GFR, EST AFRICAN AMERICAN: 31 mL/min — AB (ref >60)
GFR, EST NON AFRICAN AMERICAN: 27 mL/min — AB (ref >60)
Glucose, Bld: 119 mg/dL — ABNORMAL HIGH (ref 70–99)
Potassium: 3.6 mmol/L (ref 3.5–5.1)
Sodium: 144 mmol/L (ref 135–145)

## 2017-11-17 MED ORDER — CEPHALEXIN 500 MG PO CAPS
500.0000 mg | ORAL_CAPSULE | Freq: Four times a day (QID) | ORAL | Status: DC
  Administered 2017-11-17: 500 mg via ORAL
  Filled 2017-11-17: qty 1

## 2017-11-17 MED ORDER — CEPHALEXIN 500 MG PO CAPS: 500.0000 mg | ORAL_CAPSULE | Freq: Three times a day (TID) | ORAL | 0 refills | Status: AC

## 2017-11-17 NOTE — Progress Notes (Signed)
Gave repot to [place] at [facility]

## 2017-11-17 NOTE — NC FL2 (Signed)
[place] MEDICAID FL2 LEVEL OF CARE SCREENING TOOL     IDENTIFICATION  Patient Name: Cynthia [name] Birthdate: 1940-07-08 Sex: female Admission Date (Current Location): 11/14/2017  [place] and [place] Number:  Chiropodist and Address:  [facility], [address] [place], [place] [address]      Provider Number: [id number]  Attending Physician Name and Address:  Adrian Saran, MD  Relative Name and Phone Number:       Current Level of Care: Hospital Recommended Level of Care: Other (Comment)(Nursing Home for Respite Care) Prior Approval Number:    Date Approved/Denied:   PASRR Number:    Discharge Plan: (Nursing Home for Respite Care)    Current Diagnoses: Patient Active Problem List   Diagnosis Date Noted  . Sepsis (HCC) 11/14/2017  . CHF (congestive heart failure) (HCC) 08/09/2017  . COPD exacerbation (HCC) 03/26/2017  . Pelvic fracture (HCC) 04/30/2016  . HTN (hypertension) 04/30/2016  . HLD (hyperlipidemia) 04/30/2016  . GERD (gastroesophageal reflux disease) 04/30/2016  . CAD (coronary artery disease) 04/30/2016  . Chest pain 09/08/2014  . Dehydration 09/08/2014    Orientation RESPIRATION BLADDER Height & Weight     Self  Normal, O2(4 liters) Incontinent Weight: 207 lb 3.7 oz (94 kg) Height:  6' (182.9 cm)  BEHAVIORAL SYMPTOMS/MOOD NEUROLOGICAL BOWEL NUTRITION STATUS  (none) (none) Incontinent Diet(dysphagia 3)  AMBULATORY STATUS COMMUNICATION OF NEEDS Skin   Total Care Verbally Normal                       Personal Care Assistance Level of Assistance  Bathing, Feeding, Dressing, Total care Bathing Assistance: Maximum assistance Feeding assistance: Maximum assistance Dressing Assistance: Maximum assistance Total Care Assistance: Maximum assistance   Functional Limitations Info  Sight Sight Info: Impaired        SPECIAL CARE FACTORS FREQUENCY                       Contractures Contractures  Info: Not present    Additional Factors Info  Code Status Code Status Info: dnr             Current Medications (11/17/2017):  This is the current hospital active medication list Current Facility-Administered Medications  Medication Dose Route Frequency Provider Last Rate Last Dose  . 0.9 %  sodium chloride infusion   Intravenous PRN Houston Siren, MD   Stopped at 11/16/17 1411  . acetaminophen (TYLENOL) tablet 650 mg  650 mg Oral Q6H PRN Salary, Jetty Duhamel D, MD   650 mg at 11/16/17 0606   Or  . acetaminophen (TYLENOL) suppository 650 mg  650 mg Rectal Q6H PRN Salary, Montell D, MD      . arformoterol (BROVANA) nebulizer solution 15 mcg  15 mcg Nebulization BID Angelina Ok D, MD   15 mcg at 11/17/17 0736  . aspirin EC tablet 81 mg  81 mg Oral Daily Salary, Montell D, MD   81 mg at 11/17/17 1032  . benzonatate (TESSALON) capsule 100 mg  100 mg Oral TID PRN Salary, Montell D, MD      . cephALEXin (KEFLEX) capsule 500 mg  500 mg Oral QID Adrian Saran, MD   500 mg at 11/17/17 1032  . Chlorhexidine Gluconate Cloth 2 % PADS 6 each  6 each Topical Q0600 Houston Siren, MD   6 each at 11/16/17 [phone]  . feeding supplement (BOOST / RESOURCE BREEZE) liquid 1 Container  1 Container Oral  TID BM Houston Siren, MD   1 Container at 11/17/17 1033  . haloperidol lactate (HALDOL) injection 5 mg  5 mg Intravenous Q6H PRN Houston Siren, MD   5 mg at 11/17/17 0701  . heparin injection 5,000 Units  5,000 Units Subcutaneous Q8H Salary, Jetty Duhamel D, MD   5,000 Units at 11/16/17 0508  . ipratropium-albuterol (DUONEB) 0.5-2.5 (3) MG/3ML nebulizer solution 3 mL  3 mL Nebulization Q6H PRN Salary, Montell D, MD   3 mL at 11/17/17 0736  . isosorbide mononitrate (IMDUR) 24 hr tablet 30 mg  30 mg Oral Daily Salary, Montell D, MD   30 mg at 11/17/17 1032  . latanoprost (XALATAN) 0.005 % ophthalmic solution 1 drop  1 drop Both Eyes QHS Salary, Montell D, MD   1 drop at 11/16/17 2124  . LORazepam (ATIVAN)  injection 1 mg  1 mg Intravenous Q4H PRN Salary, Montell D, MD   1 mg at 11/16/17 0601  . multivitamin with minerals tablet 1 tablet  1 tablet Oral Daily Houston Siren, MD   1 tablet at 11/17/17 1032  . mupirocin ointment (BACTROBAN) 2 % 1 application  1 application Nasal BID Houston Siren, MD   1 application at 11/16/17 2124  . nitroGLYCERIN (NITROSTAT) SL tablet 0.4 mg  0.4 mg Sublingual Q5 min PRN Salary, Montell D, MD      . ondansetron (ZOFRAN) tablet 4 mg  4 mg Oral Q6H PRN Salary, Montell D, MD       Or  . ondansetron (ZOFRAN) injection 4 mg  4 mg Intravenous Q6H PRN Salary, Montell D, MD   4 mg at 11/15/17 1252  . polyethylene glycol (MIRALAX / GLYCOLAX) packet 17 g  17 g Oral Daily PRN Salary, Montell D, MD      . potassium chloride SA (K-DUR,KLOR-CON) CR tablet 10 mEq  10 mEq Oral Daily Salary, Montell D, MD   10 mEq at 11/17/17 1032  . risperiDONE (RISPERDAL M-TABS) disintegrating tablet 0.5 mg  0.5 mg Oral BID Adrian Saran, MD   0.5 mg at 11/16/17 2124  . senna-docusate (Senokot-S) tablet 1 tablet  1 tablet Oral Daily Salary, Montell D, MD   1 tablet at 11/17/17 1032  . traMADol (ULTRAM) tablet 50 mg  50 mg Oral Q6H PRN Adrian Saran, MD   50 mg at 11/16/17 1243     Discharge Medications: Please see discharge summary for a list of discharge medications.  Relevant Imaging Results:  Relevant Lab Results:   Additional Information ss: [id number]  York Spaniel, LCSW

## 2017-11-17 NOTE — Clinical Social Work Placement (Signed)
   CLINICAL SOCIAL WORK PLACEMENT  NOTE  Date:  11/17/2017  Patient Details  Name: Cynthia [name] MRN: [id number] Date of Birth: 1940/03/06  Clinical Social Work is seeking post-discharge placement for this patient at the Skilled  Nursing Facility level of care (*CSW will initial, date and re-position this form in  chart as items are completed):  Yes   Patient/family provided with [facility] Clinical Social Work Department's list of facilities offering this level of care within the geographic area requested by the patient (or if unable, by the patient's family).  No([facility] recipient)   Patient/family informed of their freedom to choose among providers that offer the needed level of care, that participate in Medicare, Medicaid or managed care program needed by the patient, have an available bed and are willing to accept the patient.  No([facility] recipient)   Patient/family informed of [facility]'s ownership interest in [facility] and [facility], as well as of the fact that they are under no obligation to receive care at these facilities.  PASRR submitted to EDS on       PASRR number received on       Existing PASRR number confirmed on 11/17/17     FL2 transmitted to all facilities in geographic area requested by pt/family on 11/17/17(to [facility])     FL2 transmitted to all facilities within larger geographic area on       Patient informed that his/her managed care company has contracts with or will negotiate with certain facilities, including the following:        Yes   Patient/family informed of bed offers received.  Patient chooses bed at ([facility])     Physician recommends and patient chooses bed at [facility])    Patient to be transferred to [facility]) on 11/17/17.  Patient to be transferred to facility by ([facility])     Patient family notified on 11/17/17 of transfer.  Name of family member notified:        PHYSICIAN       Additional Comment:     _______________________________________________ York Spaniel, LCSW 11/17/2017, 2:09 PM

## 2017-11-17 NOTE — Discharge Summary (Signed)
[facility] [place] [facility]   PATIENT NAME: Cynthia [name]    MR#:  [id number]  DATE OF BIRTH:  1940/07/01  DATE OF ADMISSION:  11/14/2017 ADMITTING PHYSICIAN: Bertrum Sol, MD  DATE OF DISCHARGE: November 17, 2017  PRIMARY CARE PHYSICIAN: [facility]   ADMISSION DIAGNOSIS:  Hyperbilirubinemia [E80.6] Urinary tract infection without hematuria, site unspecified [N39.0] Sepsis, due to unspecified organism, unspecified whether acute organ dysfunction present (HCC) [A41.9]  DISCHARGE DIAGNOSIS:  Active Problems:   Sepsis (HCC)   SECONDARY DIAGNOSIS:   Past Medical History:  Diagnosis Date  . Blood clot in vein   . Coronary artery disease   . GERD (gastroesophageal reflux disease)   . Heart attack (HCC)   . Hx of CABG 2013  . Hyperlipidemia   . Hypertension     HOSPITAL COURSE:   77 year old female with past medical history of dementia, hypertension, hyperlipidemia, GERD, history of coronary disease status post bypass who presented to the hospital due to altered mental status and suspected to have sepsis secondary to UTI.  1.  Acute metabolic encephalopathy due to E. coli bacteremia with underlying dementia  Patient is at her baseline 2.  Sepsis: Patient presented with fever, leukocytosis Sepsis was due to E. coli bacteremia and UTI  Discharged on oral Keflex as per sensitivities for 12 more days.  Case discussed with [facility] PCP at [facility]    3.  Abnormal right upper quadrant ultrasound Was evaluated GI due to abnormal right upper quadrant ultrasound concerning for Enlarged extrahepatic common bile duct up to 1.6 cm. She was unable to obtain MRCP due to her agitation and dementia.  Her LFTs are improving.  She has been afebrile.  Case discussed with GI.  At this time we will not further investigate as patient is asymptomatic and tolerating her diet.  4.  Essential HTN: He will continue isosorbide  5 Glaucoma: continue Xalantan eye drops.    6  Hypokalemia: Was repleted.  7.  Dementia: Patient is at her baseline  Patient will benefit from outpatient palliative care services  Case discussed with [facility] physician   DISCHARGE CONDITIONS AND DIET:   Guarded condition Diet as tolerated Aspiration precautions  CONSULTS OBTAINED:  Treatment Team:  Wyline Mood, MD Shari Prows, MD Clapacs, Jackquline Denmark, MD  DRUG ALLERGIES:   Allergies  Allergen Reactions  . Penicillins Swelling and Other (See Comments)    Has patient had a PCN reaction causing immediate rash, facial/tongue/throat swelling, SOB or lightheadedness with hypotension: No Has patient had a PCN reaction causing severe rash involving mucus membranes or skin necrosis: No Has patient had a PCN reaction that required hospitalization: No Has patient had a PCN reaction occurring within the last 10 years: No If all of the above answers are "NO", then may proceed with Cephalosporin use.      DISCHARGE MEDICATIONS:   Allergies as of 11/17/2017      Reactions   Penicillins Swelling, Other (See Comments)   Has patient had a PCN reaction causing immediate rash, facial/tongue/throat swelling, SOB or lightheadedness with hypotension: No Has patient had a PCN reaction causing severe rash involving mucus membranes or skin necrosis: No Has patient had a PCN reaction that required hospitalization: No Has patient had a PCN reaction occurring within the last 10 years: No If all of the above answers are "NO", then may proceed with Cephalosporin use.        Medication List    TAKE  these medications   aspirin EC 81 MG tablet Take 81 mg by mouth daily.   benzonatate 100 MG capsule Commonly known as:  TESSALON Take 100 mg by mouth 3 (three) times daily as needed for cough.   cephALEXin 500 MG capsule Commonly known as:  KEFLEX Take 1 capsule (500 mg total) by mouth 3 (three) times daily for 12 days.   furosemide 80 MG tablet Commonly known as:   LASIX Take 80 mg by mouth daily.   HYDROcodone-acetaminophen 5-325 MG tablet Commonly known as:  NORCO/VICODIN Take 1 tablet by mouth 2 (two) times daily as needed for moderate pain.   ipratropium-albuterol 0.5-2.5 (3) MG/3ML Soln Commonly known as:  DUONEB Take 3 mLs by nebulization every 6 (six) hours as needed (for shortness of breath/wheezing).   isosorbide mononitrate 30 MG 24 hr tablet Commonly known as:  IMDUR Take 30 mg by mouth daily.   latanoprost 0.005 % ophthalmic solution Commonly known as:  XALATAN Place 1 drop into both eyes at bedtime.   LORazepam 2 MG/ML concentrated solution Commonly known as:  ATIVAN Take 0.5 mg by mouth every 2 (two) hours as needed for anxiety (agitation).   morphine CONCENTRATE 10 mg / 0.5 ml concentrated solution Place 4 mg under the tongue every 2 (two) hours as needed for severe pain.   nitroGLYCERIN 0.4 MG SL tablet Commonly known as:  NITROSTAT Place 0.4 mg under the tongue every 5 (five) minutes as needed for chest pain.   potassium chloride 10 MEQ tablet Commonly known as:  K-DUR,KLOR-CON Take 10 mEq by mouth daily.   salmeterol 50 MCG/DOSE diskus inhaler Commonly known as:  SEREVENT Inhale 1 puff into the lungs 2 (two) times daily.   sennosides-docusate sodium 8.6-50 MG tablet Commonly known as:  SENOKOT-S Take 1 tablet by mouth daily.         Today   CHIEF COMPLAINT:   No acute events overnight   VITAL SIGNS:  Blood pressure 109/67, pulse 63, temperature 97.9 F (36.6 C), resp. rate 20, height 6' (1.829 m), weight 94 kg, SpO2 99 %.   REVIEW OF SYSTEMS:  Review of Systems  Unable to perform ROS: Dementia     PHYSICAL EXAMINATION:  GENERAL:  77 y.o.-year-old patient lying in the bed with no acute distress.  NECK:  Supple, no jugular venous distention. No thyroid enlargement, no tenderness.  LUNGS: Normal breath sounds bilaterally, no wheezing, rales,rhonchi  No use of accessory muscles of respiration.   CARDIOVASCULAR: S1, S2 normal. No murmurs, rubs, or gallops.  ABDOMEN: Soft, non-tender, non-distended. Bowel sounds present. No organomegaly or mass.  EXTREMITIES: No pedal edema, cyanosis, or clubbing.  PSYCHIATRIC: The patient is alert and oriented x 1 SKIN: No obvious rash, lesion, or ulcer.   DATA REVIEW:   CBC Recent Labs  Lab 11/16/17 0719  WBC 5.8  HGB 10.8*  HCT 34.5*  PLT 94*    Chemistries  Recent Labs  Lab 11/16/17 0719 11/17/17 0624  NA 143 144  K 3.7 3.6  CL 107 107  CO2 27 26  GLUCOSE 126* 119*  BUN 41* 43*  CREATININE 2.10* 1.76*  CALCIUM 8.8* 8.7*  AST 27  --   ALT 16  --   ALKPHOS 92  --   BILITOT 1.1  --     Cardiac Enzymes Recent Labs  Lab 11/14/17 1838 11/14/17 2333 11/15/17 0549  TROPONINI 0.06* 0.06* 0.06*    Microbiology Results  @MICRORSLT48 @  RADIOLOGY:  Dg Chest 1 View  Result Date: 11/16/2017 CLINICAL DATA:  Wheezing. EXAM: CHEST  1 VIEW COMPARISON:  Chest x-ray dated September 14, 2017. FINDINGS: Stable cardiomegaly status post CABG. Pulmonary vascular congestion without overt edema. No focal consolidation, pleural effusion, or pneumothorax. No acute osseous abnormality. IMPRESSION: Stable cardiomegaly and pulmonary vascular congestion without overt edema. Electronically Signed   By: Obie Dredge M.D.   On: 11/16/2017 13:46      Allergies as of 11/17/2017      Reactions   Penicillins Swelling, Other (See Comments)   Has patient had a PCN reaction causing immediate rash, facial/tongue/throat swelling, SOB or lightheadedness with hypotension: No Has patient had a PCN reaction causing severe rash involving mucus membranes or skin necrosis: No Has patient had a PCN reaction that required hospitalization: No Has patient had a PCN reaction occurring within the last 10 years: No If all of the above answers are "NO", then may proceed with Cephalosporin use.        Medication List    TAKE these medications   aspirin EC 81  MG tablet Take 81 mg by mouth daily.   benzonatate 100 MG capsule Commonly known as:  TESSALON Take 100 mg by mouth 3 (three) times daily as needed for cough.   cephALEXin 500 MG capsule Commonly known as:  KEFLEX Take 1 capsule (500 mg total) by mouth 3 (three) times daily for 12 days.   furosemide 80 MG tablet Commonly known as:  LASIX Take 80 mg by mouth daily.   HYDROcodone-acetaminophen 5-325 MG tablet Commonly known as:  NORCO/VICODIN Take 1 tablet by mouth 2 (two) times daily as needed for moderate pain.   ipratropium-albuterol 0.5-2.5 (3) MG/3ML Soln Commonly known as:  DUONEB Take 3 mLs by nebulization every 6 (six) hours as needed (for shortness of breath/wheezing).   isosorbide mononitrate 30 MG 24 hr tablet Commonly known as:  IMDUR Take 30 mg by mouth daily.   latanoprost 0.005 % ophthalmic solution Commonly known as:  XALATAN Place 1 drop into both eyes at bedtime.   LORazepam 2 MG/ML concentrated solution Commonly known as:  ATIVAN Take 0.5 mg by mouth every 2 (two) hours as needed for anxiety (agitation).   morphine CONCENTRATE 10 mg / 0.5 ml concentrated solution Place 4 mg under the tongue every 2 (two) hours as needed for severe pain.   nitroGLYCERIN 0.4 MG SL tablet Commonly known as:  NITROSTAT Place 0.4 mg under the tongue every 5 (five) minutes as needed for chest pain.   potassium chloride 10 MEQ tablet Commonly known as:  K-DUR,KLOR-CON Take 10 mEq by mouth daily.   salmeterol 50 MCG/DOSE diskus inhaler Commonly known as:  SEREVENT Inhale 1 puff into the lungs 2 (two) times daily.   sennosides-docusate sodium 8.6-50 MG tablet Commonly known as:  SENOKOT-S Take 1 tablet by mouth daily.         Stable for discharge   Patient should follow up with PCP  CODE STATUS:     Code Status Orders  (From admission, onward)         Start     Ordered   11/14/17 1728  Do not attempt resuscitation (DNR)  Continuous    Question Answer  Comment  In the event of cardiac or respiratory ARREST Do not call a "code blue"   In the event of cardiac or respiratory ARREST Do not perform Intubation, CPR, defibrillation or ACLS   In the event of cardiac or respiratory ARREST Use medication by any route,  position, wound care, and other measures to relive pain and suffering. May use oxygen, suction and manual treatment of airway obstruction as needed for comfort.   Comments Nurse may pronounce      11/14/17 1729        Code Status History    Date Active Date Inactive Code Status Order ID Comments User Context   08/09/2017 1452 08/10/2017 1823 Full Code [id number]  Enedina Finner, MD Inpatient   07/05/2017 0250 07/06/2017 1714 Full Code [id number]  Barbaraann Rondo, MD Inpatient   03/26/2017 2049 03/28/2017 1546 Full Code [id number]  Alford Highland, MD ED   04/30/2016 0133 05/01/2016 1526 Full Code [id number]  Oralia Manis, MD ED   09/08/2014 1719 09/09/2014 2011 Full Code [id number]  Shaune Pollack, MD Inpatient    Advance Directive Documentation     Most Recent Value  Type of Advance Directive  Living will, Healthcare Power of Attorney  Pre-existing out of facility DNR order (yellow form or pink MOST form)  -  "MOST" Form in Place?  -      TOTAL TIME TAKING CARE OF THIS PATIENT: 38 minutes.    Note: This dictation was prepared with Dragon dictation along with smaller phrase technology. Any transcriptional errors that result from this process are unintentional.  Floyed Masoud M.D on 11/17/2017 at 10:06 AM  Between 7am to 6pm - Pager - [phone] After 6pm go to [email or web address] - Social research officer, government  [facility]  Office  [phone]  CC: Primary care physician; [facility]

## 2017-11-17 NOTE — Clinical Social Work Note (Signed)
Clinical Social Work Assessment  Patient Details  Name: Cynthia [name] MRN: [id number] Date of Birth: 04/27/1940  Date of referral:  11/17/17               Reason for consult:  Facility Placement                Permission sought to share information with:    Permission granted to share information::     Name::        Agency::     Relationship::     Contact Information:     Housing/Transportation Living arrangements for the past 2 months:  Single Family Home Source of Information:  ([facility] Nurse: Olegario Messier) Patient Interpreter Needed:  None Criminal Activity/Legal Involvement Pertinent to Current Situation/Hospitalization:  No - Comment as needed Significant Relationships:  Spouse, Adult Children Lives with:  Spouse Do you feel safe going back to the place where you live?  Yes Need for family participation in patient care:  Yes (Comment)  Care giving concerns:  Patient is from home with her husband.    Social Worker assessment / plan:  [facility] is followed by [facility]. On day of discharge, [facility] rep: Olegario Messier asked CSW to place patient at [facility] for respite. CSW confirmed the respite placement with Tiffany at [facility]. Patient's son to do admission paperwork. Patient's husband is having shoulder surgery today.   Employment status:    Insurance information:    PT Recommendations:    Information / Referral to community resources:     Patient/Family's Response to care:  Son and husband agreeable for [facility] to place patient at [facility].   Patient/Family's Understanding of and Emotional Response to Diagnosis, Current Treatment, and Prognosis:  Family is grateful for alternate living arrangements at this time.   Emotional Assessment Appearance:  Appears stated age Attitude/Demeanor/Rapport:  (confused x4) Affect (typically observed):  Calm Orientation:  (confused X4) Alcohol / Substance use:  Not Applicable Psych involvement (Current and /or in the community):  Yes (Comment)(seen as  inpatient for acute confusion)  Discharge Needs  Concerns to be addressed:  Care Coordination Readmission within the last 30 days:  No Current discharge risk:  None Barriers to Discharge:  No Barriers Identified   York Spaniel, LCSW 11/17/2017, 2:06 PM

## 2017-11-19 LAB — CULTURE, BLOOD (ROUTINE X 2): SPECIAL REQUESTS: ADEQUATE

## 2018-07-16 ENCOUNTER — Other Ambulatory Visit: Payer: Self-pay

## 2018-07-16 ENCOUNTER — Emergency Department
Admission: EM | Admit: 2018-07-16 | Discharge: 2018-07-16 | Disposition: A | Discharge status: Home / Self Care | Payer: Medicare (Managed Care) | Attending: Emergency Medicine | Admitting: Emergency Medicine

## 2018-07-16 ENCOUNTER — Emergency Department: Payer: Medicare (Managed Care)

## 2018-07-16 DIAGNOSIS — S8002XA Contusion of left knee, initial encounter: Secondary | ICD-10-CM

## 2018-07-16 DIAGNOSIS — Y929 Unspecified place or not applicable: Secondary | ICD-10-CM | POA: Diagnosis not present

## 2018-07-16 DIAGNOSIS — J449 Chronic obstructive pulmonary disease, unspecified: Secondary | ICD-10-CM | POA: Insufficient documentation

## 2018-07-16 DIAGNOSIS — F17210 Nicotine dependence, cigarettes, uncomplicated: Secondary | ICD-10-CM | POA: Diagnosis not present

## 2018-07-16 DIAGNOSIS — S51012A Laceration without foreign body of left elbow, initial encounter: Secondary | ICD-10-CM

## 2018-07-16 DIAGNOSIS — I2510 Atherosclerotic heart disease of native coronary artery without angina pectoris: Secondary | ICD-10-CM | POA: Diagnosis not present

## 2018-07-16 DIAGNOSIS — Z951 Presence of aortocoronary bypass graft: Secondary | ICD-10-CM | POA: Diagnosis not present

## 2018-07-16 DIAGNOSIS — S59902A Unspecified injury of left elbow, initial encounter: Secondary | ICD-10-CM | POA: Diagnosis present

## 2018-07-16 DIAGNOSIS — S61012A Laceration without foreign body of left thumb without damage to nail, initial encounter: Secondary | ICD-10-CM | POA: Insufficient documentation

## 2018-07-16 DIAGNOSIS — Y939 Activity, unspecified: Secondary | ICD-10-CM | POA: Diagnosis not present

## 2018-07-16 DIAGNOSIS — Y998 Other external cause status: Secondary | ICD-10-CM | POA: Diagnosis not present

## 2018-07-16 DIAGNOSIS — S5002XA Contusion of left elbow, initial encounter: Secondary | ICD-10-CM

## 2018-07-16 DIAGNOSIS — I10 Essential (primary) hypertension: Secondary | ICD-10-CM | POA: Diagnosis not present

## 2018-07-16 DIAGNOSIS — W19XXXA Unspecified fall, initial encounter: Secondary | ICD-10-CM | POA: Insufficient documentation

## 2018-07-16 LAB — CBC WITH DIFFERENTIAL/PLATELET
Abs Immature Granulocytes: 0 10*3/uL (ref 0.00–0.07)
Basophils Absolute: 0 10*3/uL (ref 0.0–0.1)
Basophils Relative: 1 %
Eosinophils Absolute: 0.1 10*3/uL (ref 0.0–0.5)
Eosinophils Relative: 4 %
HCT: 38.8 % (ref 36.0–46.0)
Hemoglobin: 12.1 g/dL (ref 12.0–15.0)
Immature Granulocytes: 0 %
Lymphocytes Relative: 37 %
Lymphs Abs: 1.1 10*3/uL (ref 0.7–4.0)
MCH: 33.5 pg (ref 26.0–34.0)
MCHC: 31.2 g/dL (ref 30.0–36.0)
MCV: 107.5 fL — ABNORMAL HIGH (ref 80.0–100.0)
Monocytes Absolute: 0.4 10*3/uL (ref 0.1–1.0)
Monocytes Relative: 13 %
Neutro Abs: 1.3 10*3/uL — ABNORMAL LOW (ref 1.7–7.7)
Neutrophils Relative %: 45 %
Platelets: 133 10*3/uL — ABNORMAL LOW (ref 150–400)
RBC: 3.61 MIL/uL — ABNORMAL LOW (ref 3.87–5.11)
RDW: 12.6 % (ref 11.5–15.5)
WBC: 3 10*3/uL — ABNORMAL LOW (ref 4.0–10.5)
nRBC: 0 % (ref 0.0–0.2)

## 2018-07-16 LAB — COMPREHENSIVE METABOLIC PANEL
ALT: 13 U/L (ref 0–44)
AST: 23 U/L (ref 15–41)
Albumin: 4.4 g/dL (ref 3.5–5.0)
Alkaline Phosphatase: 118 U/L (ref 38–126)
Anion gap: 10 (ref 5–15)
BUN: 23 mg/dL (ref 8–23)
CO2: 28 mmol/L (ref 22–32)
Calcium: 9 mg/dL (ref 8.9–10.3)
Chloride: 104 mmol/L (ref 98–111)
Creatinine, Ser: 1.59 mg/dL — ABNORMAL HIGH (ref 0.44–1.00)
GFR calc Af Amer: 36 mL/min — ABNORMAL LOW (ref >60)
GFR calc non Af Amer: 31 mL/min — ABNORMAL LOW (ref >60)
Glucose, Bld: 87 mg/dL (ref 70–99)
Potassium: 4.1 mmol/L (ref 3.5–5.1)
Sodium: 142 mmol/L (ref 135–145)
Total Bilirubin: 0.7 mg/dL (ref 0.3–1.2)
Total Protein: 7.3 g/dL (ref 6.5–8.1)

## 2018-07-16 LAB — TROPONIN I
Troponin I: 0.03 ng/mL (ref <0.03)
Troponin I: 0.03 ng/mL (ref <0.03)

## 2018-07-16 MED ORDER — LIDOCAINE HCL (PF) 1 % IJ SOLN
5.0000 mL | Freq: Once | INTRAMUSCULAR | Status: AC
  Administered 2018-07-16: 5 mL

## 2018-07-16 MED ORDER — LIDOCAINE HCL (PF) 1 % IJ SOLN
INTRAMUSCULAR | Status: AC
  Administered 2018-07-16: 5 mL
  Filled 2018-07-16: qty 5

## 2018-07-16 NOTE — ED Provider Notes (Signed)
Cynthia [name]   ____________________________________________   First MD Initiated Contact with Patient 07/16/18 0805     (approximate)  I have reviewed the triage vital signs and the nursing notes.   HISTORY  Chief Complaint Fall    HPI Cynthia [name] is a 78 y.o. female patient presents with left elbow and left knee pain secondary to multiple falls today.  Patient also has a laceration to the left elbow.  Per husband there was no loss of consciousness or head injury.  Patient has dementia and history is given by husband.         Past Medical History:  Diagnosis Date  . Blood clot in vein   . Coronary artery disease   . GERD (gastroesophageal reflux disease)   . Heart attack (HCC)   . Hx of CABG 2013  . Hyperlipidemia   . Hypertension     Patient Active Problem List   Diagnosis Date Noted  . Sepsis (HCC) 11/14/2017  . CHF (congestive heart failure) (HCC) 08/09/2017  . COPD exacerbation (HCC) 03/26/2017  . Pelvic fracture (HCC) 04/30/2016  . HTN (hypertension) 04/30/2016  . HLD (hyperlipidemia) 04/30/2016  . GERD (gastroesophageal reflux disease) 04/30/2016  . CAD (coronary artery disease) 04/30/2016  . Chest pain 09/08/2014  . Dehydration 09/08/2014    Past Surgical History:  Procedure Laterality Date  . CARDIAC SURGERY    . HIP SURGERY Left 12/2014  . SHOULDER SURGERY Bilateral     Prior to Admission medications   Medication Sig Start Date End Date Taking? Authorizing Provider  aspirin EC 81 MG tablet Take 81 mg by mouth daily.    [provider]  benzonatate (TESSALON) 100 MG capsule Take 100 mg by mouth 3 (three) times daily as needed for cough.    [provider]  furosemide (LASIX) 80 MG tablet Take 80 mg by mouth daily.    [provider]  HYDROcodone-acetaminophen (NORCO/VICODIN) 5-325 MG tablet Take 1 tablet by mouth 2 (two) times daily as needed for  moderate pain.    [provider]  ipratropium-albuterol (DUONEB) 0.5-2.5 (3) MG/3ML SOLN Take 3 mLs by nebulization every 6 (six) hours as needed (for shortness of breath/wheezing).     [provider]  isosorbide mononitrate (IMDUR) 30 MG 24 hr tablet Take 30 mg by mouth daily.    [provider]  latanoprost (XALATAN) 0.005 % ophthalmic solution Place 1 drop into both eyes at bedtime.    [provider]  LORazepam (ATIVAN) 2 MG/ML concentrated solution Take 0.5 mg by mouth every 2 (two) hours as needed for anxiety (agitation).    [provider]  Morphine Sulfate (MORPHINE CONCENTRATE) 10 mg / 0.5 ml concentrated solution Place 4 mg under the tongue every 2 (two) hours as needed for severe pain.    [provider]  nitroGLYCERIN (NITROSTAT) 0.4 MG SL tablet Place 0.4 mg under the tongue every 5 (five) minutes as needed for chest pain.    [provider]  potassium chloride (K-DUR,KLOR-CON) 10 MEQ tablet Take 10 mEq by mouth daily.    [provider]  salmeterol (SEREVENT) 50 MCG/DOSE diskus inhaler Inhale 1 puff into the lungs 2 (two) times daily.    [provider]  sennosides-docusate sodium (SENOKOT-S) 8.6-50 MG tablet Take 1 tablet by mouth daily.    [provider]    Allergies Penicillins  Family History  Problem Relation Age of Onset  . Hypertension Other   .  Alcohol abuse Other     Social History Social History   Tobacco Use  . Smoking status: Current Every Day Smoker    Packs/day: 0.10    Years: 35.00    Pack years: 3.50    Types: Cigarettes  . Smokeless tobacco: Never Used  Substance Use Topics  . Alcohol use: No  . Drug use: No    Review of Systems  Constitutional: No fever/chills Eyes: No visual changes. ENT: No sore throat. Cardiovascular: Denies chest pain. Respiratory: Denies shortness of breath. Gastrointestinal: No abdominal pain.  No nausea, no vomiting.  No  diarrhea.  No constipation. Genitourinary: Negative for dysuria. Musculoskeletal left elbow and left knee pain.   Skin: Negative for rash.  Laceration left elbow. Neurological: Negative for headaches, focal weakness or numbness. Psychiatric:  Dementia.   Endocrine:  Hypertension hyperlipidemia. Allergic/Immunilogical: Penicillin ____________________________________________   PHYSICAL EXAM:  VITAL SIGNS: ED Triage Vitals  Enc Vitals Group     BP 07/16/18 0055 (!) 129/52     Pulse Rate 07/16/18 0055 61     Resp 07/16/18 0055 16     Temp 07/16/18 0055 98.9 F (37.2 C)     Temp Source 07/16/18 0055 Oral     SpO2 07/16/18 0055 96 %     Weight 07/16/18 0056 202 lb (91.6 kg)     Height 07/16/18 0056 6' (1.829 m)     Head Circumference --      Peak Flow --      Pain Score --      Pain Loc --      Pain Edu? --      Excl. in [place]? --    Constitutional: Alert and oriented. Well appearing and in no acute distress. Head: Atraumatic. Neck: No stridor. No cervical spine tenderness to palpation. Cardiovascular: Normal rate, regular rhythm. Grossly normal heart sounds.  Good peripheral circulation. Respiratory: Normal respiratory effort.  No retractions. Lungs CTAB. Gastrointestinal: Soft and nontender. No distention. No abdominal bruits. No CVA tenderness. Musculoskeletal: No lower extremity tenderness nor edema.  No joint effusions. Neurologic:  Normal speech and language. No gross focal neurologic deficits are appreciated. No gait instability. Skin:  Skin is warm, dry and intact. No rash noted. Psychiatric: Mood and affect are normal. Speech and behavior are normal.  ____________________________________________   LABS (all labs ordered are listed, but only abnormal results are displayed)  Labs Reviewed  CBC WITH DIFFERENTIAL/PLATELET - Abnormal; Notable for the following components:      Result Value   WBC 3.0 (*)    RBC 3.61 (*)    MCV 107.5 (*)    Platelets 133 (*)    Neutro  Abs 1.3 (*)    All other components within normal limits  COMPREHENSIVE METABOLIC PANEL - Abnormal; Notable for the following components:   Creatinine, Ser 1.59 (*)    GFR calc non Af Amer 31 (*)    GFR calc Af Amer 36 (*)    All other components within normal limits  TROPONIN I - Abnormal; Notable for the following components:   Troponin I 0.03 (*)    All other components within normal limits  TROPONIN I - Abnormal; Notable for the following components:   Troponin I 0.03 (*)    All other components within normal limits  URINALYSIS, COMPLETE (UACMP) WITH MICROSCOPIC   ____________________________________________  EKG  Read by heart station Dr. ____________________________________________  [place]  ED MD interpretation:    Official radiology report(s): Dg Chest 2 View  Result Date: 07/16/2018 CLINICAL DATA:  Multiple falls today. EXAM: CHEST - 2 VIEW COMPARISON:  Radiograph 11/16/2017 FINDINGS: Low lung volumes. Post median sternotomy. Unchanged cardiomegaly. Chronic vascular congestion. No acute airspace disease. No pleural effusion or pneumothorax. Patient's hand projects over the left lung apex on the AP view, patient had difficulty following instructions. Unchanged sclerotic density in the left proximal humerus. Exaggerated thoracic kyphosis. Bones under mineralized. No acute osseous abnormalities are seen. IMPRESSION: 1. Hypoventilatory chest without acute abnormality. 2. Unchanged chronic cardiomegaly and vascular congestion. Electronically Signed   By: Narda RutherfordMelanie  Sanford M.D.   On: 07/16/2018 01:48   Dg Elbow Complete Left  Result Date: 07/16/2018 CLINICAL DATA:  Multiple falls today. Left elbow laceration, injury. EXAM: LEFT ELBOW - COMPLETE 3+ VIEW COMPARISON:  None. FINDINGS: There is no evidence of fracture, dislocation, or joint effusion. Mild osteoarthritis. There is no evidence of arthropathy or other focal bone abnormality. Air in the soft tissues posteriorly consistent  with laceration. No radiopaque foreign body. IMPRESSION: Posterior elbow laceration. No radiopaque foreign body or acute osseous abnormality. Electronically Signed   By: Narda RutherfordMelanie  Sanford M.D.   On: 07/16/2018 01:46   Dg Knee 2 Views Left  Result Date: 07/16/2018 CLINICAL DATA:  Left knee pain after fall. EXAM: LEFT KNEE - 1-2 VIEW COMPARISON:  Radiographs of April 29, 2016. FINDINGS: No evidence of fracture, dislocation, or joint effusion. Mild narrowing of medial joint space is noted. Soft tissues are unremarkable. IMPRESSION: Mild degenerative joint disease is noted medially. No acute abnormality seen in the left knee. Electronically Signed   By: Lupita RaiderJames  Green Jr M.D.   On: 07/16/2018 09:10    ____________________________________________   PROCEDURES  Procedure(s) performed (including Critical Care):  Marland Kitchen.Marland Kitchen.Laceration Repair  Date/Time: 07/16/2018 8:46 AM Performed by: Joni ReiningSmith,  K, PA-C Authorized by: Joni ReiningSmith,  K, PA-C   Consent:    Consent obtained:  Verbal   Consent given by:  Patient   Risks discussed:  Infection, pain and retained foreign body Anesthesia (see MAR for exact dosages):    Anesthesia method:  Local infiltration Laceration details:    Location:  Shoulder/arm   Shoulder/arm location:  L elbow Repair type:    Repair type:  Simple Pre-procedure details:    Preparation:  Patient was prepped and draped in usual sterile fashion Treatment:    Area cleansed with:  Betadine and saline   Amount of cleaning:  Standard   Irrigation solution:  Sterile water   Irrigation method:  Syringe   Visualized foreign bodies/material removed: no   Skin repair:    Repair method:  Sutures   Suture size:  3-0   Suture material:  Nylon   Suture technique:  Simple interrupted   Number of sutures:  5 Approximation:    Approximation:  Close Post-procedure details:    Dressing:  Sterile dressing   Patient tolerance of procedure:  Tolerated well, no immediate complications  .Marland Kitchen.Laceration Repair  Date/Time: 07/16/2018 8:47 AM Performed by: Joni ReiningSmith,  K, PA-C Authorized by: Joni ReiningSmith,  K, PA-C   Consent:    Consent obtained:  Verbal   Consent given by:  Patient   Risks discussed:  Infection Anesthesia (see MAR for exact dosages):    Anesthesia method:  None Laceration details:    Location:  Hand   Hand location:  L palm   Length (cm):  1 Repair type:    Repair type:  Simple Pre-procedure details:    Preparation:  Patient was prepped and draped in usual  sterile fashion Exploration:    Contaminated: no   Treatment:    Area cleansed with:  Betadine and saline   Amount of cleaning:  Standard   Irrigation solution:  Sterile saline Skin repair:    Repair method:  Tissue adhesive Approximation:    Approximation:  Close Post-procedure details:    Dressing:  Sterile dressing     ____________________________________________   INITIAL IMPRESSION / ASSESSMENT AND PLAN / ED COURSE  As part of my medical decision making, I reviewed the following data within the electronic MEDICAL RECORD NUMBER         Patient presents with laceration to left elbow, pain to left elbow and left knee secondary to a fall.  History obtained by husband since patient has dementia.  Discussed with husband rationale for suturing elbow laceration and applied Dermabond to the skin abrasion/laceration left palm.      ____________________________________________   FINAL CLINICAL IMPRESSION(S) / ED DIAGNOSES  Final diagnoses:  Laceration of left elbow, initial encounter  Laceration of left thumb without foreign body without damage to nail, initial encounter  Contusion of left elbow, initial encounter  Contusion of left knee, initial encounter  Fall, initial encounter     ED Discharge Orders    None       [name]:  This document was prepared using Dragon voice recognition software and may include unintentional dictation errors.    Joni ReiningSmith,  K, PA-C 07/16/18  [id number]    Jene EveryKinner, Robert, MD 07/16/18 [phone]

## 2018-07-16 NOTE — ED Notes (Signed)
Pt's spouse updated on wait. Spouse verbalizes understanding. Pt continues to fidgit in wheelchair. resps unlabored.

## 2018-07-16 NOTE — ED Notes (Signed)
See triage note  Presents with family  Per family she has had several falls.  Laceration noted to left elbow

## 2018-07-16 NOTE — ED Notes (Signed)
Placed on stretcher with family assistance  Side rails up

## 2018-07-16 NOTE — ED Triage Notes (Signed)
Pt with multiple falls today per husband. Pt with history of dementia. Pt with left elbow laceration noted. Per husband no loc noted. Pt is not able to give history.

## 2018-07-16 NOTE — ED Notes (Signed)
Pt sitting in subwait with spouse.

## 2018-07-19 NOTE — ED Notes (Signed)
Pt slid to floor when provider trying to place pt up in stretcher to suture arm

## 2019-07-11 ENCOUNTER — Inpatient Hospital Stay
Admission: EM | Admit: 2019-07-11 | Discharge: 2019-07-15 | DRG: 070 | Disposition: A | Discharge status: Home Health | Payer: Medicare (Managed Care) | Source: Ambulatory Visit | Attending: Internal Medicine | Admitting: Internal Medicine

## 2019-07-11 ENCOUNTER — Emergency Department: Payer: Medicare (Managed Care)

## 2019-07-11 ENCOUNTER — Encounter: Payer: Self-pay | Admitting: Emergency Medicine

## 2019-07-11 ENCOUNTER — Other Ambulatory Visit: Payer: Self-pay

## 2019-07-11 DIAGNOSIS — F05 Delirium due to known physiological condition: Secondary | ICD-10-CM | POA: Diagnosis present

## 2019-07-11 DIAGNOSIS — Z8249 Family history of ischemic heart disease and other diseases of the circulatory system: Secondary | ICD-10-CM

## 2019-07-11 DIAGNOSIS — S060X9A Concussion with loss of consciousness of unspecified duration, initial encounter: Secondary | ICD-10-CM | POA: Diagnosis present

## 2019-07-11 DIAGNOSIS — F329 Major depressive disorder, single episode, unspecified: Secondary | ICD-10-CM | POA: Diagnosis present

## 2019-07-11 DIAGNOSIS — Y92019 Unspecified place in single-family (private) house as the place of occurrence of the external cause: Secondary | ICD-10-CM | POA: Diagnosis not present

## 2019-07-11 DIAGNOSIS — I252 Old myocardial infarction: Secondary | ICD-10-CM | POA: Diagnosis not present

## 2019-07-11 DIAGNOSIS — J441 Chronic obstructive pulmonary disease with (acute) exacerbation: Secondary | ICD-10-CM | POA: Diagnosis not present

## 2019-07-11 DIAGNOSIS — Z20822 Contact with and (suspected) exposure to covid-19: Secondary | ICD-10-CM | POA: Diagnosis present

## 2019-07-11 DIAGNOSIS — E860 Dehydration: Secondary | ICD-10-CM | POA: Diagnosis present

## 2019-07-11 DIAGNOSIS — Z951 Presence of aortocoronary bypass graft: Secondary | ICD-10-CM

## 2019-07-11 DIAGNOSIS — K219 Gastro-esophageal reflux disease without esophagitis: Secondary | ICD-10-CM | POA: Diagnosis present

## 2019-07-11 DIAGNOSIS — M25562 Pain in left knee: Secondary | ICD-10-CM | POA: Diagnosis present

## 2019-07-11 DIAGNOSIS — S0003XA Contusion of scalp, initial encounter: Secondary | ICD-10-CM | POA: Diagnosis present

## 2019-07-11 DIAGNOSIS — J449 Chronic obstructive pulmonary disease, unspecified: Secondary | ICD-10-CM | POA: Diagnosis present

## 2019-07-11 DIAGNOSIS — I110 Hypertensive heart disease with heart failure: Secondary | ICD-10-CM | POA: Diagnosis present

## 2019-07-11 DIAGNOSIS — G9341 Metabolic encephalopathy: Principal | ICD-10-CM | POA: Diagnosis present

## 2019-07-11 DIAGNOSIS — I2510 Atherosclerotic heart disease of native coronary artery without angina pectoris: Secondary | ICD-10-CM | POA: Diagnosis present

## 2019-07-11 DIAGNOSIS — I5033 Acute on chronic diastolic (congestive) heart failure: Secondary | ICD-10-CM | POA: Diagnosis present

## 2019-07-11 DIAGNOSIS — I509 Heart failure, unspecified: Secondary | ICD-10-CM

## 2019-07-11 DIAGNOSIS — W08XXXA Fall from other furniture, initial encounter: Secondary | ICD-10-CM | POA: Diagnosis present

## 2019-07-11 DIAGNOSIS — J9601 Acute respiratory failure with hypoxia: Secondary | ICD-10-CM | POA: Diagnosis present

## 2019-07-11 DIAGNOSIS — E785 Hyperlipidemia, unspecified: Secondary | ICD-10-CM | POA: Diagnosis present

## 2019-07-11 DIAGNOSIS — I10 Essential (primary) hypertension: Secondary | ICD-10-CM | POA: Diagnosis not present

## 2019-07-11 DIAGNOSIS — Z87891 Personal history of nicotine dependence: Secondary | ICD-10-CM | POA: Diagnosis not present

## 2019-07-11 DIAGNOSIS — N390 Urinary tract infection, site not specified: Secondary | ICD-10-CM | POA: Diagnosis not present

## 2019-07-11 DIAGNOSIS — Z79899 Other long term (current) drug therapy: Secondary | ICD-10-CM

## 2019-07-11 DIAGNOSIS — R4182 Altered mental status, unspecified: Secondary | ICD-10-CM

## 2019-07-11 DIAGNOSIS — Z66 Do not resuscitate: Secondary | ICD-10-CM | POA: Diagnosis present

## 2019-07-11 DIAGNOSIS — F0390 Unspecified dementia without behavioral disturbance: Secondary | ICD-10-CM | POA: Diagnosis present

## 2019-07-11 DIAGNOSIS — Z880 Allergy status to penicillin: Secondary | ICD-10-CM

## 2019-07-11 DIAGNOSIS — Z881 Allergy status to other antibiotic agents status: Secondary | ICD-10-CM | POA: Diagnosis not present

## 2019-07-11 DIAGNOSIS — Z7982 Long term (current) use of aspirin: Secondary | ICD-10-CM | POA: Diagnosis not present

## 2019-07-11 DIAGNOSIS — R402232 Coma scale, best verbal response, inappropriate words, at arrival to emergency department: Secondary | ICD-10-CM | POA: Diagnosis present

## 2019-07-11 DIAGNOSIS — R402142 Coma scale, eyes open, spontaneous, at arrival to emergency department: Secondary | ICD-10-CM | POA: Diagnosis present

## 2019-07-11 DIAGNOSIS — R402352 Coma scale, best motor response, localizes pain, at arrival to emergency department: Secondary | ICD-10-CM | POA: Diagnosis present

## 2019-07-11 HISTORY — DX: Unspecified dementia, unspecified severity, without behavioral disturbance, psychotic disturbance, mood disturbance, and anxiety: F03.90

## 2019-07-11 LAB — CBC WITH DIFFERENTIAL/PLATELET
Abs Immature Granulocytes: 0.01 10*3/uL (ref 0.00–0.07)
Basophils Absolute: 0 10*3/uL (ref 0.0–0.1)
Basophils Relative: 1 %
Eosinophils Absolute: 0.1 10*3/uL (ref 0.0–0.5)
Eosinophils Relative: 2 %
HCT: 37.5 % (ref 36.0–46.0)
Hemoglobin: 11.9 g/dL — ABNORMAL LOW (ref 12.0–15.0)
Immature Granulocytes: 0 %
Lymphocytes Relative: 23 %
Lymphs Abs: 0.9 10*3/uL (ref 0.7–4.0)
MCH: 33.5 pg (ref 26.0–34.0)
MCHC: 31.7 g/dL (ref 30.0–36.0)
MCV: 105.6 fL — ABNORMAL HIGH (ref 80.0–100.0)
Monocytes Absolute: 0.6 10*3/uL (ref 0.1–1.0)
Monocytes Relative: 14 %
Neutro Abs: 2.4 10*3/uL (ref 1.7–7.7)
Neutrophils Relative %: 60 %
Platelets: 156 10*3/uL (ref 150–400)
RBC: 3.55 MIL/uL — ABNORMAL LOW (ref 3.87–5.11)
RDW: 13.1 % (ref 11.5–15.5)
WBC: 4 10*3/uL (ref 4.0–10.5)
nRBC: 0 % (ref 0.0–0.2)

## 2019-07-11 LAB — URINALYSIS, COMPLETE (UACMP) WITH MICROSCOPIC
Bilirubin Urine: NEGATIVE
Glucose, UA: NEGATIVE mg/dL
Hgb urine dipstick: NEGATIVE
Ketones, ur: NEGATIVE mg/dL
Nitrite: NEGATIVE
Protein, ur: NEGATIVE mg/dL
Specific Gravity, Urine: 1.009 (ref 1.005–1.030)
pH: 5 (ref 5.0–8.0)

## 2019-07-11 LAB — BRAIN NATRIURETIC PEPTIDE: B Natriuretic Peptide: 450.4 pg/mL — ABNORMAL HIGH (ref 0.0–100.0)

## 2019-07-11 LAB — BASIC METABOLIC PANEL
Anion gap: 11 (ref 5–15)
BUN: 30 mg/dL — ABNORMAL HIGH (ref 8–23)
CO2: 31 mmol/L (ref 22–32)
Calcium: 9 mg/dL (ref 8.9–10.3)
Chloride: 99 mmol/L (ref 98–111)
Creatinine, Ser: 1.53 mg/dL — ABNORMAL HIGH (ref 0.44–1.00)
GFR calc Af Amer: 37 mL/min — ABNORMAL LOW (ref >60)
GFR calc non Af Amer: 32 mL/min — ABNORMAL LOW (ref >60)
Glucose, Bld: 95 mg/dL (ref 70–99)
Potassium: 4.2 mmol/L (ref 3.5–5.1)
Sodium: 141 mmol/L (ref 135–145)

## 2019-07-11 LAB — TROPONIN I (HIGH SENSITIVITY)
Troponin I (High Sensitivity): 32 ng/L — ABNORMAL HIGH (ref <18)
Troponin I (High Sensitivity): 33 ng/L — ABNORMAL HIGH (ref <18)

## 2019-07-11 LAB — SARS CORONAVIRUS 2 BY RT PCR (HOSPITAL ORDER, PERFORMED IN ~~LOC~~ HOSPITAL LAB): SARS Coronavirus 2: NEGATIVE

## 2019-07-11 MED ORDER — SODIUM CHLORIDE 0.9 % IV SOLN
1.0000 g | Freq: Once | INTRAVENOUS | Status: AC
  Administered 2019-07-11: 1 g via INTRAVENOUS
  Filled 2019-07-11: qty 10

## 2019-07-11 MED ORDER — LORAZEPAM 2 MG/ML IJ SOLN: 2.0000 mg | Freq: Once | INTRAMUSCULAR | Status: AC

## 2019-07-11 MED ORDER — DIPHENHYDRAMINE HCL 50 MG/ML IJ SOLN
50.0000 mg | Freq: Once | INTRAMUSCULAR | Status: AC
  Administered 2019-07-11: 50 mg via INTRAVENOUS
  Filled 2019-07-11: qty 1

## 2019-07-11 MED ORDER — HALOPERIDOL LACTATE 5 MG/ML IJ SOLN
5.0000 mg | Freq: Once | INTRAMUSCULAR | Status: AC
  Administered 2019-07-11: 5 mg via INTRAVENOUS
  Filled 2019-07-11: qty 1

## 2019-07-11 MED ORDER — LORAZEPAM 2 MG/ML IJ SOLN
INTRAMUSCULAR | Status: AC
  Administered 2019-07-11: 2 mg via INTRAVENOUS
  Filled 2019-07-11: qty 1

## 2019-07-11 MED ORDER — FUROSEMIDE 10 MG/ML IJ SOLN
60.0000 mg | Freq: Once | INTRAMUSCULAR | Status: AC
  Administered 2019-07-11: 60 mg via INTRAVENOUS
  Filled 2019-07-11: qty 8

## 2019-07-11 NOTE — ED Triage Notes (Addendum)
Patient brought to ED by [facility] from [facility] office. Patient lives at home with her husband and sleeps on the couch. Per husband, he heard patient fall this morning around 330. Patient has bruising to face, new since fall. Patient altered form baseline per husband. Hx of dementia but usually interactive and able to walk with walker. Patient was also found to be hypoxic at clinic, 88% on room air. Placed on 3L [place]. Increase to 97%. Given albuterol treatment pta.  Patient also complaining of pain with movement. Bruise noted to left knee. Knee was xrayed by clinic, xray was negative.   [facility] is set up with [facility], who states to call them for any needs or changes in care.

## 2019-07-11 NOTE — ED Notes (Addendum)
ED Provider Derrill Kay at bedside to provide status update to spouse.

## 2019-07-11 NOTE — ED Notes (Signed)
Pt to CT

## 2019-07-11 NOTE — ED Notes (Signed)
Pt attempting to take off PIV and monitor. Pt punching and kicking at this RN.Leota Sauers made aware.

## 2019-07-11 NOTE — ED Notes (Signed)
Patient hollering out. Swinging at and pinching staff. Patient continues to try to get out of bed. MD and charge RN aware.

## 2019-07-11 NOTE — ED Notes (Signed)
Edp goodman at bedside.

## 2019-07-11 NOTE — H&P (Signed)
History and Physical   Cynthia [name] DGL:[id number] DOB: 1940-10-02 DOA: 07/11/2019  Referring MD/NP/PA: Dr. Archie Balboa  PCP: System, Pcp Not In   Outpatient Specialists: None  Patient coming from: Home through pace program  Chief Complaint: Altered mental status  HPI: Cynthia [name] is a 79 y.o. female with medical history significant of dementia, coronary artery disease, with history of CABG, hyperlipidemia, hypertension, who was at the [facility] program today and sustained a fall with altered mental status.  She was found to be dehydrated and weak.  She was sent over to the ER for evaluation.  Patient unable to give any history.  Patient's husband said symptoms started about 3 AM last night where he had her fall off the couch.  Since then she has been having left knee pain.  She went to the clinic today where they noted the confusion.  She is demented to begin with but this is worse.  Sent over to the ER where she remains confused in the morning.  No fever no chills no nausea vomiting or diarrhea.  Patient noted to have possible UTI also.  She is being admitted therefore for further evaluation and treatment..  ED Course: Temperature 98.6 blood pressure 153/87 pulse 59 respiratory 20 oxygen sat 97% room air.  BUN 30 creatinine 1.53.  BNP 450.  Troponin 32 and 33.  CBC showed hemoglobin 11.9 otherwise stable.  Urinalysis showed WBC 6-10 rare bacteria.  COVID-19 is negative.  Chest x-ray shows central vascular congestion with no acute airspace disease.  Head CT without contrast shows frontal midline scalp swelling with current septal hematoma measuring up to 6 mm in maximal thickness.  No soft Jassen clavicular fracture.  Background chronic microvascular angiopathy.  No evidence of acute fracture or traumatic disease of cervical spine.  1.7 cm nodule in the posterior right lobe thyroid gland.  Review of Systems: As per HPI otherwise 10 point review of systems negative.    Past Medical History:    Diagnosis Date  . Blood clot in vein   . Coronary artery disease   . D[place])   . GERD (gastroesophageal reflux disease)   . Heart attack ([place])   . Hx of CABG 2013  . Hyperlipidemia   . Hypertension     Past Surgical History:  Procedure Laterality Date  . CARDIAC SURGERY    . HIP SURGERY Left 12/2014  . SHOULDER SURGERY Bilateral      reports that she has been smoking cigarettes. She has a 3.50 pack-year smoking history. She has never used smokeless tobacco. She reports that she does not drink alcohol and does not use drugs.  Allergies  Allergen Reactions  . Penicillins Swelling and Other (See Comments)    Has patient had a PCN reaction causing immediate rash, facial/tongue/throat swelling, SOB or lightheadedness with hypotension: No Has patient had a PCN reaction causing severe rash involving mucus membranes or skin necrosis: No Has patient had a PCN reaction that required hospitalization: No Has patient had a PCN reaction occurring within the last 10 years: No If all of the above answers are "NO", then may proceed with Cephalosporin use.      Family History  Problem Relation Age of Onset  . Hypertension Other   . Alcohol abuse Other      Prior to Admission medications   Medication Sig Start Date End Date Taking? Authorizing Provider  aspirin EC 81 MG tablet Take 81 mg by mouth daily.    [provider]  benzonatate (TESSALON) 100 MG capsule Take 100 mg by mouth 3 (three) times daily as needed for cough.    [provider]  furosemide (LASIX) 80 MG tablet Take 80 mg by mouth daily.    [provider]  HYDROcodone-acetaminophen (NORCO/VICODIN) 5-325 MG tablet Take 1 tablet by mouth 2 (two) times daily as needed for moderate pain.    [provider]  ipratropium-albuterol (DUONEB) 0.5-2.5 (3) MG/3ML SOLN Take 3 mLs by nebulization every 6 (six) hours as needed (for shortness of breath/wheezing).     [provider]   isosorbide mononitrate (IMDUR) 30 MG 24 hr tablet Take 30 mg by mouth daily.    [provider]  latanoprost (XALATAN) 0.005 % ophthalmic solution Place 1 drop into both eyes at bedtime.    [provider]  LORazepam (ATIVAN) 2 MG/ML concentrated solution Take 0.5 mg by mouth every 2 (two) hours as needed for anxiety (agitation).    [provider]  Morphine Sulfate (MORPHINE CONCENTRATE) 10 mg / 0.5 ml concentrated solution Place 4 mg under the tongue every 2 (two) hours as needed for severe pain.    [provider]  nitroGLYCERIN (NITROSTAT) 0.4 MG SL tablet Place 0.4 mg under the tongue every 5 (five) minutes as needed for chest pain.    [provider]  potassium chloride (K-DUR,KLOR-CON) 10 MEQ tablet Take 10 mEq by mouth daily.    [provider]  salmeterol (SEREVENT) 50 MCG/DOSE diskus inhaler Inhale 1 puff into the lungs 2 (two) times daily.    [provider]  sennosides-docusate sodium (SENOKOT-S) 8.6-50 MG tablet Take 1 tablet by mouth daily.    [provider]    Physical Exam: Vitals:   07/11/19 1541 07/11/19 1738 07/11/19 1900 07/11/19 2242  BP: (!) 153/87 129/68 129/64 130/75  Pulse: (!) 56 (!) 56 (!) 57 (!) 59  Resp:  13 15 16   Temp: 98.2 F (36.8 C)     TempSrc: Oral     SpO2: 99% 100% 100% 100%  Weight: 91 kg     Height: 6' (1.829 m)         Constitutional: Confused, unresponsive, Vitals:   07/11/19 1541 07/11/19 1738 07/11/19 1900 07/11/19 2242  BP: (!) 153/87 129/68 129/64 130/75  Pulse: (!) 56 (!) 56 (!) 57 (!) 59  Resp:  13 15 16   Temp: 98.2 F (36.8 C)     TempSrc: Oral     SpO2: 99% 100% 100% 100%  Weight: 91 kg     Height: 6' (1.829 m)      Eyes: PERRL, lids and conjunctivae injected ENMT: Mucous membranes are dry. Posterior pharynx clear of any exudate or lesions.Normal dentition.  Neck: normal, supple, no masses, no thyromegaly Respiratory: clear to auscultation bilaterally,  no wheezing, no crackles. Normal respiratory effort. No accessory muscle use.  Cardiovascular: Bradycardia, no murmurs / rubs / gallops. No extremity edema. 2+ pedal pulses. No carotid bruits.  Abdomen: no tenderness, no masses palpated. No hepatosplenomegaly. Bowel sounds positive.  Musculoskeletal: no clubbing / cyanosis. No joint deformity upper and lower extremities. Good ROM, no contractures. Normal muscle tone.  Skin: no rashes, lesions, ulcers. No induration Neurologic: CN 2-12 grossly intact. Sensation intact, DTR normal. Strength 5/5 in all 4.  Psychiatric: Confused, unresponsive, not agitated   Labs on Admission: I have personally reviewed following labs and imaging studies  CBC: Recent Labs  Lab 07/11/19 1555  WBC 4.0  NEUTROABS 2.4  HGB 11.9*  HCT 37.5  MCV 105.6*  PLT 156   Basic Metabolic Panel: Recent Labs  Lab 07/11/19 1555  NA 141  K 4.2  CL 99  CO2 31  GLUCOSE 95  BUN 30*  CREATININE 1.53*  CALCIUM 9.0   GFR: Estimated Creatinine Clearance: 38.4 mL/min (A) (by C-G formula based on SCr of 1.53 mg/dL (H)). Liver Function Tests: No results for input(s): AST, ALT, ALKPHOS, BILITOT, PROT, ALBUMIN in the last 168 hours. No results for input(s): LIPASE, AMYLASE in the last 168 hours. No results for input(s): AMMONIA in the last 168 hours. Coagulation Profile: No results for input(s): INR, PROTIME in the last 168 hours. Cardiac Enzymes: No results for input(s): CKTOTAL, CKMB, CKMBINDEX, TROPONINI in the last 168 hours. BNP (last 3 results) No results for input(s): PROBNP in the last 8760 hours. HbA1C: No results for input(s): HGBA1C in the last 72 hours. CBG: No results for input(s): GLUCAP in the last 168 hours. Lipid Profile: No results for input(s): CHOL, HDL, LDLCALC, TRIG, CHOLHDL, LDLDIRECT in the last 72 hours. Thyroid Function Tests: No results for input(s): TSH, T4TOTAL, FREET4, T3FREE, THYROIDAB in the last 72 hours. Anemia Panel: No results  for input(s): VITAMINB12, FOLATE, FERRITIN, TIBC, IRON, RETICCTPCT in the last 72 hours. Urine analysis:    Component Value Date/Time   COLORURINE YELLOW (A) 07/11/2019 1555   APPEARANCEUR CLEAR (A) 07/11/2019 1555   APPEARANCEUR Hazy 06/08/2013 0721   LABSPEC 1.009 07/11/2019 1555   LABSPEC 1.024 06/08/2013 0721   PHURINE 5.0 07/11/2019 1555   GLUCOSEU NEGATIVE 07/11/2019 1555   GLUCOSEU Negative 06/08/2013 0721   HGBUR NEGATIVE 07/11/2019 1555   BILIRUBINUR NEGATIVE 07/11/2019 1555   BILIRUBINUR Negative 06/08/2013 0721   KETONESUR NEGATIVE 07/11/2019 1555   PROTEINUR NEGATIVE 07/11/2019 1555   NITRITE NEGATIVE 07/11/2019 1555   LEUKOCYTESUR TRACE (A) 07/11/2019 1555   LEUKOCYTESUR 3+ 06/08/2013 0721   Sepsis Labs: @LABRCNTIP (procalcitonin:4,lacticidven:4) ) Recent Results (from the past 240 hour(s))  SARS Coronavirus 2 by RT PCR (hospital order, [facility]) Nasopharyngeal Nasopharyngeal Swab     Status: None   Collection Time: 07/11/19 10:13 PM   Specimen: Nasopharyngeal Swab  Result Value Ref Range Status   SARS Coronavirus 2 NEGATIVE NEGATIVE Final    Comment: (NOTE) SARS-CoV-2 target nucleic acids are NOT DETECTED.  The SARS-CoV-2 RNA is generally detectable in upper and lower respiratory specimens during the acute phase of infection. The lowest concentration of SARS-CoV-2 viral copies this assay can detect is 250 copies / mL. A negative result does not preclude SARS-CoV-2 infection and should not be used as the sole basis for treatment or other patient management decisions.  A negative result may occur with improper specimen collection / handling, submission of specimen other than nasopharyngeal swab, presence of viral mutation(s) within the areas targeted by this assay, and inadequate number of viral copies (<250 copies / mL). A negative result must be combined with clinical observations, patient history, and epidemiological  information.  Fact Sheet for Patients:   BoilerBrush.com.cyhttps://www.fda.gov/media/136312/download  Fact Sheet for Healthcare Providers: https://pope.com/https://www.fda.gov/media/136313/download  This test is not yet approved or  cleared by the [place] FDA and has been authorized for detection and/or diagnosis of SARS-CoV-2 by FDA under an Emergency Use Authorization (EUA).  This EUA will remain in effect (meaning this test can be used) for the duration of the COVID-19 declaration under Section 564(b)(1) of the Act, 21 U.S.C. section 360bbb-3(b)(1), unless the authorization is terminated or revoked sooner.  Performed at [facility], [address] [place], [place] [address]      Radiological Exams on Admission: CT Head Wo Contrast  Result Date: 07/11/2019 CLINICAL DATA:  Fall EXAM: CT HEAD WITHOUT CONTRAST CT CERVICAL SPINE WITHOUT CONTRAST TECHNIQUE: Multidetector CT imaging of the head and cervical spine was performed following the standard protocol without intravenous contrast. Multiplanar CT image reconstructions of the cervical spine were also generated. COMPARISON:  CT head 11/14/2017, CT cervical spine 05/16/2013 FINDINGS: CT HEAD FINDINGS Brain: No evidence of acute infarction, hemorrhage, hydrocephalus, extra-axial collection or mass lesion/mass effect. Symmetric prominence of the ventricles, cisterns and sulci compatible with parenchymal volume loss. Patchy areas of white matter hypoattenuation are most compatible with chronic microvascular angiopathy. Benign dural calcifications are similar to priors. Vascular: Atherosclerotic calcification of the carotid siphons. No hyperdense vessel. Skull: Frontal midline scalp swelling with crescentic hematoma measuring up to 6 mm in maximal thickness. No subjacent calvarial fracture or suspicious osseous lesions. No visible facial bone fracture within the included levels of imaging. Sinuses/Orbits: Paranasal sinuses are predominantly clear. Right mastoid  air cells are clear. Trace left mastoid effusion. No visible or suspected temporal bone fracture. Orbital structures are unremarkable aside from prior lens extractions. Other: Edentulous with mild mandibular prognathism. CT CERVICAL SPINE FINDINGS Alignment: Stabilization collar absent at time of examination. There is mild straightening of the normal cervical lordosis. There is fairly pronounced rightward cranial rotation. No evidence of traumatic listhesis. No abnormally widened, perched or jumped facets. Normal alignment of the craniocervical and atlantoaxial articulations accounting for the degree of rotation. Skull base and vertebrae: Moderate arthrosis at the atlantodental interval hypertrophic spurring. Multilevel spondylitic changes are present in the cervical vertebral bodies as well as some sclerotic and cystic facet degenerative changes as well. No visible skull base fracture. No vertebral body fracture or height loss is seen. No worrisome osseous lesions. Soft tissues and spinal canal: No pre or paravertebral fluid or swelling. No visible canal hematoma.There are extensive postsurgical changes in the left neck. Disc levels: Multilevel diffuse intervertebral disc height loss with corresponding spondylitic and facet degenerative changes in the spine. Posterior disc osteophyte complexes present at C4-5 and C5-6 result in at most mild canal narrowing. Some uncinate spurring and facet hypertrophic changes result in some mild multilevel neural foraminal narrowing with more moderate narrowing on the left at C3-4. Upper chest: Right apical lung hernia extending through the thoracic inlet. Extensive respiratory motion artifact limits the evaluation of the lung parenchyma. Other: 1.7 cm hypoattenuating nodule in the posterior right lobe thyroid gland. IMPRESSION: 1. Frontal midline scalp swelling with crescentic hematoma measuring up to 6 mm in maximal thickness. No subjacent calvarial fracture or acute  intracranial abnormality. 2. Background of chronic microvascular angiopathy and parenchymal volume loss. 3. No evidence of acute fracture or traumatic listhesis of the cervical spine. 4. Multilevel spondylitic and facet degenerative changes of the cervical spine as described above. 5. Right apical lung hernia extending through the thoracic inlet. 6. 1.7 cm hypoattenuating nodule in the posterior right lobe thyroid gland. Consider further evaluation with nonemergent thyroid ultrasound. This follows consensus guidelines: Managing Incidental Thyroid Nodules Detected on Imaging: White Paper of the ACR Incidental Thyroid Findings Committee. J Am Coll Radiol 2015; 12:143-150. and [facility] 3-tiered system for managing ITNs: J Am Coll Radiol. 2015; Feb;12(2): 143-50 Electronically Signed   By: Kreg Shropshire M.D.   On: 07/11/2019 16:33   CT Cervical Spine Wo Contrast  Result Date: 07/11/2019 CLINICAL DATA:  Fall  EXAM: CT HEAD WITHOUT CONTRAST CT CERVICAL SPINE WITHOUT CONTRAST TECHNIQUE: Multidetector CT imaging of the head and cervical spine was performed following the standard protocol without intravenous contrast. Multiplanar CT image reconstructions of the cervical spine were also generated. COMPARISON:  CT head 11/14/2017, CT cervical spine 05/16/2013 FINDINGS: CT HEAD FINDINGS Brain: No evidence of acute infarction, hemorrhage, hydrocephalus, extra-axial collection or mass lesion/mass effect. Symmetric prominence of the ventricles, cisterns and sulci compatible with parenchymal volume loss. Patchy areas of white matter hypoattenuation are most compatible with chronic microvascular angiopathy. Benign dural calcifications are similar to priors. Vascular: Atherosclerotic calcification of the carotid siphons. No hyperdense vessel. Skull: Frontal midline scalp swelling with crescentic hematoma measuring up to 6 mm in maximal thickness. No subjacent calvarial fracture or suspicious osseous lesions. No visible facial bone  fracture within the included levels of imaging. Sinuses/Orbits: Paranasal sinuses are predominantly clear. Right mastoid air cells are clear. Trace left mastoid effusion. No visible or suspected temporal bone fracture. Orbital structures are unremarkable aside from prior lens extractions. Other: Edentulous with mild mandibular prognathism. CT CERVICAL SPINE FINDINGS Alignment: Stabilization collar absent at time of examination. There is mild straightening of the normal cervical lordosis. There is fairly pronounced rightward cranial rotation. No evidence of traumatic listhesis. No abnormally widened, perched or jumped facets. Normal alignment of the craniocervical and atlantoaxial articulations accounting for the degree of rotation. Skull base and vertebrae: Moderate arthrosis at the atlantodental interval hypertrophic spurring. Multilevel spondylitic changes are present in the cervical vertebral bodies as well as some sclerotic and cystic facet degenerative changes as well. No visible skull base fracture. No vertebral body fracture or height loss is seen. No worrisome osseous lesions. Soft tissues and spinal canal: No pre or paravertebral fluid or swelling. No visible canal hematoma.There are extensive postsurgical changes in the left neck. Disc levels: Multilevel diffuse intervertebral disc height loss with corresponding spondylitic and facet degenerative changes in the spine. Posterior disc osteophyte complexes present at C4-5 and C5-6 result in at most mild canal narrowing. Some uncinate spurring and facet hypertrophic changes result in some mild multilevel neural foraminal narrowing with more moderate narrowing on the left at C3-4. Upper chest: Right apical lung hernia extending through the thoracic inlet. Extensive respiratory motion artifact limits the evaluation of the lung parenchyma. Other: 1.7 cm hypoattenuating nodule in the posterior right lobe thyroid gland. IMPRESSION: 1. Frontal midline scalp swelling  with crescentic hematoma measuring up to 6 mm in maximal thickness. No subjacent calvarial fracture or acute intracranial abnormality. 2. Background of chronic microvascular angiopathy and parenchymal volume loss. 3. No evidence of acute fracture or traumatic listhesis of the cervical spine. 4. Multilevel spondylitic and facet degenerative changes of the cervical spine as described above. 5. Right apical lung hernia extending through the thoracic inlet. 6. 1.7 cm hypoattenuating nodule in the posterior right lobe thyroid gland. Consider further evaluation with nonemergent thyroid ultrasound. This follows consensus guidelines: Managing Incidental Thyroid Nodules Detected on Imaging: White Paper of the ACR Incidental Thyroid Findings Committee. J Am Coll Radiol 2015; 12:143-150. and [facility] 3-tiered system for managing ITNs: J Am Coll Radiol. 2015; Feb;12(2): 143-50 Electronically Signed   By: Kreg Shropshire M.D.   On: 07/11/2019 16:33   DG Chest Portable 1 View  Result Date: 07/11/2019 CLINICAL DATA:  Hypoxia, fell, dementia EXAM: PORTABLE CHEST 1 VIEW COMPARISON:  07/16/2018 FINDINGS: Single frontal view of the chest demonstrates an enlarged cardiac silhouette. Stable postsurgical changes from median sternotomy. There is mild central vascular  congestion without airspace disease, effusion, or pneumothorax. No acute displaced fracture. IMPRESSION: 1. Central vascular congestion.  No acute airspace disease. Electronically Signed   By: Sharlet Salina M.D.   On: 07/11/2019 16:14    EKG: Independently reviewed.  It shows sinus rhythm with right bundle branch block.  Assessment/Plan Principal Problem:   AMS (altered mental status) Active Problems:   Dehydration   HTN (hypertension)   HLD (hyperlipidemia)   GERD (gastroesophageal reflux disease)   CAD (coronary artery disease)   COPD exacerbation (HCC)   CHF (congestive heart failure) (HCC)   Acute lower UTI     #1 altered mental status: Probably  multifactorial.  Most likely secondary to acute medical problems.  Continue underlying treatment.  #2 dehydration: Aggressively hydrate.  Continue treatment  #3 coronary artery disease: Continue monitoring.  Appears compensated  #4 fall: Most likely secondary to acute medical problems.  No evidence of intracranial damage.  Continue treatment  #5 acute UTI: Continue empiric antibiotics.  #6 COPD: Continue treatment.  #7 diastolic CHF: Continue monitoring.  Hold diuretics.  #8 hyperlipidemia: Continue with statin  #9 GERD: Continue PPI  #10 hypertension: Blood pressure controlled.  Continue treatment   DVT prophylaxis: Lovenox Code Status: DNR Family Communication: Husband Disposition Plan: To be determined Consults called: None Admission status: Inpatient  Severity of Illness: The appropriate patient status for this patient is INPATIENT. Inpatient status is judged to be reasonable and necessary in order to provide the required intensity of service to ensure the patient's safety. The patient's presenting symptoms, physical exam findings, and initial radiographic and laboratory data in the context of their chronic comorbidities is felt to place them at high risk for further clinical deterioration. Furthermore, it is not anticipated that the patient will be medically stable for discharge from the hospital within 2 midnights of admission. The following factors support the patient status of inpatient.   " The patient's presenting symptoms include confusion. " The worrisome physical exam findings include confused and obtunded. " The initial radiographic and laboratory data are worrisome because of dehydration. " The chronic co-morbidities include dementia.   * I certify that at the point of admission it is my clinical judgment that the patient will require inpatient hospital care spanning beyond 2 midnights from the point of admission due to high intensity of service, high risk for  further deterioration and high frequency of surveillance required.Lonia Blood MD [facility] Pager [phone]  If 7PM-7AM, please contact night-coverage [email or web address] Password [email or web address]  07/12/2019, 12:20 AM

## 2019-07-11 NOTE — ED Notes (Signed)
Lab contacted to add urine culture.  

## 2019-07-11 NOTE — ED Provider Notes (Signed)
[facility] Emergency Department Provider Note   ____________________________________________   I have reviewed the triage vital signs and the nursing notes.   HISTORY  Chief Complaint Fall and Altered Mental Status   History limited by and level 5 caveat due to: Dementia and Altered Mental Status   HPI Cynthia [name] is a 79 y.o. female who presents to the emergency department today after [facility] because of concerns for fall and altered mental status.  The patient herself cannot give any history.  Apparently however around 3 AM last night her husband heard her fall off of the couch.  She had been complaining of some left knee pain.  At clinic she was noted to be more altered than her normal baseline dementia.  Additionally they obtained a chest x-ray which was concerning for some pulmonary edema.  They found the patient to be hypoxic.  They did obtain a knee x-ray which did not show any obvious fractures.  Records reviewed. Per medical record review patient has a history of dementia, COPD, HTN, HLD, CAD.   Past Medical History:  Diagnosis Date  . Blood clot in vein   . Coronary artery disease   . Dementia (HCC)   . GERD (gastroesophageal reflux disease)   . Heart attack (HCC)   . Hx of CABG 2013  . Hyperlipidemia   . Hypertension     Patient Active Problem List   Diagnosis Date Noted  . Sepsis (HCC) 11/14/2017  . CHF (congestive heart failure) (HCC) 08/09/2017  . COPD exacerbation (HCC) 03/26/2017  . Pelvic fracture (HCC) 04/30/2016  . HTN (hypertension) 04/30/2016  . HLD (hyperlipidemia) 04/30/2016  . GERD (gastroesophageal reflux disease) 04/30/2016  . CAD (coronary artery disease) 04/30/2016  . Chest pain 09/08/2014  . Dehydration 09/08/2014    Past Surgical History:  Procedure Laterality Date  . CARDIAC SURGERY    . HIP SURGERY Left 12/2014  . SHOULDER SURGERY Bilateral     Prior to Admission medications    Medication Sig Start Date End Date Taking? Authorizing Provider  aspirin EC 81 MG tablet Take 81 mg by mouth daily.    [provider]  benzonatate (TESSALON) 100 MG capsule Take 100 mg by mouth 3 (three) times daily as needed for cough.    [provider]  furosemide (LASIX) 80 MG tablet Take 80 mg by mouth daily.    [provider]  HYDROcodone-acetaminophen (NORCO/VICODIN) 5-325 MG tablet Take 1 tablet by mouth 2 (two) times daily as needed for moderate pain.    [provider]  ipratropium-albuterol (DUONEB) 0.5-2.5 (3) MG/3ML SOLN Take 3 mLs by nebulization every 6 (six) hours as needed (for shortness of breath/wheezing).     [provider]  isosorbide mononitrate (IMDUR) 30 MG 24 hr tablet Take 30 mg by mouth daily.    [provider]  latanoprost (XALATAN) 0.005 % ophthalmic solution Place 1 drop into both eyes at bedtime.    [provider]  LORazepam (ATIVAN) 2 MG/ML concentrated solution Take 0.5 mg by mouth every 2 (two) hours as needed for anxiety (agitation).    [provider]  Morphine Sulfate (MORPHINE CONCENTRATE) 10 mg / 0.5 ml concentrated solution Place 4 mg under the tongue every 2 (two) hours as needed for severe pain.    [provider]  nitroGLYCERIN (NITROSTAT) 0.4 MG SL tablet Place 0.4 mg under the tongue every 5 (five) minutes as needed for chest pain.    [provider]  potassium chloride (K-DUR,KLOR-CON) 10 MEQ tablet Take 10 mEq by mouth daily.    [provider]  salmeterol (SEREVENT) 50 MCG/DOSE diskus inhaler Inhale 1 puff into the lungs 2 (two) times daily.    [provider]  sennosides-docusate sodium (SENOKOT-S) 8.6-50 MG tablet Take 1 tablet by mouth daily.    [provider]    Allergies Penicillins  Family History  Problem Relation Age of Onset  . Hypertension Other   . Alcohol abuse Other     Social History Social History    Tobacco Use  . Smoking status: Current Every Day Smoker    Packs/day: 0.10    Years: 35.00    Pack years: 3.50    Types: Cigarettes  . Smokeless tobacco: Never Used  Substance Use Topics  . Alcohol use: No  . Drug use: No    Review of Systems Unable to obtain any reliable ROS secondary to mental status. ____________________________________________   PHYSICAL EXAM:  VITAL SIGNS: ED Triage Vitals  Enc Vitals Group     BP 07/11/19 1540 (!) 133/100     Pulse Rate 07/11/19 1540 (!) 57     Resp 07/11/19 1540 20     Temp 07/11/19 1540 98.6 F (37 C)     Temp Source 07/11/19 1540 Oral     SpO2 07/11/19 1540 97 %     Weight 07/11/19 1541 200 lb 9.9 oz (91 kg)     Height 07/11/19 1541 6' (1.829 m)   Constitutional: Awake and alert.  Eyes: Conjunctivae are normal.  ENT      Head: Normocephalic and atraumatic.      Nose: No congestion/rhinnorhea.      Mouth/Throat: Mucous membranes are moist.      Neck: No stridor. Hematological/Lymphatic/Immunilogical: No cervical lymphadenopathy. Cardiovascular: Normal rate, regular rhythm.  No murmurs, rubs, or gallops.  Respiratory: Normal respiratory effort without tachypnea nor retractions. Breath sounds are clear and equal bilaterally. No wheezes/rales/rhonchi. Gastrointestinal: Soft and non tender. No rebound. No guarding.  Genitourinary: Deferred Musculoskeletal: Normal range of motion in all extremities. Some tender to palpation of bilateral knees. No deformity or swelling appreciated.  Neurologic:  Awake, alert. Not completely oriented. Moving all extremities.  Skin:  Skin is warm, dry and intact. No rash noted. Psychiatric: Agitated.  ____________________________________________    LABS (pertinent positives/negatives)  BNP 450.4 BMP na 141, k 4.2, glu 95, cr 1.53 UA clear, trace leukocytes, 6-10 wbc, rare bacteria CBC wbc 4.0, hgb 11.9, plt 156  ____________________________________________   EKG  I, Phineas Semen,  attending physician, personally viewed and interpreted this EKG  EKG Time: 1649 Rate: 62 Rhythm: sinus rhythm Axis: normal Intervals: qtc 505 QRS: RBBB, LPFB ST changes: no st elevation Impression: abnormal ekg   ____________________________________________    RADIOLOGY  CT head/cervical spine No acute intracranial traumatic injury. No acute osseous injury  CXR Central vascular congestion ____________________________________________   PROCEDURES  Procedures  ____________________________________________   INITIAL IMPRESSION / ASSESSMENT AND PLAN / ED COURSE  Pertinent labs & imaging results that were available during my care of the patient were reviewed by me and considered in my medical decision making (see chart for details).   Patient presented to the emergency department today because of concerns for fall, agitation and shortness of breath with hypoxia.  [facility].  Patient was extremely agitation on initial exam.  Given level of agitation which was interfering with exam and treatment patient was given medication  to help calm her.  Work-up here was concerning for possible fluid overload.  Patient was given Lasix.  Head CT was obtained given evidence of head trauma and this was negative for any acute intracranial or osseous injury.  Patient's urine is concerning for possible urinary tract infection.  Will plan on starting IV antibiotics.  Will plan on admission to the hospital service.  ___________________________________________   FINAL CLINICAL IMPRESSION(S) / ED DIAGNOSES  Final diagnoses:  Lower urinary tract infectious disease  Congestive heart failure, unspecified HF chronicity, unspecified heart failure type [facility])     Note: This dictation was prepared with Dragon dictation. Any transcriptional errors that result from this process are unintentional     Nance Pear, MD 07/11/19 2231

## 2019-07-11 NOTE — ED Notes (Signed)
Pt spouse no longer at bedside at this time. St "I have to go home".  Pt resting at this time comfortably. VSS. NAD.

## 2019-07-11 NOTE — ED Notes (Signed)
Pt combative at this time.

## 2019-07-11 NOTE — ED Notes (Signed)
Spouse at bedside

## 2019-07-12 DIAGNOSIS — N390 Urinary tract infection, site not specified: Secondary | ICD-10-CM | POA: Diagnosis present

## 2019-07-12 LAB — BASIC METABOLIC PANEL
Anion gap: 11 (ref 5–15)
BUN: 30 mg/dL — ABNORMAL HIGH (ref 8–23)
CO2: 33 mmol/L — ABNORMAL HIGH (ref 22–32)
Calcium: 8.9 mg/dL (ref 8.9–10.3)
Chloride: 99 mmol/L (ref 98–111)
Creatinine, Ser: 1.48 mg/dL — ABNORMAL HIGH (ref 0.44–1.00)
GFR calc Af Amer: 39 mL/min — ABNORMAL LOW (ref >60)
GFR calc non Af Amer: 34 mL/min — ABNORMAL LOW (ref >60)
Glucose, Bld: 86 mg/dL (ref 70–99)
Potassium: 4.1 mmol/L (ref 3.5–5.1)
Sodium: 143 mmol/L (ref 135–145)

## 2019-07-12 LAB — PROCALCITONIN: Procalcitonin: <0.1 ng/mL

## 2019-07-12 LAB — TSH: TSH: 0.946 u[IU]/mL (ref 0.350–4.500)

## 2019-07-12 MED ORDER — FUROSEMIDE 10 MG/ML IJ SOLN
20.0000 mg | Freq: Two times a day (BID) | INTRAMUSCULAR | Status: DC
  Administered 2019-07-12: 20 mg via INTRAVENOUS
  Filled 2019-07-12: qty 4

## 2019-07-12 MED ORDER — ISOSORBIDE MONONITRATE ER 30 MG PO TB24
30.0000 mg | ORAL_TABLET | Freq: Every day | ORAL | Status: DC
  Administered 2019-07-12 – 2019-07-15 (×4): 30 mg via ORAL
  Filled 2019-07-12 (×4): qty 1

## 2019-07-12 MED ORDER — HALOPERIDOL LACTATE 5 MG/ML IJ SOLN
2.0000 mg | Freq: Four times a day (QID) | INTRAMUSCULAR | Status: DC | PRN
  Administered 2019-07-12 – 2019-07-15 (×5): 2 mg via INTRAMUSCULAR
  Filled 2019-07-12 (×5): qty 1

## 2019-07-12 MED ORDER — FUROSEMIDE 40 MG PO TABS
40.0000 mg | ORAL_TABLET | Freq: Once | ORAL | Status: AC
  Administered 2019-07-12: 40 mg via ORAL

## 2019-07-12 MED ORDER — ARFORMOTEROL TARTRATE 15 MCG/2ML IN NEBU
15.0000 ug | INHALATION_SOLUTION | Freq: Two times a day (BID) | RESPIRATORY_TRACT | Status: DC
  Administered 2019-07-12 – 2019-07-15 (×7): 15 ug via RESPIRATORY_TRACT
  Filled 2019-07-12 (×8): qty 2

## 2019-07-12 MED ORDER — POLYETHYLENE GLYCOL 3350 17 G PO PACK
17.0000 g | PACK | Freq: Every day | ORAL | Status: DC
  Administered 2019-07-12: 17 g via ORAL
  Filled 2019-07-12 (×3): qty 1

## 2019-07-12 MED ORDER — FUROSEMIDE 40 MG PO TABS
80.0000 mg | ORAL_TABLET | Freq: Every day | ORAL | Status: DC
  Administered 2019-07-13 – 2019-07-15 (×3): 80 mg via ORAL
  Filled 2019-07-12 (×4): qty 2

## 2019-07-12 MED ORDER — ALBUTEROL SULFATE (2.5 MG/3ML) 0.083% IN NEBU
2.5000 mg | INHALATION_SOLUTION | Freq: Four times a day (QID) | RESPIRATORY_TRACT | Status: DC | PRN
  Administered 2019-07-12 – 2019-07-13 (×2): 2.5 mg via RESPIRATORY_TRACT
  Filled 2019-07-12: qty 3

## 2019-07-12 MED ORDER — HALOPERIDOL 2 MG PO TABS
2.0000 mg | ORAL_TABLET | Freq: Four times a day (QID) | ORAL | Status: DC | PRN
  Filled 2019-07-12: qty 1

## 2019-07-12 MED ORDER — HEPARIN SODIUM (PORCINE) 5000 UNIT/ML IJ SOLN
5000.0000 [IU] | Freq: Three times a day (TID) | INTRAMUSCULAR | Status: DC
  Administered 2019-07-12 – 2019-07-15 (×8): 5000 [IU] via SUBCUTANEOUS
  Filled 2019-07-12 (×8): qty 1

## 2019-07-12 MED ORDER — IPRATROPIUM-ALBUTEROL 0.5-2.5 (3) MG/3ML IN SOLN
3.0000 mL | RESPIRATORY_TRACT | Status: DC | PRN
  Administered 2019-07-12 – 2019-07-13 (×2): 3 mL via RESPIRATORY_TRACT
  Filled 2019-07-12 (×3): qty 3

## 2019-07-12 MED ORDER — ONDANSETRON HCL 4 MG/2ML IJ SOLN: 4.0000 mg | Freq: Four times a day (QID) | INTRAMUSCULAR | Status: DC | PRN

## 2019-07-12 MED ORDER — ACETAMINOPHEN 325 MG PO TABS
650.0000 mg | ORAL_TABLET | ORAL | Status: DC | PRN
  Administered 2019-07-14 – 2019-07-15 (×2): 650 mg via ORAL
  Filled 2019-07-12 (×2): qty 2

## 2019-07-12 MED ORDER — ALBUTEROL SULFATE HFA 108 (90 BASE) MCG/ACT IN AERS: 2.0000 | INHALATION_SPRAY | Freq: Four times a day (QID) | RESPIRATORY_TRACT | Status: DC | PRN

## 2019-07-12 MED ORDER — QUETIAPINE FUMARATE 25 MG PO TABS
25.0000 mg | ORAL_TABLET | Freq: Every day | ORAL | Status: DC
  Administered 2019-07-12 – 2019-07-14 (×3): 25 mg via ORAL
  Filled 2019-07-12 (×3): qty 1

## 2019-07-12 MED ORDER — ASPIRIN EC 81 MG PO TBEC
81.0000 mg | DELAYED_RELEASE_TABLET | Freq: Every day | ORAL | Status: DC
  Administered 2019-07-12 – 2019-07-15 (×4): 81 mg via ORAL
  Filled 2019-07-12 (×4): qty 1

## 2019-07-12 MED ORDER — DIPHENHYDRAMINE HCL 50 MG/ML IJ SOLN
INTRAMUSCULAR | Status: AC
  Administered 2019-07-12: 6.5 mg
  Filled 2019-07-12: qty 1

## 2019-07-12 MED ORDER — SODIUM CHLORIDE 0.9 % IV SOLN: 250.0000 mL | INTRAVENOUS | Status: DC | PRN

## 2019-07-12 MED ORDER — SODIUM CHLORIDE 0.9% FLUSH
3.0000 mL | Freq: Two times a day (BID) | INTRAVENOUS | Status: DC
  Administered 2019-07-12 – 2019-07-15 (×7): 3 mL via INTRAVENOUS

## 2019-07-12 MED ORDER — SODIUM CHLORIDE 0.9% FLUSH: 3.0000 mL | INTRAVENOUS | Status: DC | PRN

## 2019-07-12 MED ORDER — LORAZEPAM 0.5 MG PO TABS
0.5000 mg | ORAL_TABLET | Freq: Every day | ORAL | Status: DC
  Administered 2019-07-12 – 2019-07-14 (×3): 0.5 mg via ORAL
  Filled 2019-07-12 (×3): qty 1

## 2019-07-12 MED ORDER — ONDANSETRON 4 MG PO TBDP: 4.0000 mg | ORAL_TABLET | Freq: Three times a day (TID) | ORAL | Status: DC | PRN

## 2019-07-12 MED ORDER — DIPHENHYDRAMINE HCL 50 MG/ML IJ SOLN: 6.2500 mg | Freq: Once | INTRAMUSCULAR | Status: AC

## 2019-07-12 MED ORDER — NITROGLYCERIN 0.4 MG SL SUBL: 0.4000 mg | SUBLINGUAL_TABLET | SUBLINGUAL | Status: DC | PRN

## 2019-07-12 MED ORDER — LEVOFLOXACIN IN D5W 500 MG/100ML IV SOLN
500.0000 mg | INTRAVENOUS | Status: DC
  Administered 2019-07-12: 500 mg via INTRAVENOUS
  Filled 2019-07-12: qty 100

## 2019-07-12 MED ORDER — POTASSIUM CHLORIDE CRYS ER 20 MEQ PO TBCR
20.0000 meq | EXTENDED_RELEASE_TABLET | Freq: Every day | ORAL | Status: DC
  Administered 2019-07-12 – 2019-07-15 (×4): 20 meq via ORAL
  Filled 2019-07-12 (×4): qty 1

## 2019-07-12 MED ORDER — LATANOPROST 0.005 % OP SOLN
1.0000 [drp] | Freq: Every day | OPHTHALMIC | Status: DC
  Administered 2019-07-14: 1 [drp] via OPHTHALMIC
  Filled 2019-07-12 (×2): qty 2.5

## 2019-07-12 MED ORDER — SENNOSIDES-DOCUSATE SODIUM 8.6-50 MG PO TABS
1.0000 | ORAL_TABLET | Freq: Every day | ORAL | Status: DC
  Administered 2019-07-12 – 2019-07-15 (×2): 1 via ORAL
  Filled 2019-07-12 (×3): qty 1

## 2019-07-12 MED ORDER — DULOXETINE HCL 30 MG PO CPEP
60.0000 mg | ORAL_CAPSULE | Freq: Every day | ORAL | Status: DC
  Administered 2019-07-12 – 2019-07-15 (×4): 60 mg via ORAL
  Filled 2019-07-12 (×4): qty 2

## 2019-07-12 NOTE — TOC Initial Note (Signed)
Transition of Care [facility]) - Initial/Assessment Note    Patient Details  Name: Cynthia [name] MRN: [id number] Date of Birth: 1940/12/23  [facility] CM/SW Contact:    Chapman Fitch, RN Phone Number: 07/12/2019, 4:40 PM  Clinical Narrative:                  Patient admitted from home after sustaining fall.  Patient lives at home with husband Patient with history of dementia.  Per husband [facility], and [facility].  Husband states that at baseline she is able to ambulate with a rollator.   RNCM spoke with [facility] RN Nicole Cella.  She is also the nurse on call this weekend.  If need to reach her over the weekend call [phone], and ask to speak to the on call nurse  RNCM spoke with Arita Miss MD Dr Arta Silence.  Per Dr Arta Silence goal would be to get patient back home with her husband, pending she is able to ambulate or transfer to wheelchair. PT eval pending, however they have been unable to work with her due to agitation  Per Dr Arta Silence, if patient is unable to ambulate or transfer they can potentially place her at SNF, but ultimately goal is to get her home with [facility] services.  If patient discharges over the weekend [facility] will un unable to provide transportation.    Expected Discharge Plan: Home w Home Health Services Barriers to Discharge: Continued Medical Work up   Patient Goals and CMS Choice        Expected Discharge Plan and Services Expected Discharge Plan: Home w Home Health Services   Discharge Planning Services: CM Consult   Living arrangements for the past 2 months: Single Family Home                                      Prior Living Arrangements/Services Living arrangements for the past 2 months: Single Family Home Lives with:: Spouse              Current home services: DME ([facility])    Activities of Daily Living Home Assistive Devices/Equipment: Environmental consultant (specify type) ADL Screening (condition at time of  admission) Patient's cognitive ability adequate to safely complete daily activities?: No Is the patient deaf or have difficulty hearing?: No Does the patient have difficulty seeing, even when wearing glasses/contacts?: No Does the patient have difficulty concentrating, remembering, or making decisions?: Yes Patient able to express need for assistance with ADLs?: Yes Does the patient have difficulty dressing or bathing?: Yes Independently performs ADLs?: No Communication: Needs assistance Is this a change from baseline?: Pre-admission baseline Dressing (OT): Needs assistance Is this a change from baseline?: Pre-admission baseline Grooming: Needs assistance Is this a change from baseline?: Pre-admission baseline Feeding: Needs assistance Is this a change from baseline?: Pre-admission baseline Toileting: Needs assistance Is this a change from baseline?: Pre-admission baseline In/Out Bed: Needs assistance Is this a change from baseline?: Pre-admission baseline Walks in Home: Needs assistance Is this a change from baseline?: Pre-admission baseline Does the patient have difficulty walking or climbing stairs?: Yes Weakness of Legs: Both Weakness of Arms/Hands: Both  Permission Sought/Granted                  Emotional Assessment              Admission diagnosis:  Lower urinary tract infectious disease [N39.0] Congestive heart failure, unspecified HF chronicity, unspecified heart failure type ([place]) [I50.9] AMS (altered mental status) [R41.82] Patient Active Problem List   Diagnosis Date Noted  . Acute lower UTI 07/12/2019  . AMS (altered mental status) 07/11/2019  . Sepsis ([place]) 11/14/2017  . CHF (congestive heart failure) ([place]) 08/09/2017  . COPD exacerbation ([place]) 03/26/2017  . Pelvic fracture ([place]) 04/30/2016  . HTN (hypertension) 04/30/2016  . HLD (hyperlipidemia) 04/30/2016  . GERD (gastroesophageal reflux disease) 04/30/2016  . CAD (coronary artery disease)  04/30/2016  . Chest pain 09/08/2014  . Dehydration 09/08/2014   PCP:  System, Pcp Not In Pharmacy:   [id number] - [place], [place] [address] Phone: [phone] Fax: [place], [place] - [address] [place] [address] Phone: [phone] Fax: [phone]     Social Determinants of Health (SDOH) Interventions    Readmission Risk Interventions No flowsheet data found.

## 2019-07-12 NOTE — Progress Notes (Signed)
PROGRESS NOTE    Cynthia Cootsris Whitfield Remmert  ZOX:[id number] DOB: 09/02/1940 DOA: 07/11/2019 PCP: System, Pcp Not In  Brief Narrative:  HPI: Cynthia [name] is a 79 y.o. female with medical history significant of dementia, coronary artery disease, with history of CABG, hyperlipidemia, hypertension, who [facility] today and sustained a fall with altered mental status.  She was found to be dehydrated and weak.  She was sent over to the ER for evaluation.  Patient unable to give any history.  Patient's husband said symptoms started about 3 AM last night where he had her fall off the couch.  Since then she has been having left knee pain.  She went to the clinic today where they noted the confusion.  She is demented to begin with but this is worse.  Sent over to the ER where she remains confused in the morning.  No fever no chills no nausea vomiting or diarrhea.  Patient noted to have possible UTI also.  She is being admitted therefore for further evaluation and treatment.  6/18: Patient seen and examined.  Arm extremely agitated.  Tearful, crying out.  Unable to redirect.  Upon repeat prompting was able to get the minimal history from her.  She states she tripped on her husband's foot and sustained a fall.  No clinical indicators of infection.  All antibiotics stopped.   Assessment & Plan:   Principal Problem:   AMS (altered mental status) Active Problems:   Dehydration   HTN (hypertension)   HLD (hyperlipidemia)   GERD (gastroesophageal reflux disease)   CAD (coronary artery disease)   COPD exacerbation (HCC)   CHF (congestive heart failure) (HCC)   Acute lower UTI  Acute metabolic encephalopathy Unclear etiology, likely multifactorial Dehydration, mechanical fall Suspect element of concussion Plan: Frequent reorienting measures Frequent neuro checks As needed Haldol Home regimen  Diastolic congestive heart failure Volume status appears euvolemic Continue home  diuretics  Acute hypoxic respiratory failure Patient on 2 L of oxygen at this time Attempt to taper  Urinary tract infection, ruled out Negative urinalysis Negative procalcitonin No fever elevated white count  COPD Respiratory status appears baseline Continue as needed nebulizers  Coronary artery disease Continue home medications  Hyperlipidemia Statin  GERD PPI  Hypertension Continue home medications  Depression Continue Cymbalta    DVT prophylaxis: Heparin subcu Code Status: DNR Family Communication: Left voicemail for husband Valma Cavaerrence Seebeck [phone] on 07/12/2019 Disposition Plan: Status is: Inpatient  Remains inpatient appropriate because:Altered mental status   Dispo: The patient is from: Home              Anticipated d/c is to: Home              Anticipated d/c date is: 1 day              Patient currently is not medically stable to d/c.        Consultants:   none  Procedures:   none  Antimicrobials:   None (received 1 dose of Rocephin)    Subjective: Seen and examined.  Very agitated and tearful this morning.  Objective: Vitals:   07/12/19 0252 07/12/19 0608 07/12/19 0744 07/12/19 1117  BP: (!) 141/82 138/89 (!) 156/78   Pulse: (!) 58 (!) 107 69   Resp: 19 19 17    Temp: 97.7 F (36.5 C) 98.3 F (36.8 C) 98 F (36.7 C)   TempSrc: Oral Oral Oral   SpO2: 94% 100% 93% 92%  Weight:  Height:        Intake/Output Summary (Last 24 hours) at 07/12/2019 1324 Last data filed at 07/12/2019 0500 Gross per 24 hour  Intake 10 ml  Output 2650 ml  Net -2640 ml   Filed Weights   07/11/19 1541  Weight: 91 kg    Examination:  General exam: Tearful and agitated Respiratory system: Clear to auscultation. Respiratory effort normal. Cardiovascular system: S1 & S2 heard, RRR. No JVD, murmurs, rubs, gallops or clicks. No pedal edema. Gastrointestinal system: Abdomen is nondistended, soft and nontender. No organomegaly or masses  felt. Normal bowel sounds heard. Central nervous system: Alert, oriented x1, no focal deficits Extremities: Symmetric 5 x 5 power. Skin: No rashes, lesions or ulcers Psychiatry: Tearful, agitated    Data Reviewed: I have personally reviewed following labs and imaging studies  CBC: Recent Labs  Lab 07/11/19 1555  WBC 4.0  NEUTROABS 2.4  HGB 11.9*  HCT 37.5  MCV 105.6*  PLT 156   Basic Metabolic Panel: Recent Labs  Lab 07/11/19 1555 07/12/19 0320  NA 141 143  K 4.2 4.1  CL 99 99  CO2 31 33*  GLUCOSE 95 86  BUN 30* 30*  CREATININE 1.53* 1.48*  CALCIUM 9.0 8.9   GFR: Estimated Creatinine Clearance: 39.7 mL/min (A) (by C-G formula based on SCr of 1.48 mg/dL (H)). Liver Function Tests: No results for input(s): AST, ALT, ALKPHOS, BILITOT, PROT, ALBUMIN in the last 168 hours. No results for input(s): LIPASE, AMYLASE in the last 168 hours. No results for input(s): AMMONIA in the last 168 hours. Coagulation Profile: No results for input(s): INR, PROTIME in the last 168 hours. Cardiac Enzymes: No results for input(s): CKTOTAL, CKMB, CKMBINDEX, TROPONINI in the last 168 hours. BNP (last 3 results) No results for input(s): PROBNP in the last 8760 hours. HbA1C: No results for input(s): HGBA1C in the last 72 hours. CBG: No results for input(s): GLUCAP in the last 168 hours. Lipid Profile: No results for input(s): CHOL, HDL, LDLCALC, TRIG, CHOLHDL, LDLDIRECT in the last 72 hours. Thyroid Function Tests: Recent Labs    07/12/19 0354  TSH 0.946   Anemia Panel: No results for input(s): VITAMINB12, FOLATE, FERRITIN, TIBC, IRON, RETICCTPCT in the last 72 hours. Sepsis Labs: Recent Labs  Lab 07/12/19 0320  PROCALCITON <0.10    Recent Results (from the past 240 hour(s))  SARS Coronavirus 2 by RT PCR (hospital order, performed in [facility] hospital lab) Nasopharyngeal Nasopharyngeal Swab     Status: None   Collection Time: 07/11/19 10:13 PM   Specimen: Nasopharyngeal  Swab  Result Value Ref Range Status   SARS Coronavirus 2 NEGATIVE NEGATIVE Final    Comment: (NOTE) SARS-CoV-2 target nucleic acids are NOT DETECTED.  The SARS-CoV-2 RNA is generally detectable in upper and lower respiratory specimens during the acute phase of infection. The lowest concentration of SARS-CoV-2 viral copies this assay can detect is 250 copies / mL. A negative result does not preclude SARS-CoV-2 infection and should not be used as the sole basis for treatment or other patient management decisions.  A negative result may occur with improper specimen collection / handling, submission of specimen other than nasopharyngeal swab, presence of viral mutation(s) within the areas targeted by this assay, and inadequate number of viral copies (<250 copies / mL). A negative result must be combined with clinical observations, patient history, and epidemiological information.  Fact Sheet for Patients:   BoilerBrush.com.cy  Fact Sheet for Healthcare Providers: https://pope.com/  This test is not  yet approved or  cleared by the [place] and has been authorized for detection and/or diagnosis of SARS-CoV-2 by FDA under an Emergency Use Authorization (EUA).  This EUA will remain in effect (meaning this test can be used) for the duration of the COVID-19 declaration under Section 564(b)(1) of the Act, 21 U.S.C. section 360bbb-3(b)(1), unless the authorization is terminated or revoked sooner.  Performed at [facility], [address] [place], [place] [address]          Radiology Studies: CT Head Wo Contrast  Result Date: 07/11/2019 CLINICAL DATA:  Fall EXAM: CT HEAD WITHOUT CONTRAST CT CERVICAL SPINE WITHOUT CONTRAST TECHNIQUE: Multidetector CT imaging of the head and cervical spine was performed following the standard protocol without intravenous contrast. Multiplanar CT image reconstructions of the cervical  spine were also generated. COMPARISON:  CT head 11/14/2017, CT cervical spine 05/16/2013 FINDINGS: CT HEAD FINDINGS Brain: No evidence of acute infarction, hemorrhage, hydrocephalus, extra-axial collection or mass lesion/mass effect. Symmetric prominence of the ventricles, cisterns and sulci compatible with parenchymal volume loss. Patchy areas of white matter hypoattenuation are most compatible with chronic microvascular angiopathy. Benign dural calcifications are similar to priors. Vascular: Atherosclerotic calcification of the carotid siphons. No hyperdense vessel. Skull: Frontal midline scalp swelling with crescentic hematoma measuring up to 6 mm in maximal thickness. No subjacent calvarial fracture or suspicious osseous lesions. No visible facial bone fracture within the included levels of imaging. Sinuses/Orbits: Paranasal sinuses are predominantly clear. Right mastoid air cells are clear. Trace left mastoid effusion. No visible or suspected temporal bone fracture. Orbital structures are unremarkable aside from prior lens extractions. Other: Edentulous with mild mandibular prognathism. CT CERVICAL SPINE FINDINGS Alignment: Stabilization collar absent at time of examination. There is mild straightening of the normal cervical lordosis. There is fairly pronounced rightward cranial rotation. No evidence of traumatic listhesis. No abnormally widened, perched or jumped facets. Normal alignment of the craniocervical and atlantoaxial articulations accounting for the degree of rotation. Skull base and vertebrae: Moderate arthrosis at the atlantodental interval hypertrophic spurring. Multilevel spondylitic changes are present in the cervical vertebral bodies as well as some sclerotic and cystic facet degenerative changes as well. No visible skull base fracture. No vertebral body fracture or height loss is seen. No worrisome osseous lesions. Soft tissues and spinal canal: No pre or paravertebral fluid or swelling. No  visible canal hematoma.There are extensive postsurgical changes in the left neck. Disc levels: Multilevel diffuse intervertebral disc height loss with corresponding spondylitic and facet degenerative changes in the spine. Posterior disc osteophyte complexes present at C4-5 and C5-6 result in at most mild canal narrowing. Some uncinate spurring and facet hypertrophic changes result in some mild multilevel neural foraminal narrowing with more moderate narrowing on the left at C3-4. Upper chest: Right apical lung hernia extending through the thoracic inlet. Extensive respiratory motion artifact limits the evaluation of the lung parenchyma. Other: 1.7 cm hypoattenuating nodule in the posterior right lobe thyroid gland. IMPRESSION: 1. Frontal midline scalp swelling with crescentic hematoma measuring up to 6 mm in maximal thickness. No subjacent calvarial fracture or acute intracranial abnormality. 2. Background of chronic microvascular angiopathy and parenchymal volume loss. 3. No evidence of acute fracture or traumatic listhesis of the cervical spine. 4. Multilevel spondylitic and facet degenerative changes of the cervical spine as described above. 5. Right apical lung hernia extending through the thoracic inlet. 6. 1.7 cm hypoattenuating nodule in the posterior right lobe thyroid gland. Consider further evaluation with nonemergent thyroid ultrasound.  This follows consensus guidelines: Managing Incidental Thyroid Nodules Detected on Imaging: White Paper of the ACR Incidental Thyroid Findings Committee. J Am Coll Radiol 2015; 12:143-150. and [facility] 3-tiered system for managing ITNs: J Am Coll Radiol. 2015; Feb;12(2): 143-50 Electronically Signed   By: Kreg Shropshire M.D.   On: 07/11/2019 16:33   CT Cervical Spine Wo Contrast  Result Date: 07/11/2019 CLINICAL DATA:  Fall EXAM: CT HEAD WITHOUT CONTRAST CT CERVICAL SPINE WITHOUT CONTRAST TECHNIQUE: Multidetector CT imaging of the head and cervical spine was performed  following the standard protocol without intravenous contrast. Multiplanar CT image reconstructions of the cervical spine were also generated. COMPARISON:  CT head 11/14/2017, CT cervical spine 05/16/2013 FINDINGS: CT HEAD FINDINGS Brain: No evidence of acute infarction, hemorrhage, hydrocephalus, extra-axial collection or mass lesion/mass effect. Symmetric prominence of the ventricles, cisterns and sulci compatible with parenchymal volume loss. Patchy areas of white matter hypoattenuation are most compatible with chronic microvascular angiopathy. Benign dural calcifications are similar to priors. Vascular: Atherosclerotic calcification of the carotid siphons. No hyperdense vessel. Skull: Frontal midline scalp swelling with crescentic hematoma measuring up to 6 mm in maximal thickness. No subjacent calvarial fracture or suspicious osseous lesions. No visible facial bone fracture within the included levels of imaging. Sinuses/Orbits: Paranasal sinuses are predominantly clear. Right mastoid air cells are clear. Trace left mastoid effusion. No visible or suspected temporal bone fracture. Orbital structures are unremarkable aside from prior lens extractions. Other: Edentulous with mild mandibular prognathism. CT CERVICAL SPINE FINDINGS Alignment: Stabilization collar absent at time of examination. There is mild straightening of the normal cervical lordosis. There is fairly pronounced rightward cranial rotation. No evidence of traumatic listhesis. No abnormally widened, perched or jumped facets. Normal alignment of the craniocervical and atlantoaxial articulations accounting for the degree of rotation. Skull base and vertebrae: Moderate arthrosis at the atlantodental interval hypertrophic spurring. Multilevel spondylitic changes are present in the cervical vertebral bodies as well as some sclerotic and cystic facet degenerative changes as well. No visible skull base fracture. No vertebral body fracture or height loss is  seen. No worrisome osseous lesions. Soft tissues and spinal canal: No pre or paravertebral fluid or swelling. No visible canal hematoma.There are extensive postsurgical changes in the left neck. Disc levels: Multilevel diffuse intervertebral disc height loss with corresponding spondylitic and facet degenerative changes in the spine. Posterior disc osteophyte complexes present at C4-5 and C5-6 result in at most mild canal narrowing. Some uncinate spurring and facet hypertrophic changes result in some mild multilevel neural foraminal narrowing with more moderate narrowing on the left at C3-4. Upper chest: Right apical lung hernia extending through the thoracic inlet. Extensive respiratory motion artifact limits the evaluation of the lung parenchyma. Other: 1.7 cm hypoattenuating nodule in the posterior right lobe thyroid gland. IMPRESSION: 1. Frontal midline scalp swelling with crescentic hematoma measuring up to 6 mm in maximal thickness. No subjacent calvarial fracture or acute intracranial abnormality. 2. Background of chronic microvascular angiopathy and parenchymal volume loss. 3. No evidence of acute fracture or traumatic listhesis of the cervical spine. 4. Multilevel spondylitic and facet degenerative changes of the cervical spine as described above. 5. Right apical lung hernia extending through the thoracic inlet. 6. 1.7 cm hypoattenuating nodule in the posterior right lobe thyroid gland. Consider further evaluation with nonemergent thyroid ultrasound. This follows consensus guidelines: Managing Incidental Thyroid Nodules Detected on Imaging: White Paper of the ACR Incidental Thyroid Findings Committee. J Am Coll Radiol 2015; 12:143-150. and [facility] 3-tiered system for managing ITNs:  J Am Coll Radiol. 2015; Feb;12(2): 143-50 Electronically Signed   By: Kreg Shropshire M.D.   On: 07/11/2019 16:33   DG Chest Portable 1 View  Result Date: 07/11/2019 CLINICAL DATA:  Hypoxia, fell, dementia EXAM: PORTABLE CHEST 1  VIEW COMPARISON:  07/16/2018 FINDINGS: Single frontal view of the chest demonstrates an enlarged cardiac silhouette. Stable postsurgical changes from median sternotomy. There is mild central vascular congestion without airspace disease, effusion, or pneumothorax. No acute displaced fracture. IMPRESSION: 1. Central vascular congestion.  No acute airspace disease. Electronically Signed   By: Sharlet Salina M.D.   On: 07/11/2019 16:14        Scheduled Meds: . arformoterol  15 mcg Nebulization BID  . aspirin EC  81 mg Oral Daily  . DULoxetine  60 mg Oral Daily  . furosemide  40 mg Oral Once  . furosemide  80 mg Oral Daily  . heparin  5,000 Units Subcutaneous Q8H  . isosorbide mononitrate  30 mg Oral Daily  . latanoprost  1 drop Both Eyes QHS  . LORazepam  0.5 mg Oral QHS  . polyethylene glycol  17 g Oral Daily  . potassium chloride SA  20 mEq Oral Daily  . QUEtiapine  25 mg Oral QHS  . senna-docusate  1 tablet Oral Daily  . sodium chloride flush  3 mL Intravenous Q12H   Continuous Infusions: . sodium chloride       LOS: 1 day    Time spent: 35 minutes    Tresa Moore, MD [facility] Pager [phone]  If 7PM-7AM, please contact night-coverage 07/12/2019, 1:24 PM

## 2019-07-12 NOTE — Progress Notes (Signed)
PT Cancellation Note  Patient Details Name: Cynthia [name] MRN: [id number] DOB: 1940-10-15   Cancelled Treatment:    Reason Eval/Treat Not Completed: Other (comment).  PT consult received.  Chart reviewed.  Nurse recommending holding therapy at this time (pt extremely agitated).  Will re-attempt PT evaluation at a later date/time as medically appropriate.  Hendricks Limes, PT 07/12/19, 10:15 AM

## 2019-07-12 NOTE — Progress Notes (Signed)
Cross Cover Brief Note Nurse called to report patient experiencing severe itchiness  With levaquin infusing.  Levaquin discontinued and benadryl ordered.  levaquin listed as allergy and monitoring for hives/anaphylaxis.  Patient with previous episodes of pruritic reaction to antibiotics.  At this time will hold ordering additional antibiotics as there is no nitrites in u/a, therefore will wait for culture.  Patient with acute chf  And likely to be cause of hjypoxia and no additional evidence of copd exacerbation. Procalcitonin  <0.10

## 2019-07-12 NOTE — Plan of Care (Signed)
Patient unable to verbalize understanding of current plan of care.

## 2019-07-12 NOTE — Progress Notes (Signed)
Patient complained of "extreme itchiness", RN requested Benadryl from provider-  Manuela Schwartz, NP

## 2019-07-13 LAB — BASIC METABOLIC PANEL
Anion gap: 13 (ref 5–15)
BUN: 30 mg/dL — ABNORMAL HIGH (ref 8–23)
CO2: 29 mmol/L (ref 22–32)
Calcium: 9.5 mg/dL (ref 8.9–10.3)
Chloride: 98 mmol/L (ref 98–111)
Creatinine, Ser: 1.46 mg/dL — ABNORMAL HIGH (ref 0.44–1.00)
GFR calc Af Amer: 40 mL/min — ABNORMAL LOW (ref >60)
GFR calc non Af Amer: 34 mL/min — ABNORMAL LOW (ref >60)
Glucose, Bld: 102 mg/dL — ABNORMAL HIGH (ref 70–99)
Potassium: 3.7 mmol/L (ref 3.5–5.1)
Sodium: 140 mmol/L (ref 135–145)

## 2019-07-13 MED ORDER — LOPERAMIDE HCL 2 MG PO CAPS
2.0000 mg | ORAL_CAPSULE | ORAL | Status: DC | PRN
  Administered 2019-07-13: 2 mg via ORAL
  Filled 2019-07-13 (×2): qty 1

## 2019-07-13 MED ORDER — PREDNISONE 20 MG PO TABS: 20.0000 mg | ORAL_TABLET | Freq: Every day | ORAL | Status: DC

## 2019-07-13 MED ORDER — METHYLPREDNISOLONE SODIUM SUCC 40 MG IJ SOLR
40.0000 mg | Freq: Two times a day (BID) | INTRAMUSCULAR | Status: AC
  Administered 2019-07-13 – 2019-07-14 (×2): 40 mg via INTRAVENOUS
  Filled 2019-07-13 (×2): qty 1

## 2019-07-13 MED ORDER — HYDRALAZINE HCL 20 MG/ML IJ SOLN
10.0000 mg | INTRAMUSCULAR | Status: DC | PRN
  Administered 2019-07-13: 10 mg via INTRAVENOUS
  Filled 2019-07-13: qty 1

## 2019-07-13 MED ORDER — LORAZEPAM 2 MG/ML IJ SOLN
0.5000 mg | Freq: Once | INTRAMUSCULAR | Status: AC
  Administered 2019-07-13: 0.5 mg via INTRAVENOUS
  Filled 2019-07-13: qty 1

## 2019-07-13 MED ORDER — HALOPERIDOL LACTATE 5 MG/ML IJ SOLN
2.0000 mg | Freq: Once | INTRAMUSCULAR | Status: AC
  Administered 2019-07-13: 2 mg via INTRAMUSCULAR
  Filled 2019-07-13: qty 1

## 2019-07-13 MED ORDER — MELATONIN 5 MG PO TABS
5.0000 mg | ORAL_TABLET | Freq: Every day | ORAL | Status: DC
  Administered 2019-07-14 (×2): 5 mg via ORAL
  Filled 2019-07-13 (×2): qty 1

## 2019-07-13 NOTE — Progress Notes (Signed)
Haldol given. Contantantly tries to get out of bed. No change from dose of haldol given. She has had about 7 loose stools today and 2 doses of imodium have been given. She is getting PRN neb treatments but still has wheezing. On 1L of oxygen via nasal cannula.

## 2019-07-13 NOTE — Progress Notes (Signed)
Notified Cynthia [name] about patient being restless even after haldol and ativan were given. Appropriate orders were placed.  Will continue to monitor.  [name]

## 2019-07-13 NOTE — Progress Notes (Signed)
PROGRESS NOTE    Cynthia [name]  ZOX:[id number] DOB: 09/24/1940 DOA: 07/11/2019 PCP: System, Pcp Not In  Brief Narrative:  HPI: Cynthia [name] is a 79 y.o. female with medical history significant of dementia, coronary artery disease, with history of CABG, hyperlipidemia, hypertension, who [facility] and sustained a fall with altered mental status.  She was found to be dehydrated and weak.  She was sent over to the ER for evaluation.  Patient unable to give any history.  Patient's husband said symptoms started about 3 AM last night where he had her fall off the couch.  Since then she has been having left knee pain.  She went to the clinic today where they noted the confusion.  She is demented to begin with but this is worse.  Sent over to the ER where she remains confused in the morning.  No fever no chills no nausea vomiting or diarrhea.  Patient noted to have possible UTI also.  She is being admitted therefore for further evaluation and treatment.  6/18: Patient seen and examined.  Arm extremely agitated.  Tearful, crying out.  Unable to redirect.  Upon repeat prompting was able to get the minimal history from her.  She states she tripped on her husband's foot and sustained a fall.  No clinical indicators of infection.  All antibiotics stopped.  6/19: Patient seen and examined.  Much calmer this morning, but still tearful.  Kidney function improving but not at baseline.  Encephalopathy is persistent     Assessment & Plan:   Principal Problem:   AMS (altered mental status) Active Problems:   Dehydration   HTN (hypertension)   HLD (hyperlipidemia)   GERD (gastroesophageal reflux disease)   CAD (coronary artery disease)   COPD exacerbation (HCC)   CHF (congestive heart failure) (HCC)   Acute lower UTI  Acute metabolic encephalopathy Unclear etiology, likely multifactorial Dehydration, mechanical fall Suspect element of concussion Appears to be improving  over interval but not at baseline Per patient Dr. The patient does have some history of dementia but is usually calm and communicative Plan: Frequent reorienting measures Frequent neuro checks As needed Haldol Home regimen  Diastolic congestive heart failure Volume status appears euvolemic Continue home diuretics  Acute hypoxic respiratory failure Patient on 2 L of oxygen at this time Attempt to taper Bronchodilators Prednisone 20mg  today, rapid taper  Urinary tract infection, ruled out Negative urinalysis Negative procalcitonin No fever elevated white count  COPD Respiratory status appears baseline Continue as needed nebulizers  Coronary artery disease Continue home medications  Hyperlipidemia Statin  GERD PPI  Hypertension Continue home medications  Depression Continue Cymbalta    DVT prophylaxis: Heparin subcu Code Status: DNR Family Communication: Left voicemail for husband Valma [name] [phone] on 07/12/2019 Disposition Plan: Status is: Inpatient  Remains inpatient appropriate because:Altered mental status   Dispo: The patient is from: Home              Anticipated d/c is to: Home              Anticipated d/c date is: 1 day              Patient currently is not medically stable to d/c.  Mental status not yet at baseline, though appears to be improving.  Patient still wheezing and dependent on 2 L of oxygen.  If mental status continues to improve and we are able to titrate off supplemental oxygen anticipate discharge on 07/14/2019  Consultants:   none  Procedures:   none  Antimicrobials:   None (received 1 dose of Rocephin)    Subjective: Seen and examined.  Still tearful but less agitated this morning  Objective: Vitals:   07/13/19 0603 07/13/19 1100 07/13/19 1246 07/13/19 1306  BP:  (!) 177/86 (!) 176/100   Pulse:  79 81   Resp:  20 (!) 24   Temp:  98.7 F (37.1 C) 97.9 F (36.6 C)   TempSrc:  Oral Oral   SpO2: 99%  93% 98% 96%  Weight:      Height:        Intake/Output Summary (Last 24 hours) at 07/13/2019 1312 Last data filed at 07/13/2019 0654 Gross per 24 hour  Intake 240 ml  Output 300 ml  Net -60 ml   Filed Weights   07/11/19 1541  Weight: 91 kg    Examination:  General exam: Tearful and agitated Respiratory system: Clear to auscultation. Respiratory effort normal. Cardiovascular system: S1 & S2 heard, RRR. No JVD, murmurs, rubs, gallops or clicks. No pedal edema. Gastrointestinal system: Abdomen is nondistended, soft and nontender. No organomegaly or masses felt. Normal bowel sounds heard. Central nervous system: Alert, oriented x1, no focal deficits Extremities: Symmetric 5 x 5 power. Skin: No rashes, lesions or ulcers Psychiatry: Tearful, agitated    Data Reviewed: I have personally reviewed following labs and imaging studies  CBC: Recent Labs  Lab 07/11/19 1555  WBC 4.0  NEUTROABS 2.4  HGB 11.9*  HCT 37.5  MCV 105.6*  PLT 156   Basic Metabolic Panel: Recent Labs  Lab 07/11/19 1555 07/12/19 0320 07/13/19 0502  NA 141 143 140  K 4.2 4.1 3.7  CL 99 99 98  CO2 31 33* 29  GLUCOSE 95 86 102*  BUN 30* 30* 30*  CREATININE 1.53* 1.48* 1.46*  CALCIUM 9.0 8.9 9.5   GFR: Estimated Creatinine Clearance: 40.3 mL/min (A) (by C-G formula based on SCr of 1.46 mg/dL (H)). Liver Function Tests: No results for input(s): AST, ALT, ALKPHOS, BILITOT, PROT, ALBUMIN in the last 168 hours. No results for input(s): LIPASE, AMYLASE in the last 168 hours. No results for input(s): AMMONIA in the last 168 hours. Coagulation Profile: No results for input(s): INR, PROTIME in the last 168 hours. Cardiac Enzymes: No results for input(s): CKTOTAL, CKMB, CKMBINDEX, TROPONINI in the last 168 hours. BNP (last 3 results) No results for input(s): PROBNP in the last 8760 hours. HbA1C: No results for input(s): HGBA1C in the last 72 hours. CBG: No results for input(s): GLUCAP in the last 168  hours. Lipid Profile: No results for input(s): CHOL, HDL, LDLCALC, TRIG, CHOLHDL, LDLDIRECT in the last 72 hours. Thyroid Function Tests: Recent Labs    07/12/19 0354  TSH 0.946   Anemia Panel: No results for input(s): VITAMINB12, FOLATE, FERRITIN, TIBC, IRON, RETICCTPCT in the last 72 hours. Sepsis Labs: Recent Labs  Lab 07/12/19 0320  PROCALCITON <0.10    Recent Results (from the past 240 hour(s))  SARS Coronavirus 2 by RT PCR (hospital order, performed in [facility] hospital lab) Nasopharyngeal Nasopharyngeal Swab     Status: None   Collection Time: 07/11/19 10:13 PM   Specimen: Nasopharyngeal Swab  Result Value Ref Range Status   SARS Coronavirus 2 NEGATIVE NEGATIVE Final    Comment: (NOTE) SARS-CoV-2 target nucleic acids are NOT DETECTED.  The SARS-CoV-2 RNA is generally detectable in upper and lower respiratory specimens during the acute phase of infection. The lowest concentration of SARS-CoV-2  viral copies this assay can detect is 250 copies / mL. A negative result does not preclude SARS-CoV-2 infection and should not be used as the sole basis for treatment or other patient management decisions.  A negative result may occur with improper specimen collection / handling, submission of specimen other than nasopharyngeal swab, presence of viral mutation(s) within the areas targeted by this assay, and inadequate number of viral copies (<250 copies / mL). A negative result must be combined with clinical observations, patient history, and epidemiological information.  Fact Sheet for Patients:   StrictlyIdeas.no  Fact Sheet for Healthcare Providers: BankingDealers.co.za  This test is not yet approved or  cleared by the [place] FDA and has been authorized for detection and/or diagnosis of SARS-CoV-2 by FDA under an Emergency Use Authorization (EUA).  This EUA will remain in effect (meaning this test can be used) for  the duration of the COVID-19 declaration under Section 564(b)(1) of the Act, 21 U.S.C. section 360bbb-3(b)(1), unless the authorization is terminated or revoked sooner.  Performed at [facility], [address], [place], [place] [address]          Radiology Studies: CT Head Wo Contrast  Result Date: 07/11/2019 CLINICAL DATA:  Fall EXAM: CT HEAD WITHOUT CONTRAST CT CERVICAL SPINE WITHOUT CONTRAST TECHNIQUE: Multidetector CT imaging of the head and cervical spine was performed following the standard protocol without intravenous contrast. Multiplanar CT image reconstructions of the cervical spine were also generated. COMPARISON:  CT head 11/14/2017, CT cervical spine 05/16/2013 FINDINGS: CT HEAD FINDINGS Brain: No evidence of acute infarction, hemorrhage, hydrocephalus, extra-axial collection or mass lesion/mass effect. Symmetric prominence of the ventricles, cisterns and sulci compatible with parenchymal volume loss. Patchy areas of white matter hypoattenuation are most compatible with chronic microvascular angiopathy. Benign dural calcifications are similar to priors. Vascular: Atherosclerotic calcification of the carotid siphons. No hyperdense vessel. Skull: Frontal midline scalp swelling with crescentic hematoma measuring up to 6 mm in maximal thickness. No subjacent calvarial fracture or suspicious osseous lesions. No visible facial bone fracture within the included levels of imaging. Sinuses/Orbits: Paranasal sinuses are predominantly clear. Right mastoid air cells are clear. Trace left mastoid effusion. No visible or suspected temporal bone fracture. Orbital structures are unremarkable aside from prior lens extractions. Other: Edentulous with mild mandibular prognathism. CT CERVICAL SPINE FINDINGS Alignment: Stabilization collar absent at time of examination. There is mild straightening of the normal cervical lordosis. There is fairly pronounced rightward cranial rotation. No evidence  of traumatic listhesis. No abnormally widened, perched or jumped facets. Normal alignment of the craniocervical and atlantoaxial articulations accounting for the degree of rotation. Skull base and vertebrae: Moderate arthrosis at the atlantodental interval hypertrophic spurring. Multilevel spondylitic changes are present in the cervical vertebral bodies as well as some sclerotic and cystic facet degenerative changes as well. No visible skull base fracture. No vertebral body fracture or height loss is seen. No worrisome osseous lesions. Soft tissues and spinal canal: No pre or paravertebral fluid or swelling. No visible canal hematoma.There are extensive postsurgical changes in the left neck. Disc levels: Multilevel diffuse intervertebral disc height loss with corresponding spondylitic and facet degenerative changes in the spine. Posterior disc osteophyte complexes present at C4-5 and C5-6 result in at most mild canal narrowing. Some uncinate spurring and facet hypertrophic changes result in some mild multilevel neural foraminal narrowing with more moderate narrowing on the left at C3-4. Upper chest: Right apical lung hernia extending through the thoracic inlet. Extensive respiratory motion artifact limits  the evaluation of the lung parenchyma. Other: 1.7 cm hypoattenuating nodule in the posterior right lobe thyroid gland. IMPRESSION: 1. Frontal midline scalp swelling with crescentic hematoma measuring up to 6 mm in maximal thickness. No subjacent calvarial fracture or acute intracranial abnormality. 2. Background of chronic microvascular angiopathy and parenchymal volume loss. 3. No evidence of acute fracture or traumatic listhesis of the cervical spine. 4. Multilevel spondylitic and facet degenerative changes of the cervical spine as described above. 5. Right apical lung hernia extending through the thoracic inlet. 6. 1.7 cm hypoattenuating nodule in the posterior right lobe thyroid gland. Consider further  evaluation with nonemergent thyroid ultrasound. This follows consensus guidelines: Managing Incidental Thyroid Nodules Detected on Imaging: White Paper of the ACR Incidental Thyroid Findings Committee. J Am Coll Radiol 2015; 12:143-150. and [facility] 3-tiered system for managing ITNs: J Am Coll Radiol. 2015; Feb;12(2): 143-50 Electronically Signed   By: Kreg Shropshire M.D.   On: 07/11/2019 16:33   CT Cervical Spine Wo Contrast  Result Date: 07/11/2019 CLINICAL DATA:  Fall EXAM: CT HEAD WITHOUT CONTRAST CT CERVICAL SPINE WITHOUT CONTRAST TECHNIQUE: Multidetector CT imaging of the head and cervical spine was performed following the standard protocol without intravenous contrast. Multiplanar CT image reconstructions of the cervical spine were also generated. COMPARISON:  CT head 11/14/2017, CT cervical spine 05/16/2013 FINDINGS: CT HEAD FINDINGS Brain: No evidence of acute infarction, hemorrhage, hydrocephalus, extra-axial collection or mass lesion/mass effect. Symmetric prominence of the ventricles, cisterns and sulci compatible with parenchymal volume loss. Patchy areas of white matter hypoattenuation are most compatible with chronic microvascular angiopathy. Benign dural calcifications are similar to priors. Vascular: Atherosclerotic calcification of the carotid siphons. No hyperdense vessel. Skull: Frontal midline scalp swelling with crescentic hematoma measuring up to 6 mm in maximal thickness. No subjacent calvarial fracture or suspicious osseous lesions. No visible facial bone fracture within the included levels of imaging. Sinuses/Orbits: Paranasal sinuses are predominantly clear. Right mastoid air cells are clear. Trace left mastoid effusion. No visible or suspected temporal bone fracture. Orbital structures are unremarkable aside from prior lens extractions. Other: Edentulous with mild mandibular prognathism. CT CERVICAL SPINE FINDINGS Alignment: Stabilization collar absent at time of examination. There is mild  straightening of the normal cervical lordosis. There is fairly pronounced rightward cranial rotation. No evidence of traumatic listhesis. No abnormally widened, perched or jumped facets. Normal alignment of the craniocervical and atlantoaxial articulations accounting for the degree of rotation. Skull base and vertebrae: Moderate arthrosis at the atlantodental interval hypertrophic spurring. Multilevel spondylitic changes are present in the cervical vertebral bodies as well as some sclerotic and cystic facet degenerative changes as well. No visible skull base fracture. No vertebral body fracture or height loss is seen. No worrisome osseous lesions. Soft tissues and spinal canal: No pre or paravertebral fluid or swelling. No visible canal hematoma.There are extensive postsurgical changes in the left neck. Disc levels: Multilevel diffuse intervertebral disc height loss with corresponding spondylitic and facet degenerative changes in the spine. Posterior disc osteophyte complexes present at C4-5 and C5-6 result in at most mild canal narrowing. Some uncinate spurring and facet hypertrophic changes result in some mild multilevel neural foraminal narrowing with more moderate narrowing on the left at C3-4. Upper chest: Right apical lung hernia extending through the thoracic inlet. Extensive respiratory motion artifact limits the evaluation of the lung parenchyma. Other: 1.7 cm hypoattenuating nodule in the posterior right lobe thyroid gland. IMPRESSION: 1. Frontal midline scalp swelling with crescentic hematoma measuring up to 6 mm in  maximal thickness. No subjacent calvarial fracture or acute intracranial abnormality. 2. Background of chronic microvascular angiopathy and parenchymal volume loss. 3. No evidence of acute fracture or traumatic listhesis of the cervical spine. 4. Multilevel spondylitic and facet degenerative changes of the cervical spine as described above. 5. Right apical lung hernia extending through the  thoracic inlet. 6. 1.7 cm hypoattenuating nodule in the posterior right lobe thyroid gland. Consider further evaluation with nonemergent thyroid ultrasound. This follows consensus guidelines: Managing Incidental Thyroid Nodules Detected on Imaging: White Paper of the ACR Incidental Thyroid Findings Committee. J Am Coll Radiol 2015; 12:143-150. and [facility] 3-tiered system for managing ITNs: J Am Coll Radiol. 2015; Feb;12(2): 143-50 Electronically Signed   By: Kreg Shropshire M.D.   On: 07/11/2019 16:33   DG Chest Portable 1 View  Result Date: 07/11/2019 CLINICAL DATA:  Hypoxia, fell, dementia EXAM: PORTABLE CHEST 1 VIEW COMPARISON:  07/16/2018 FINDINGS: Single frontal view of the chest demonstrates an enlarged cardiac silhouette. Stable postsurgical changes from median sternotomy. There is mild central vascular congestion without airspace disease, effusion, or pneumothorax. No acute displaced fracture. IMPRESSION: 1. Central vascular congestion.  No acute airspace disease. Electronically Signed   By: Sharlet Salina M.D.   On: 07/11/2019 16:14        Scheduled Meds: . arformoterol  15 mcg Nebulization BID  . aspirin EC  81 mg Oral Daily  . DULoxetine  60 mg Oral Daily  . furosemide  80 mg Oral Daily  . heparin  5,000 Units Subcutaneous Q8H  . isosorbide mononitrate  30 mg Oral Daily  . latanoprost  1 drop Both Eyes QHS  . LORazepam  0.5 mg Oral QHS  . polyethylene glycol  17 g Oral Daily  . potassium chloride SA  20 mEq Oral Daily  . [START ON 07/14/2019] predniSONE  20 mg Oral Q breakfast  . QUEtiapine  25 mg Oral QHS  . senna-docusate  1 tablet Oral Daily  . sodium chloride flush  3 mL Intravenous Q12H   Continuous Infusions: . sodium chloride       LOS: 2 days    Time spent: 35 minutes    Tresa Moore, MD [facility] Pager [phone]  If 7PM-7AM, please contact night-coverage 07/13/2019, 1:12 PM

## 2019-07-13 NOTE — Progress Notes (Signed)
C. Diff sample to be obtained. Nights shift RN notified of sample needed. Tele sitter ordered as the patient tries to get out of bed constantly, removes nasal cannula, Pulls pure wick off. Low bed ordered. Floor mats applied, two doses of IM haldol given, one dose or ativan given. Alert and oriented x2.

## 2019-07-13 NOTE — Evaluation (Signed)
Physical Therapy Evaluation Patient Details Name: Cynthia [name] MRN: [id number] DOB: 15-Mar-1940 Today's Date: 07/13/2019   History of Present Illness  Pt is a 79 y.o. female presenting to hospital 6/17 s/p fall off couch and AMS; per chart clinic did knee x-ray which did not show any obvious fx's.  Pt admitted with AMS, dehydration, and fall.  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  PMH includes CAD, dementia, CABG, htn, CHF. COPD, pelvic fx, L hip sx.  [facility] participant.  Clinical Impression  Prior to hospital admission, pt reports being ambulatory with RW; lives with her husband; is a Field seismologist.  Currently pt is min assist semi-supine to sitting edge of bed; min to mod assist with transfers; and CGA to min assist to ambulate a few feet bed to recliner with RW (limited distance d/t increased SOB and wheezing with activity; O2 sats 90% or greater on room air during sessions activities).  2nd assist present for safety during functional mobility.  Pt would benefit from skilled PT to address noted impairments and functional limitations (see below for any additional details).  Upon hospital discharge, pt would benefit from STR.    Follow Up Recommendations SNF    Equipment Recommendations  Rolling walker with 5" wheels;3in1 (PT)    Recommendations for Other Services       Precautions / Restrictions Precautions Precautions: Fall Restrictions Weight Bearing Restrictions: No      Mobility  Bed Mobility Overal bed mobility: Needs Assistance Bed Mobility: Supine to Sit     Supine to sit: Min assist;HOB elevated     General bed mobility comments: minimal assist for trunk and L LE semi-supine to sitting edge of bed  Transfers Overall transfer level: Needs assistance Equipment used: Rolling walker (2  wheeled) Transfers: Sit to/from Stand Sit to Stand: Min assist;Mod assist;+2 safety/equipment         General transfer comment: assist to initiate and come to full stand up to RW; assist to control descent sitting down in recliner  Ambulation/Gait Ambulation/Gait assistance: Min guard;Min assist;+2 safety/equipment Gait Distance (Feet): 3 Feet (bed to recliner) Assistive device: Rolling walker (2 wheeled)       General Gait Details: antalgic; vc's for safety  Stairs            Wheelchair Mobility    Modified Rankin (Stroke Patients Only)       Balance Overall balance assessment: Needs assistance Sitting-balance support: No upper extremity supported;Feet supported Sitting balance-Leahy Scale: Good Sitting balance - Comments: steady sitting reaching within BOS   Standing balance support: Single extremity supported Standing balance-Leahy Scale: Poor Standing balance comment: pt requiring at least single UE support for static standing balance                             Pertinent Vitals/Pain Pain Assessment: No/denies pain  Vitals (HR and O2 on room air) stable and WFL throughout treatment session.    Home Living Family/patient expects to be discharged to:: Private residence Living Arrangements: Spouse/significant other Available Help at Discharge: Family ([facility] program participant) Type of Home: Apartment Home Access: Stairs to enter Entrance Stairs-Rails: Doctor, general practice of Steps: 4 Home Layout: One level   Additional Comments: Pt reports using a RW    Prior Function Level of Independence: Needs assistance  Gait / Transfers Assistance Needed: Pt reports ambulating with RW.           Hand Dominance        Extremity/Trunk Assessment   Upper Extremity Assessment Upper Extremity Assessment: Generalized weakness    Lower Extremity Assessment Lower Extremity Assessment: LLE deficits/detail;RLE deficits/detail RLE  Deficits / Details: strength and ROM WFL LLE Deficits / Details: hip flexion at least 3/5; knee flexion/extension at least 2+/5 (limited d/t knee pain); DF/PF at least 3/5 AROM    Cervical / Trunk Assessment Cervical / Trunk Assessment: Normal  Communication   Communication: No difficulties  Cognition Arousal/Alertness: Awake/alert Behavior During Therapy: Impulsive (mildly agitated at times but able to calm quickly) Overall Cognitive Status: No family/caregiver present to determine baseline cognitive functioning                                 General Comments: Oriented to person, place, and month/year      General Comments   Nursing cleared pt for participation in physical therapy.  Pt agreeable to PT session.    Exercises  Bed mobility and transfers   Assessment/Plan    PT Assessment Patient needs continued PT services  PT Problem List Decreased strength;Decreased activity tolerance;Decreased balance;Decreased mobility;Decreased cognition;Decreased knowledge of use of DME;Decreased knowledge of precautions;Decreased safety awareness;Cardiopulmonary status limiting activity       PT Treatment Interventions DME instruction;Gait training;Stair training;Functional mobility training;Therapeutic activities;Therapeutic exercise;Balance training;Patient/family education    PT Goals (Current goals can be found in the Care Plan section)  Acute Rehab PT Goals Patient Stated Goal: to be able to get up and walk PT Goal Formulation: With patient Time For Goal Achievement: 07/27/19 Potential to Achieve Goals: Good    Frequency Min 2X/week   Barriers to discharge   Question level of support available    Co-evaluation               AM-PAC PT "6 Clicks" Mobility  Outcome Measure Help needed turning from your back to your side while in a flat bed without using bedrails?: None Help needed moving from lying on your back to sitting on the side of a flat bed without  using bedrails?: A Little Help needed moving to and from a bed to a chair (including a wheelchair)?: A Little Help needed standing up from a chair using your arms (e.g., wheelchair or bedside chair)?: A Lot Help needed to walk in hospital room?: A Little Help needed climbing 3-5 steps with a railing? : A Lot 6 Click Score: 17    End of Session Equipment Utilized During Treatment: Gait belt Activity Tolerance: Patient tolerated treatment well Patient left: in chair;with call bell/phone within reach;with chair alarm set Nurse Communication: Mobility status;Precautions;Other (comment) (pt's "wheezing" with activity) PT Visit Diagnosis: Unsteadiness on feet (R26.81);Other abnormalities of gait and mobility (R26.89);Muscle weakness (generalized) (M62.81);History of falling (Z91.81);Difficulty in walking, not elsewhere classified (R26.2)    Time: 1135-1200 PT Time Calculation (min) (ACUTE ONLY): 25 min   Charges:   PT Evaluation $PT Eval Low Complexity: 1 Low PT Treatments $Therapeutic Activity: 8-22 mins       Leitha Bleak, PT 07/13/19, 2:49 PM

## 2019-07-14 LAB — BASIC METABOLIC PANEL
Anion gap: 14 (ref 5–15)
BUN: 27 mg/dL — ABNORMAL HIGH (ref 8–23)
CO2: 29 mmol/L (ref 22–32)
Calcium: 10 mg/dL (ref 8.9–10.3)
Chloride: 98 mmol/L (ref 98–111)
Creatinine, Ser: 1.4 mg/dL — ABNORMAL HIGH (ref 0.44–1.00)
GFR calc Af Amer: 42 mL/min — ABNORMAL LOW (ref >60)
GFR calc non Af Amer: 36 mL/min — ABNORMAL LOW (ref >60)
Glucose, Bld: 112 mg/dL — ABNORMAL HIGH (ref 70–99)
Potassium: 3.8 mmol/L (ref 3.5–5.1)
Sodium: 141 mmol/L (ref 135–145)

## 2019-07-14 LAB — C DIFFICILE QUICK SCREEN W PCR REFLEX
C Diff antigen: NEGATIVE
C Diff interpretation: NOT DETECTED
C Diff toxin: NEGATIVE

## 2019-07-14 LAB — URINE CULTURE: Culture: >=100000 — AB

## 2019-07-14 MED ORDER — TRAZODONE HCL 50 MG PO TABS
50.0000 mg | ORAL_TABLET | Freq: Every day | ORAL | Status: DC
  Administered 2019-07-14: 50 mg via ORAL
  Filled 2019-07-14: qty 1

## 2019-07-14 MED ORDER — METHYLPREDNISOLONE SODIUM SUCC 40 MG IJ SOLR
20.0000 mg | Freq: Two times a day (BID) | INTRAMUSCULAR | Status: AC
  Administered 2019-07-14 – 2019-07-15 (×2): 20 mg via INTRAVENOUS
  Filled 2019-07-14 (×2): qty 1

## 2019-07-14 NOTE — Progress Notes (Signed)
PROGRESS NOTE    Cynthia [name]  YEM:[id number] DOB: [birth year] DOA: 07/11/2019 PCP: System, Pcp Not In  Brief Narrative:  HPI: Cynthia [name] is a 79 y.o. female with medical history significant of dementia, coronary artery disease, with history of CABG, hyperlipidemia, hypertension, who was at the [facility] program today and sustained a fall with altered mental status.  She was found to be dehydrated and weak.  She was sent over to the ER for evaluation.  Patient unable to give any history.  Patient's husband said symptoms started about 3 AM last night where he had her fall off the couch.  Since then she has been having left knee pain.  She went to the clinic today where they noted the confusion.  She is demented to begin with but this is worse.  Sent over to the ER where she remains confused in the morning.  No fever no chills no nausea vomiting or diarrhea.  Patient noted to have possible UTI also.  She is being admitted therefore for further evaluation and treatment.  6/18: Patient seen and examined.  Arm extremely agitated.  Tearful, crying out.  Unable to redirect.  Upon repeat prompting was able to get the minimal history from her.  She states she tripped on her husband's foot and sustained a fall.  No clinical indicators of infection.  All antibiotics stopped.  6/19: Patient seen and examined.  Much calmer this morning, but still tearful.  Kidney function improving but not at baseline.  Encephalopathy is persistent  6/20: Patient seen and examined.  Appears somewhat lethargic this morning.  Apparently per bedside nurse the patient only slept 1 or 2 hours last night.  Was agitated despite administration of IM Haldol and IV Ativan in addition to home psychiatric regimen.     Assessment & Plan:   Principal Problem:   AMS (altered mental status) Active Problems:   Dehydration   HTN (hypertension)   HLD (hyperlipidemia)   GERD (gastroesophageal reflux disease)   CAD (coronary  artery disease)   COPD exacerbation (HCC)   CHF (congestive heart failure) (HCC)   Acute lower UTI  Acute metabolic encephalopathy Unclear etiology, likely multifactorial Dehydration, mechanical fall Suspect element of concussion Appears to be improving over interval but not at baseline Per outpatient provider The patient does have some history of dementia but is usually calm and communicative Plan: Frequent reorienting measures Frequent neuro checks As needed Haldol As needed zyprexa Home regimen  Diastolic congestive heart failure Volume status appears euvolemic Continue home diuretics  Acute hypoxic respiratory failure Patient titrated to room air Still with some wheezing Continue bronchodilators Continue low-dose IV Solu-Medrol Plan to transition to Medrol Dosepak from tomorrow  Urinary tract infection, ruled out Negative urinalysis Negative procalcitonin No fever elevated white count  COPD Respiratory status appears baseline Continue as needed nebulizers  Coronary artery disease Continue home medications  Hyperlipidemia Statin  GERD PPI  Hypertension Continue home medications  Depression Continue Cymbalta    DVT prophylaxis: Heparin subcu Code Status: DNR Family Communication: Left voicemail for husband Cynthia [name] [phone] on 07/12/2019 Disposition Plan: Status is: Inpatient  Remains inpatient appropriate because:Altered mental status   Dispo: The patient is from: Home              Anticipated d/c is to: Home              Anticipated d/c date is: 1 day  Patient currently is not medically stable to d/c.  Mental status not yet at baseline, though appears to be improving.  Patient still wheezing though she has been titrated to room air.  If the patient remains relatively stable and calm overnight anticipate medical readiness for discharge on 07/15/2019.  The patient should return back to [facility] and then can transition to home from  there.     Consultants:   none  Procedures:   none  Antimicrobials:   None (received 1 dose of Rocephin)    Subjective: Seen and examined.  Still tearful but less agitated this morning  Objective: Vitals:   07/13/19 2021 07/14/19 0456 07/14/19 0500 07/14/19 0758  BP:  (!) 158/87    Pulse:  83    Resp:  20    Temp:  98.4 F (36.9 C)    TempSrc:  Oral    SpO2: 97% 98%  95%  Weight:   91 kg   Height:        Intake/Output Summary (Last 24 hours) at 07/14/2019 1126 Last data filed at 07/14/2019 0900 Gross per 24 hour  Intake --  Output 1400 ml  Net -1400 ml   Filed Weights   07/11/19 1541 07/14/19 0500  Weight: 91 kg 91 kg    Examination:  General exam: Tearful and agitated Respiratory system: Clear to auscultation. Respiratory effort normal. Cardiovascular system: S1 & S2 heard, RRR. No JVD, murmurs, rubs, gallops or clicks. No pedal edema. Gastrointestinal system: Abdomen is nondistended, soft and nontender. No organomegaly or masses felt. Normal bowel sounds heard. Central nervous system: Alert, oriented x1, no focal deficits Extremities: Symmetric 5 x 5 power. Skin: No rashes, lesions or ulcers Psychiatry: Tearful, agitated    Data Reviewed: I have personally reviewed following labs and imaging studies  CBC: Recent Labs  Lab 07/11/19 1555  WBC 4.0  NEUTROABS 2.4  HGB 11.9*  HCT 37.5  MCV 105.6*  PLT 156   Basic Metabolic Panel: Recent Labs  Lab 07/11/19 1555 07/12/19 0320 07/13/19 0502 07/14/19 0620  NA 141 143 140 141  K 4.2 4.1 3.7 3.8  CL 99 99 98 98  CO2 31 33* 29 29  GLUCOSE 95 86 102* 112*  BUN 30* 30* 30* 27*  CREATININE 1.53* 1.48* 1.46* 1.40*  CALCIUM 9.0 8.9 9.5 10.0   GFR: Estimated Creatinine Clearance: 42 mL/min (A) (by C-G formula based on SCr of 1.4 mg/dL (H)). Liver Function Tests: No results for input(s): AST, ALT, ALKPHOS, BILITOT, PROT, ALBUMIN in the last 168 hours. No results for input(s): LIPASE, AMYLASE  in the last 168 hours. No results for input(s): AMMONIA in the last 168 hours. Coagulation Profile: No results for input(s): INR, PROTIME in the last 168 hours. Cardiac Enzymes: No results for input(s): CKTOTAL, CKMB, CKMBINDEX, TROPONINI in the last 168 hours. BNP (last 3 results) No results for input(s): PROBNP in the last 8760 hours. HbA1C: No results for input(s): HGBA1C in the last 72 hours. CBG: No results for input(s): GLUCAP in the last 168 hours. Lipid Profile: No results for input(s): CHOL, HDL, LDLCALC, TRIG, CHOLHDL, LDLDIRECT in the last 72 hours. Thyroid Function Tests: Recent Labs    07/12/19 0354  TSH 0.946   Anemia Panel: No results for input(s): VITAMINB12, FOLATE, FERRITIN, TIBC, IRON, RETICCTPCT in the last 72 hours. Sepsis Labs: Recent Labs  Lab 07/12/19 0320  PROCALCITON <0.10    Recent Results (from the past 240 hour(s))  Urine Culture     Status:  Abnormal   Collection Time: 07/11/19  3:55 PM   Specimen: Urine, Random  Result Value Ref Range Status   Specimen Description   Final    URINE, RANDOM Performed at [facility], [address], [place], [place] [address]    Special Requests   Final    NONE Performed at [facility], [address] [place], [place] [address]    Culture (A)  Final    >=100,000 COLONIES/mL AEROCOCCUS URINAE Standardized susceptibility testing for this organism is not available. Performed at [facility], [place] [address] [place], [place] [address]    Report Status 07/14/2019 FINAL  Final  SARS Coronavirus 2 by RT PCR (hospital order, performed in [facility] hospital lab) Nasopharyngeal Nasopharyngeal Swab     Status: None   Collection Time: 07/11/19 10:13 PM   Specimen: Nasopharyngeal Swab  Result Value Ref Range Status   SARS Coronavirus 2 NEGATIVE NEGATIVE Final    Comment: (NOTE) SARS-CoV-2 target nucleic acids are NOT DETECTED.  The SARS-CoV-2 RNA is generally detectable in upper and  lower respiratory specimens during the acute phase of infection. The lowest concentration of SARS-CoV-2 viral copies this assay can detect is 250 copies / mL. A negative result does not preclude SARS-CoV-2 infection and should not be used as the sole basis for treatment or other patient management decisions.  A negative result may occur with improper specimen collection / handling, submission of specimen other than nasopharyngeal swab, presence of viral mutation(s) within the areas targeted by this assay, and inadequate number of viral copies (<250 copies / mL). A negative result must be combined with clinical observations, patient history, and epidemiological information.  Fact Sheet for Patients:   StrictlyIdeas.no  Fact Sheet for Healthcare Providers: BankingDealers.co.za  This test is not yet approved or  cleared by the [place] FDA and has been authorized for detection and/or diagnosis of SARS-CoV-2 by FDA under an Emergency Use Authorization (EUA).  This EUA will remain in effect (meaning this test can be used) for the duration of the COVID-19 declaration under Section 564(b)(1) of the Act, 21 U.S.C. section 360bbb-3(b)(1), unless the authorization is terminated or revoked sooner.  Performed at [facility], [address], [place],  [address]          Radiology Studies: No results found.      Scheduled Meds: . arformoterol  15 mcg Nebulization BID  . aspirin EC  81 mg Oral Daily  . DULoxetine  60 mg Oral Daily  . furosemide  80 mg Oral Daily  . heparin  5,000 Units Subcutaneous Q8H  . isosorbide mononitrate  30 mg Oral Daily  . latanoprost  1 drop Both Eyes QHS  . LORazepam  0.5 mg Oral QHS  . melatonin  5 mg Oral QHS  . methylPREDNISolone (SOLU-MEDROL) injection  20 mg Intravenous Q12H  . polyethylene glycol  17 g Oral Daily  . potassium chloride SA  20 mEq Oral Daily  . QUEtiapine  25  mg Oral QHS  . senna-docusate  1 tablet Oral Daily  . sodium chloride flush  3 mL Intravenous Q12H  . traZODone  50 mg Oral QHS   Continuous Infusions: . sodium chloride       LOS: 3 days    Time spent: 35 minutes    Sidney Ace, MD [facility] Pager [phone]  If 7PM-7AM, please contact night-coverage 07/14/2019, 11:26 AM

## 2019-07-15 LAB — BASIC METABOLIC PANEL
Anion gap: 11 (ref 5–15)
BUN: 26 mg/dL — ABNORMAL HIGH (ref 8–23)
CO2: 29 mmol/L (ref 22–32)
Calcium: 9.6 mg/dL (ref 8.9–10.3)
Chloride: 100 mmol/L (ref 98–111)
Creatinine, Ser: 1.49 mg/dL — ABNORMAL HIGH (ref 0.44–1.00)
GFR calc Af Amer: 39 mL/min — ABNORMAL LOW (ref >60)
GFR calc non Af Amer: 33 mL/min — ABNORMAL LOW (ref >60)
Glucose, Bld: 113 mg/dL — ABNORMAL HIGH (ref 70–99)
Potassium: 3.7 mmol/L (ref 3.5–5.1)
Sodium: 140 mmol/L (ref 135–145)

## 2019-07-15 NOTE — TOC Transition Note (Signed)
Transition of Care [facility]) - CM/SW Discharge Note   Patient Details  Name: Cynthia [name] MRN: [id number] Date of Birth: May 03, 1940  Transition of Care [facility]) CM/SW Contact:  Chapman Fitch, RN Phone Number: 07/15/2019, 2:15 PM   Clinical Narrative:     Patient to discharge home today [facility] has reviewed clinical and in agreement with discharge to home.  They have Notified husband that patient would return home today  Olegario Messier with [facility] has set up [facility] transport.  Patient will be taken to the [facility] clinic prior to returning home.  [facility] has access to epic so no clinical needs to be sent   Final next level of care: Home w Home Health Services ([facility]) Barriers to Discharge: No Barriers Identified   Patient Goals and CMS Choice        Discharge Placement                       Discharge Plan and Services   Discharge Planning Services: CM Consult                                 Social Determinants of Health (SDOH) Interventions     Readmission Risk Interventions No flowsheet data found.

## 2019-07-15 NOTE — Plan of Care (Signed)
Patient discharging home with [facility].  [facility] transporting to center then home.  Son and Husband aware and Clinical research associate reviewed discharge instructions. Discharge instruction given to [facility] NA at discharge along with DNR and MOST. Patient w/c brought with patient.  Patient placed in cotton hospital gown and her shorts and shoes.  No shirt with belongings. PIV  removed with tip intact and external catherter removed.  Placed mesh panties and pad for incontinence on ride.  No needs verbalized.

## 2019-07-15 NOTE — Discharge Instructions (Signed)
Heart Failure, Self Care Heart failure is a serious condition. This document explains the things you need to do to take care of yourself after a heart failure diagnosis. You may be asked to change your diet, take certain medicines, and make other lifestyle changes in order to stay as healthy as possible. Your health care provider may also give you more specific instructions. If you have problems or questions, contact your health care provider. What are the risks? Having heart failure puts you at higher risk for certain problems. These problems can get worse if you do not take good care of yourself. Problems may include:  Blood clotting problems. This may cause a stroke.  Damage to the kidneys, liver, or lungs.  Abnormal heart rhythms. Supplies needed:  Scale for monitoring weight.  Blood pressure monitor.  Notebook.  Medicines. How to care for yourself when you have heart failure Medicines Take over-the-counter and prescription medicines only as told by your health care provider. Medicines reduce the workload of your heart, slow the progression of heart failure, and improve symptoms. Take your medicines every day.  Do not stop taking your medicine unless your health care provider tells you to do so.  Do not skip any dose of medicine.  Refill your prescriptions before you run out of medicine. Eating and drinking   Eat heart-healthy foods. Talk with a dietitian to make an eating plan that is right for you. ? Choose foods that contain no trans fat and are low in saturated fat and cholesterol. Healthy choices include fresh or frozen fruits and vegetables, fish, lean meats, legumes, fat-free or low-fat dairy products, and whole-grain or high-fiber foods. ? Limit salt (sodium) if told by your health care provider. Sodium restriction may reduce symptoms of heart failure. Ask a dietitian to recommend heart-healthy seasonings. ? Use healthy cooking methods instead of frying. Healthy methods  include roasting, grilling, broiling, baking, poaching, steaming, and stir-frying.  Limit your fluid intake, if directed by your health care provider. Fluid restriction may reduce symptoms of heart failure. Alcohol use  Do not drink alcohol if: ? Your health care provider tells you not to drink. ? Your heart was damaged by alcohol, or you have severe heart failure. ? You are pregnant, may be pregnant, or are planning to become pregnant.  If you drink alcohol: ? Limit how much you use to:  0-1 drink a day for women.  0-2 drinks a day for men. ? Be aware of how much alcohol is in your drink. In the [place] one drink equals one 12 oz bottle of beer (355 mL), one 5 oz glass of wine (148 mL), or one 1 oz glass of hard liquor (44 mL). Lifestyle   Do not use any products that contain nicotine or tobacco, such as cigarettes, e-cigarettes, and chewing tobacco. If you need help quitting, ask your health care provider. ? Do not use nicotine gum or patches before talking to your health care provider.  Do not use illegal drugs.  Work with your health care provider to safely reach the right body weight.  Do physical activity if told by your health care provider. Talk to your health care provider before you begin an exercise if: ? You are an older adult. ? You have severe heart failure.  Learn to manage stress. If you need help to do this, ask your health care provider.  Participate in or seek rehabilitation as needed to keep or improve your independence and quality of life.  Plan   rest periods when you get tired. Monitoring important information   Weigh yourself every day. This will help you to notice if too much fluid is building up in your body. ? Weigh yourself every morning after you urinate and before you eat breakfast. ? Wear the same amount of clothing each time you weigh yourself. ? Record your daily weight. Provide your health care provider with your weight record.  Monitor and  record your pulse and blood pressure as told by your health care provider. Dealing with extreme temperatures  If the weather is extremely hot: ? Avoid vigorous physical activity. ? Use air conditioning or fans, or find a cooler location. ? Avoid caffeine and alcohol. ? Wear loose-fitting, lightweight, and light-colored clothing.  If the weather is extremely cold: ? Avoid vigorous activity. ? Layer your clothes. ? Wear mittens or gloves, a hat, and a scarf when you go outside. ? Avoid alcohol. Follow these instructions at home:  Stay up to date with vaccines. Pneumococcal and flu (influenza) vaccines are especially important in preventing infections of the airways.  Keep all follow-up visits as told by your health care provider. This is important. Contact a health care provider if you:  Have a rapid weight gain.  Have increasing shortness of breath.  Are unable to participate in your usual physical activities.  Get tired easily.  Cough more than normal, especially with physical activity.  Lose your appetite or feel nauseous.  Have any swelling or more swelling in areas such as your hands, feet, ankles, or abdomen.  Are unable to sleep because it is hard to breathe.  Feel like your heart is beating quickly (palpitations).  Become dizzy or light-headed when you stand up. Get help right away if you:  Have trouble breathing.  Notice or your family notices a change in your awareness, such as having trouble staying awake or concentrating.  Have pain or discomfort in your chest.  Have an episode of fainting (syncope). These symptoms may represent a serious problem that is an emergency. Do not wait to see if the symptoms will go away. Get medical help right away. Call your local emergency services (911 in the [place] Do not drive yourself to the hospital. Summary  Heart failure is a serious condition. To care for yourself, you may be asked to change your diet, take certain  medicines, and make other lifestyle changes.  Take your medicines every day. Do not stop taking them unless your health care provider tells you to do so.  Eat heart-healthy foods, such as fresh or frozen fruits and vegetables, fish, lean meats, legumes, fat-free or low-fat dairy products, and whole-grain or high-fiber foods.  Ask your health care provider if you have any alcohol restrictions. You may have to stop drinking alcohol if you have severe heart failure.  Contact your health care provider if you notice problems, such as rapid weight gain or a fast heartbeat. Get help right away if you faint, or have chest pain or trouble breathing. This information is not intended to replace advice given to you by your health care provider. Make sure you discuss any questions you have with your health care provider. Document Revised: 04/24/2018 Document Reviewed: 04/25/2018 [employer] Patient Education  2020 [employer]  

## 2019-07-15 NOTE — Care Management Important Message (Signed)
Important Message  Patient Details  Name: Cynthia [name] MRN: [id number] Date of Birth: Dec 09, 1940   Medicare Important Message Given:  Yes     Olegario Messier A Paxon Propes 07/15/2019, 1:16 PM

## 2019-07-15 NOTE — Discharge Summary (Signed)
Physician Discharge Summary  Cynthia [name] Cynthia [name] DOB: [birth year] DOA: 07/11/2019  PCP: System, Pcp Not In  Admit date: 07/11/2019 Discharge date: 07/15/2019  Admitted From: Home Disposition:  Home/PACE  Recommendations for Outpatient Follow-up:  1. Follow up with PCP in 1-2 weeks 2.   Home Health:No Equipment/Devices:None Discharge Condition:Stable CODE STATUS:DNR Diet recommendation: Heart Healthy / Carb Modified Brief/Interim Summary: Cynthia [name] a 79 y.o.femalewith medical history significant ofdementia, coronary artery disease, with history of CABG, hyperlipidemia, hypertension, who was at the [facility] today and sustained a fall with altered mental status. She was found to be dehydrated and weak. She was sent over to the ER for evaluation. Patient unable to give any history. Patient's husband said symptoms started about 3 AM last night where he had her fall off the couch. Since then she has been having left knee pain. She went to the clinic today where they noted the confusion. She is demented to begin with but this is worse. Sent over to the ER where she remains confused in the morning. No fever no chills no nausea vomiting or diarrhea. Patient noted to have possible UTI also. She is being admitted therefore for further evaluation and treatment.  6/18: Patient seen and examined.  Arm extremely agitated.  Tearful, crying out.  Unable to redirect.  Upon repeat prompting was able to get the minimal history from her.  She states she tripped on her husband's foot and sustained a fall.  No clinical indicators of infection.  All antibiotics stopped.  6/19: Patient seen and examined.  Much calmer this morning, but still tearful.  Kidney function improving but not at baseline.  Encephalopathy is persistent  6/20: Patient seen and examined.  Appears somewhat lethargic this morning.  Apparently per bedside nurse the patient only slept 1 or 2 hours  last night.  Was agitated despite administration of IM Haldol and IV Ativan in addition to home psychiatric regimen.  6/21: Patient seen and examined on the day of discharge.  Today she is very calm.  She is answering my questions appropriately.  She is ecstatic Cynthia Hai happy when I told her she could leave the hospital today.  During hospital stay we have ruled out infection.  Patient has had significant sundowning and hospital-acquired delirium requiring administration of antipsychotic medications.  However this morning she is calm and pleasant and I believe this is her baseline mental status.  I would use caution in the future when deciding to bring this patient to the hospital.  Her mental status is clearly very tenuous given her underlying dementia.  This occasion she was brought to the hospital for encephalopathy of unclear etiology.  No clear metabolic etiology was found.  My suspicion is that she may have suffered a concussion given the fairly large scalp contusion.  Discharge Diagnoses:  Principal Problem:   AMS (altered mental status) Active Problems:   Dehydration   HTN (hypertension)   HLD (hyperlipidemia)   GERD (gastroesophageal reflux disease)   CAD (coronary artery disease)   COPD exacerbation (HCC)   CHF (congestive heart failure) (HCC)   Acute lower UTI  Acute metabolic encephalopathy Unclear etiology, likely multifactorial Dehydration, mechanical fall Suspect element of concussion Appears to be improving over interval but not at baseline Per outpatient provider The patient does have some history of dementia but is usually calm and communicative - Patient has some significant hospital-acquired delirium and sundowning want so  Plan: Frequent reorienting measures Frequent neuro checks As needed Haldol As needed  zyprexa Home regimen  Diastolic congestive heart failure Volume status appears euvolemic Continue home diuretics  Acute hypoxic respiratory failure Patient  titrated to room air Wheezing resolved Medication continue steroids Continue home bronchodilator regimen  Urinary tract infection, ruled out Negative urinalysis Negative procalcitonin No fever elevated white count  COPD Respiratory status appears baseline Continue as needed nebulizers  Coronary artery disease Continue home medications  Hyperlipidemia Statin  GERD PPI  Hypertension Continue home medications  Depression Continue Cymbalta   Discharge Instructions  Discharge Instructions    Diet - low sodium heart healthy   Complete by: As directed    Increase activity slowly   Complete by: As directed      Allergies as of 07/15/2019      Reactions   Penicillins Swelling, Other (See Comments)   Has patient had a PCN reaction causing immediate rash, facial/tongue/throat swelling, SOB or lightheadedness with hypotension: No Has patient had a PCN reaction causing severe rash involving mucus membranes or skin necrosis: No Has patient had a PCN reaction that required hospitalization: No Has patient had a PCN reaction occurring within the last 10 years: No If all of the above answers are "NO", then may proceed with Cephalosporin use.     Levaquin [levofloxacin] Itching      Medication List    TAKE these medications   albuterol 108 (90 Base) MCG/ACT inhaler Commonly known as: VENTOLIN HFA Inhale 2 puffs into the lungs every 6 (six) hours as needed for wheezing or shortness of breath.   aspirin EC 81 MG tablet Take 81 mg by mouth daily.   calcium carbonate 500 MG chewable tablet Commonly known as: TUMS - dosed in mg elemental calcium Chew 2 tablets by mouth every 4 (four) hours as needed for indigestion or heartburn.   Desitin 13 % Crea Generic drug: Zinc Oxide Apply topically 3 (three) times daily. After toileting   DULoxetine 60 MG capsule Commonly known as: CYMBALTA Take 60 mg by mouth daily.   furosemide 80 MG tablet Commonly known as:  LASIX Take 80 mg by mouth daily.   furosemide 20 MG tablet Commonly known as: LASIX Take 20 mg by mouth daily as needed for fluid. Take only as directed   gabapentin 300 MG capsule Commonly known as: NEURONTIN Take 300 mg by mouth at bedtime.   ipratropium-albuterol 0.5-2.5 (3) MG/3ML Soln Commonly known as: DUONEB Take 3 mLs by nebulization every 4 (four) hours as needed (for shortness of breath/wheezing).   isosorbide mononitrate 30 MG 24 hr tablet Commonly known as: IMDUR Take 30 mg by mouth daily.   latanoprost 0.005 % ophthalmic solution Commonly known as: XALATAN Place 1 drop into both eyes at bedtime.   LORazepam 2 MG/ML concentrated solution Commonly known as: ATIVAN Take 0.5 mg by mouth every 2 (two) hours as needed for anxiety (agitation).   LORazepam 0.5 MG tablet Commonly known as: ATIVAN Take 0.5 mg by mouth at bedtime.   morphine CONCENTRATE 10 mg / 0.5 ml concentrated solution Place 4 mg under the tongue every 2 (two) hours as needed for severe pain.   nitroGLYCERIN 0.4 MG SL tablet Commonly known as: NITROSTAT Place 0.4 mg under the tongue every 5 (five) minutes as needed for chest pain.   ondansetron 4 MG disintegrating tablet Commonly known as: ZOFRAN-ODT Take 4 mg by mouth every 8 (eight) hours as needed for nausea or vomiting.   oxyCODONE-acetaminophen 5-325 MG tablet Commonly known as: PERCOCET/ROXICET Take 1 tablet by mouth in the  morning and at bedtime.   polyethylene glycol 17 g packet Commonly known as: MIRALAX / GLYCOLAX Take 17 g by mouth daily.   potassium chloride SA 20 MEQ tablet Commonly known as: KLOR-CON Take 20 mEq by mouth daily.   QUEtiapine 25 MG tablet Commonly known as: SEROQUEL Take 25 mg by mouth at bedtime.   salmeterol 50 MCG/DOSE diskus inhaler Commonly known as: SEREVENT Inhale 1 puff into the lungs 2 (two) times daily.   scopolamine 1 MG/3DAYS Commonly known as: TRANSDERM-SCOP Place 1 patch onto the skin  every 3 (three) days as needed (oral secretions).   sennosides-docusate sodium 8.6-50 MG tablet Commonly known as: SENOKOT-S Take 1 tablet by mouth daily.   trolamine salicylate 10 % cream Commonly known as: ASPERCREME Apply 1 application topically 2 (two) times daily as needed for muscle pain.       Allergies  Allergen Reactions  . Penicillins Swelling and Other (See Comments)    Has patient had a PCN reaction causing immediate rash, facial/tongue/throat swelling, SOB or lightheadedness with hypotension: No Has patient had a PCN reaction causing severe rash involving mucus membranes or skin necrosis: No Has patient had a PCN reaction that required hospitalization: No Has patient had a PCN reaction occurring within the last 10 years: No If all of the above answers are "NO", then may proceed with Cephalosporin use.    . Levaquin [Levofloxacin] Itching    Consultations:  None   Procedures/Studies: CT Head Wo Contrast  Result Date: 07/11/2019 CLINICAL DATA:  Fall EXAM: CT HEAD WITHOUT CONTRAST CT CERVICAL SPINE WITHOUT CONTRAST TECHNIQUE: Multidetector CT imaging of the head and cervical spine was performed following the standard protocol without intravenous contrast. Multiplanar CT image reconstructions of the cervical spine were also generated. COMPARISON:  CT head 11/14/2017, CT cervical spine 05/16/2013 FINDINGS: CT HEAD FINDINGS Brain: No evidence of acute infarction, hemorrhage, hydrocephalus, extra-axial collection or mass lesion/mass effect. Symmetric prominence of the ventricles, cisterns and sulci compatible with parenchymal volume loss. Patchy areas of white matter hypoattenuation are most compatible with chronic microvascular angiopathy. Benign dural calcifications are similar to priors. Vascular: Atherosclerotic calcification of the carotid siphons. No hyperdense vessel. Skull: Frontal midline scalp swelling with crescentic hematoma measuring up to 6 mm in maximal  thickness. No subjacent calvarial fracture or suspicious osseous lesions. No visible facial bone fracture within the included levels of imaging. Sinuses/Orbits: Paranasal sinuses are predominantly clear. Right mastoid air cells are clear. Trace left mastoid effusion. No visible or suspected temporal bone fracture. Orbital structures are unremarkable aside from prior lens extractions. Other: Edentulous with mild mandibular prognathism. CT CERVICAL SPINE FINDINGS Alignment: Stabilization collar absent at time of examination. There is mild straightening of the normal cervical lordosis. There is fairly pronounced rightward cranial rotation. No evidence of traumatic listhesis. No abnormally widened, perched or jumped facets. Normal alignment of the craniocervical and atlantoaxial articulations accounting for the degree of rotation. Skull base and vertebrae: Moderate arthrosis at the atlantodental interval hypertrophic spurring. Multilevel spondylitic changes are present in the cervical vertebral bodies as well as some sclerotic and cystic facet degenerative changes as well. No visible skull base fracture. No vertebral body fracture or height loss is seen. No worrisome osseous lesions. Soft tissues and spinal canal: No pre or paravertebral fluid or swelling. No visible canal hematoma.There are extensive postsurgical changes in the left neck. Disc levels: Multilevel diffuse intervertebral disc height loss with corresponding spondylitic and facet degenerative changes in the spine. Posterior disc osteophyte complexes present  at C4-5 and C5-6 result in at most mild canal narrowing. Some uncinate spurring and facet hypertrophic changes result in some mild multilevel neural foraminal narrowing with more moderate narrowing on the left at C3-4. Upper chest: Right apical lung hernia extending through the thoracic inlet. Extensive respiratory motion artifact limits the evaluation of the lung parenchyma. Other: 1.7 cm  hypoattenuating nodule in the posterior right lobe thyroid gland. IMPRESSION: 1. Frontal midline scalp swelling with crescentic hematoma measuring up to 6 mm in maximal thickness. No subjacent calvarial fracture or acute intracranial abnormality. 2. Background of chronic microvascular angiopathy and parenchymal volume loss. 3. No evidence of acute fracture or traumatic listhesis of the cervical spine. 4. Multilevel spondylitic and facet degenerative changes of the cervical spine as described above. 5. Right apical lung hernia extending through the thoracic inlet. 6. 1.7 cm hypoattenuating nodule in the posterior right lobe thyroid gland. Consider further evaluation with nonemergent thyroid ultrasound. This follows consensus guidelines: Managing Incidental Thyroid Nodules Detected on Imaging: White Paper of the ACR Incidental Thyroid Findings Committee. J Am Coll Radiol 2015; 12:143-150. and [facility] 3-tiered system for managing ITNs: J Am Coll Radiol. 2015; Feb;12(2): 143-50 Electronically Signed   By: Kreg ShropshirePrice  DeHay M.D.   On: 07/11/2019 16:33   CT Cervical Spine Wo Contrast  Result Date: 07/11/2019 CLINICAL DATA:  Fall EXAM: CT HEAD WITHOUT CONTRAST CT CERVICAL SPINE WITHOUT CONTRAST TECHNIQUE: Multidetector CT imaging of the head and cervical spine was performed following the standard protocol without intravenous contrast. Multiplanar CT image reconstructions of the cervical spine were also generated. COMPARISON:  CT head 11/14/2017, CT cervical spine 05/16/2013 FINDINGS: CT HEAD FINDINGS Brain: No evidence of acute infarction, hemorrhage, hydrocephalus, extra-axial collection or mass lesion/mass effect. Symmetric prominence of the ventricles, cisterns and sulci compatible with parenchymal volume loss. Patchy areas of white matter hypoattenuation are most compatible with chronic microvascular angiopathy. Benign dural calcifications are similar to priors. Vascular: Atherosclerotic calcification of the carotid  siphons. No hyperdense vessel. Skull: Frontal midline scalp swelling with crescentic hematoma measuring up to 6 mm in maximal thickness. No subjacent calvarial fracture or suspicious osseous lesions. No visible facial bone fracture within the included levels of imaging. Sinuses/Orbits: Paranasal sinuses are predominantly clear. Right mastoid air cells are clear. Trace left mastoid effusion. No visible or suspected temporal bone fracture. Orbital structures are unremarkable aside from prior lens extractions. Other: Edentulous with mild mandibular prognathism. CT CERVICAL SPINE FINDINGS Alignment: Stabilization collar absent at time of examination. There is mild straightening of the normal cervical lordosis. There is fairly pronounced rightward cranial rotation. No evidence of traumatic listhesis. No abnormally widened, perched or jumped facets. Normal alignment of the craniocervical and atlantoaxial articulations accounting for the degree of rotation. Skull base and vertebrae: Moderate arthrosis at the atlantodental interval hypertrophic spurring. Multilevel spondylitic changes are present in the cervical vertebral bodies as well as some sclerotic and cystic facet degenerative changes as well. No visible skull base fracture. No vertebral body fracture or height loss is seen. No worrisome osseous lesions. Soft tissues and spinal canal: No pre or paravertebral fluid or swelling. No visible canal hematoma.There are extensive postsurgical changes in the left neck. Disc levels: Multilevel diffuse intervertebral disc height loss with corresponding spondylitic and facet degenerative changes in the spine. Posterior disc osteophyte complexes present at C4-5 and C5-6 result in at most mild canal narrowing. Some uncinate spurring and facet hypertrophic changes result in some mild multilevel neural foraminal narrowing with more moderate narrowing on the left  at C3-4. Upper chest: Right apical lung hernia extending through the  thoracic inlet. Extensive respiratory motion artifact limits the evaluation of the lung parenchyma. Other: 1.7 cm hypoattenuating nodule in the posterior right lobe thyroid gland. IMPRESSION: 1. Frontal midline scalp swelling with crescentic hematoma measuring up to 6 mm in maximal thickness. No subjacent calvarial fracture or acute intracranial abnormality. 2. Background of chronic microvascular angiopathy and parenchymal volume loss. 3. No evidence of acute fracture or traumatic listhesis of the cervical spine. 4. Multilevel spondylitic and facet degenerative changes of the cervical spine as described above. 5. Right apical lung hernia extending through the thoracic inlet. 6. 1.7 cm hypoattenuating nodule in the posterior right lobe thyroid gland. Consider further evaluation with nonemergent thyroid ultrasound. This follows consensus guidelines: Managing Incidental Thyroid Nodules Detected on Imaging: White Paper of the ACR Incidental Thyroid Findings Committee. J Am Coll Radiol 2015; 12:143-150. and [facility] 3-tiered system for managing ITNs: J Am Coll Radiol. 2015; Feb;12(2): 143-50 Electronically Signed   By: Kreg Shropshire M.D.   On: 07/11/2019 16:33   DG Chest Portable 1 View  Result Date: 07/11/2019 CLINICAL DATA:  Hypoxia, fell, dementia EXAM: PORTABLE CHEST 1 VIEW COMPARISON:  07/16/2018 FINDINGS: Single frontal view of the chest demonstrates an enlarged cardiac silhouette. Stable postsurgical changes from median sternotomy. There is mild central vascular congestion without airspace disease, effusion, or pneumothorax. No acute displaced fracture. IMPRESSION: 1. Central vascular congestion.  No acute airspace disease. Electronically Signed   By: Sharlet Salina M.D.   On: 07/11/2019 16:14    (Echo, Carotid, EGD, Colonoscopy, ERCP)    Subjective: Patient seen and examined on day of discharge.  Mental status much improved.  Patient stable for discharge back home in care of pace services  Discharge  Exam: Vitals:   07/15/19 0431 07/15/19 1140  BP: (!) 164/92 (!) 146/80  Pulse: 73 72  Resp: 20 19  Temp: 98.1 F (36.7 C) 99.3 F (37.4 C)  SpO2: 95% 96%   Vitals:   07/14/19 1959 07/14/19 2139 07/15/19 0431 07/15/19 1140  BP:  119/85 (!) 164/92 (!) 146/80  Pulse:  76 73 72  Resp:  20 20 19   Temp:  98.1 F (36.7 C) 98.1 F (36.7 C) 99.3 F (37.4 C)  TempSrc:  Oral Oral Oral  SpO2: 96% 92% 95% 96%  Weight:      Height:        General: Pt is alert, awake, not in acute distress Cardiovascular: RRR, S1/S2 +, no rubs, no gallops Respiratory: CTA bilaterally, no wheezing, no rhonchi Abdominal: Soft, NT, ND, bowel sounds + Extremities: no edema, no cyanosis    The results of significant diagnostics from this hospitalization (including imaging, microbiology, ancillary and laboratory) are listed below for reference.     Microbiology: Recent Results (from the past 240 hour(s))  Urine Culture     Status: Abnormal   Collection Time: 07/11/19  3:55 PM   Specimen: Urine, Random  Result Value Ref Range Status   Specimen Description   Final    URINE, RANDOM Performed at [facility], [address], [place], [place]    Special Requests   Final    NONE Performed at [facility], [address] [place], [place]    Culture (A)  Final    >=100,000 COLONIES/mL AEROCOCCUS URINAE Standardized susceptibility testing for this organism is not available. Performed at [facility], [address], [place], [place]  Report Status 07/14/2019 FINAL  Final  SARS Coronavirus 2 by RT PCR (hospital order, performed in [facility] hospital lab) Nasopharyngeal Nasopharyngeal Swab     Status: None   Collection Time: 07/11/19 10:13 PM   Specimen: Nasopharyngeal Swab  Result Value Ref Range Status   SARS Coronavirus 2 NEGATIVE NEGATIVE Final    Comment: (NOTE) SARS-CoV-2 target nucleic acids are NOT DETECTED.  The SARS-CoV-2 RNA is  generally detectable in upper and lower respiratory specimens during the acute phase of infection. The lowest concentration of SARS-CoV-2 viral copies this assay can detect is 250 copies / mL. A negative result does not preclude SARS-CoV-2 infection and should not be used as the sole basis for treatment or other patient management decisions.  A negative result may occur with improper specimen collection / handling, submission of specimen other than nasopharyngeal swab, presence of viral mutation(s) within the areas targeted by this assay, and inadequate number of viral copies (<250 copies / mL). A negative result must be combined with clinical observations, patient history, and epidemiological information.  Fact Sheet for Patients:   StrictlyIdeas.no  Fact Sheet for Healthcare Providers: BankingDealers.co.za  This test is not yet approved or  cleared by the [place] FDA and has been authorized for detection and/or diagnosis of SARS-CoV-2 by FDA under an Emergency Use Authorization (EUA).  This EUA will remain in effect (meaning this test can be used) for the duration of the COVID-19 declaration under Section 564(b)(1) of the Act, 21 U.S.C. section 360bbb-3(b)(1), unless the authorization is terminated or revoked sooner.  Performed at [facility], [place], [place] [address]   C Difficile Quick Screen w PCR reflex     Status: None   Collection Time: 07/14/19  2:00 PM   Specimen: STOOL  Result Value Ref Range Status   C Diff antigen NEGATIVE NEGATIVE Final   C Diff toxin NEGATIVE NEGATIVE Final   C Diff interpretation No C. difficile detected.  Final    Comment: Performed at [facility], [address] [place], [place] [address]     Labs: BNP (last 3 results) Recent Labs    07/11/19 1555  BNP 867.6*   Basic Metabolic Panel: Recent Labs  Lab 07/11/19 1555 07/12/19 0320  07/13/19 0502 07/14/19 0620 07/15/19 0753  NA 141 143 140 141 140  K 4.2 4.1 3.7 3.8 3.7  CL 99 99 98 98 100  CO2 31 33* 29 29 29   GLUCOSE 95 86 102* 112* 113*  BUN 30* 30* 30* 27* 26*  CREATININE 1.53* 1.48* 1.46* 1.40* 1.49*  CALCIUM 9.0 8.9 9.5 10.0 9.6   Liver Function Tests: No results for input(s): AST, ALT, ALKPHOS, BILITOT, PROT, ALBUMIN in the last 168 hours. No results for input(s): LIPASE, AMYLASE in the last 168 hours. No results for input(s): AMMONIA in the last 168 hours. CBC: Recent Labs  Lab 07/11/19 1555  WBC 4.0  NEUTROABS 2.4  HGB 11.9*  HCT 37.5  MCV 105.6*  PLT 156   Cardiac Enzymes: No results for input(s): CKTOTAL, CKMB, CKMBINDEX, TROPONINI in the last 168 hours. BNP: Invalid input(s): POCBNP CBG: No results for input(s): GLUCAP in the last 168 hours. D-Dimer No results for input(s): DDIMER in the last 72 hours. Hgb A1c No results for input(s): HGBA1C in the last 72 hours. Lipid Profile No results for input(s): CHOL, HDL, LDLCALC, TRIG, CHOLHDL, LDLDIRECT in the last 72 hours. Thyroid function studies No results for input(s): TSH, T4TOTAL, T3FREE,  THYROIDAB in the last 72 hours.  Invalid input(s): FREET3 Anemia work up No results for input(s): VITAMINB12, FOLATE, FERRITIN, TIBC, IRON, RETICCTPCT in the last 72 hours. Urinalysis    Component Value Date/Time   COLORURINE YELLOW (A) 07/11/2019 1555   APPEARANCEUR CLEAR (A) 07/11/2019 1555   APPEARANCEUR Hazy 06/08/2013 0721   LABSPEC 1.009 07/11/2019 1555   LABSPEC 1.024 06/08/2013 0721   PHURINE 5.0 07/11/2019 1555   GLUCOSEU NEGATIVE 07/11/2019 1555   GLUCOSEU Negative 06/08/2013 0721   HGBUR NEGATIVE 07/11/2019 1555   BILIRUBINUR NEGATIVE 07/11/2019 1555   BILIRUBINUR Negative 06/08/2013 0721   KETONESUR NEGATIVE 07/11/2019 1555   PROTEINUR NEGATIVE 07/11/2019 1555   NITRITE NEGATIVE 07/11/2019 1555   LEUKOCYTESUR TRACE (A) 07/11/2019 1555   LEUKOCYTESUR 3+ 06/08/2013 0721    Sepsis Labs Invalid input(s): PROCALCITONIN,  WBC,  LACTICIDVEN Microbiology Recent Results (from the past 240 hour(s))  Urine Culture     Status: Abnormal   Collection Time: 07/11/19  3:55 PM   Specimen: Urine, Random  Result Value Ref Range Status   Specimen Description   Final    URINE, RANDOM Performed at [facility], [address], [place], [place] [address]    Special Requests   Final    NONE Performed at [facility], [address] [place], [place] [address]    Culture (A)  Final    >=100,000 COLONIES/mL AEROCOCCUS URINAE Standardized susceptibility testing for this organism is not available. Performed at [facility], [address], [place], [place] [address]    Report Status 07/14/2019 FINAL  Final  SARS Coronavirus 2 by RT PCR (hospital order, performed in [facility] hospital lab) Nasopharyngeal Nasopharyngeal Swab     Status: None   Collection Time: 07/11/19 10:13 PM   Specimen: Nasopharyngeal Swab  Result Value Ref Range Status   SARS Coronavirus 2 NEGATIVE NEGATIVE Final    Comment: (NOTE) SARS-CoV-2 target nucleic acids are NOT DETECTED.  The SARS-CoV-2 RNA is generally detectable in upper and lower respiratory specimens during the acute phase of infection. The lowest concentration of SARS-CoV-2 viral copies this assay can detect is 250 copies / mL. A negative result does not preclude SARS-CoV-2 infection and should not be used as the sole basis for treatment or other patient management decisions.  A negative result may occur with improper specimen collection / handling, submission of specimen other than nasopharyngeal swab, presence of viral mutation(s) within the areas targeted by this assay, and inadequate number of viral copies (<250 copies / mL). A negative result must be combined with clinical observations, patient history, and epidemiological information.  Fact Sheet for Patients:    BoilerBrush.com.cy  Fact Sheet for Healthcare Providers: https://pope.com/  This test is not yet approved or  cleared by the [place] FDA and has been authorized for detection and/or diagnosis of SARS-CoV-2 by FDA under an Emergency Use Authorization (EUA).  This EUA will remain in effect (meaning this test can be used) for the duration of the COVID-19 declaration under Section 564(b)(1) of the Act, 21 U.S.C. section 360bbb-3(b)(1), unless the authorization is terminated or revoked sooner.  Performed at [facility], [address] [place], [place] [address]   C Difficile Quick Screen w PCR reflex     Status: None   Collection Time: 07/14/19  2:00 PM   Specimen: STOOL  Result Value Ref Range Status   C Diff antigen NEGATIVE NEGATIVE Final   C Diff toxin NEGATIVE NEGATIVE Final  C Diff interpretation No C. difficile detected.  Final    Comment: Performed at [facility], [address], [place], [place] [address]     Time coordinating discharge: Over 30 minutes  SIGNED:   Tresa Moore, MD  [facility] 07/15/2019, 1:46 PM Pager   If 7PM-7AM, please contact night-coverage

## 2019-08-25 DEATH — deceased
